# Patient Record
Sex: Female | Born: 1980
Health system: Southern US, Community
[De-identification: ages and names within clinical notes are randomized; demographics above are authoritative.]

## PROBLEM LIST (undated history)

## (undated) DIAGNOSIS — T7840XA Allergy, unspecified, initial encounter: Secondary | ICD-10-CM

## (undated) DIAGNOSIS — Z8489 Family history of other specified conditions: Secondary | ICD-10-CM

## (undated) DIAGNOSIS — K449 Diaphragmatic hernia without obstruction or gangrene: Secondary | ICD-10-CM

## (undated) DIAGNOSIS — I10 Essential (primary) hypertension: Secondary | ICD-10-CM

## (undated) DIAGNOSIS — M1990 Unspecified osteoarthritis, unspecified site: Secondary | ICD-10-CM

## (undated) DIAGNOSIS — F419 Anxiety disorder, unspecified: Secondary | ICD-10-CM

## (undated) HISTORY — DX: Unspecified osteoarthritis, unspecified site: M19.90

## (undated) HISTORY — PX: TONSILLECTOMY: SUR1361

## (undated) HISTORY — PX: NASAL SINUS SURGERY: SHX719

## (undated) HISTORY — DX: Anxiety disorder, unspecified: F41.9

## (undated) HISTORY — DX: Allergy, unspecified, initial encounter: T78.40XA

## (undated) HISTORY — PX: LEEP: SHX91

---

## 2007-07-14 ENCOUNTER — Emergency Department: Payer: Self-pay | Admitting: Emergency Medicine

## 2009-11-13 ENCOUNTER — Other Ambulatory Visit: Admission: RE | Admit: 2009-11-13 | Discharge: 2009-11-13 | Payer: Self-pay | Admitting: Obstetrics & Gynecology

## 2011-01-18 ENCOUNTER — Encounter: Payer: Self-pay | Admitting: *Deleted

## 2011-01-18 DIAGNOSIS — Y92009 Unspecified place in unspecified non-institutional (private) residence as the place of occurrence of the external cause: Secondary | ICD-10-CM | POA: Insufficient documentation

## 2011-01-18 DIAGNOSIS — W108XXA Fall (on) (from) other stairs and steps, initial encounter: Secondary | ICD-10-CM | POA: Insufficient documentation

## 2011-01-18 DIAGNOSIS — F17200 Nicotine dependence, unspecified, uncomplicated: Secondary | ICD-10-CM | POA: Insufficient documentation

## 2011-01-18 DIAGNOSIS — I10 Essential (primary) hypertension: Secondary | ICD-10-CM | POA: Insufficient documentation

## 2011-01-18 DIAGNOSIS — T07XXXA Unspecified multiple injuries, initial encounter: Secondary | ICD-10-CM | POA: Insufficient documentation

## 2011-01-18 DIAGNOSIS — R51 Headache: Secondary | ICD-10-CM | POA: Insufficient documentation

## 2011-01-18 DIAGNOSIS — K449 Diaphragmatic hernia without obstruction or gangrene: Secondary | ICD-10-CM | POA: Insufficient documentation

## 2011-01-18 DIAGNOSIS — H5789 Other specified disorders of eye and adnexa: Secondary | ICD-10-CM | POA: Insufficient documentation

## 2011-01-18 NOTE — ED Notes (Signed)
Pt fell down stairs while carrying laundry. Pt hit right side of face, now c/o headache and facial pain. Pt right eye swollen and bruised.

## 2011-01-19 ENCOUNTER — Emergency Department (HOSPITAL_COMMUNITY)
Admission: EM | Admit: 2011-01-19 | Discharge: 2011-01-19 | Disposition: A | Discharge status: Home / Self Care | Payer: 59 | Attending: Emergency Medicine | Admitting: Emergency Medicine

## 2011-01-19 ENCOUNTER — Emergency Department (HOSPITAL_COMMUNITY): Payer: 59

## 2011-01-19 DIAGNOSIS — W19XXXA Unspecified fall, initial encounter: Secondary | ICD-10-CM

## 2011-01-19 HISTORY — DX: Essential (primary) hypertension: I10

## 2011-01-19 HISTORY — DX: Diaphragmatic hernia without obstruction or gangrene: K44.9

## 2011-01-19 MED ORDER — IBUPROFEN 800 MG PO TABS: 800.0000 mg | ORAL_TABLET | Freq: Three times a day (TID) | ORAL | Status: AC

## 2011-01-19 MED ORDER — HYDROCODONE-ACETAMINOPHEN 5-325 MG PO TABS: 1.0000 | ORAL_TABLET | ORAL | Status: AC | PRN

## 2011-01-19 MED ORDER — IBUPROFEN 800 MG PO TABS
800.0000 mg | ORAL_TABLET | Freq: Once | ORAL | Status: AC
  Administered 2011-01-19: 800 mg via ORAL
  Filled 2011-01-19: qty 1

## 2011-01-19 MED ORDER — HYDROCODONE-ACETAMINOPHEN 5-325 MG PO TABS
1.0000 | ORAL_TABLET | Freq: Once | ORAL | Status: AC
  Administered 2011-01-19: 1 via ORAL
  Filled 2011-01-19: qty 1

## 2011-01-19 NOTE — ED Notes (Signed)
Pt left the ed stating no needs

## 2011-01-19 NOTE — ED Provider Notes (Signed)
History     CSN: [id number] Arrival date & time: 01/19/2011 12:15 AM   First MD Initiated Contact with Patient 01/19/11 0153      Chief Complaint  Patient presents with  . Eye Injury  . Fall  . Headache    (Consider location/radiation/quality/duration/timing/severity/associated sxs/prior treatment) HPI Comments: Seen 39  Patient is a 30 y.o. female presenting with fall. The history is provided by the patient.  Fall The accident occurred 3 to 5 hours ago (Patient fell going down the stairs carrying a laundry basket. Struck the floor. No LOC. ). Incident: walking down stairs. She fell from a height of 6 to 10 ft. She landed on a hard floor. There was no blood loss. The point of impact was the head. The pain is present in the head (right eye and eyebrow). The pain is at a severity of 5/10. The pain is moderate. She was ambulatory at the scene. There was no entrapment after the fall. There was no drug use involved in the accident. There was no alcohol use involved in the accident. Pertinent negatives include no visual change, no numbness, no nausea, no headaches, no hearing loss, no loss of consciousness and no tingling. She has tried ice for the symptoms. The treatment provided no relief.    Past Medical History  Diagnosis Date  . Hypertension   . Hiatal hernia     Past Surgical History  Procedure Date  . Nasal sinus surgery   . Tonsillectomy     Family History  Problem Relation Age of Onset  . Hypertension Mother     History  Substance Use Topics  . Smoking status: Current Everyday Smoker  . Smokeless tobacco: Not on file  . Alcohol Use: Yes     occ    OB History    Grav Para Term Preterm Abortions TAB SAB Ect Mult Living                  Review of Systems  HENT: Positive for facial swelling.        Right eye swollen shut  Gastrointestinal: Negative for nausea.  Neurological: Negative for tingling, loss of consciousness, numbness and headaches.  All other  systems reviewed and are negative.    Allergies  Review of patient's allergies indicates no known allergies.  Home Medications  No current outpatient prescriptions on file.  BP 121/84  Pulse 93  Temp(Src) 98.1 F (36.7 C) (Oral)  Resp 20  Ht 5\' 2"  (1.575 m)  Wt 192 lb (87.091 kg)  BMI 35.12 kg/m2  SpO2 100%  LMP 01/13/2011  Physical Exam  Nursing note and vitals reviewed. Constitutional: She is oriented to person, place, and time. She appears well-developed and well-nourished.  HENT:  Head: Normocephalic.  Right Ear: External ear normal.  Left Ear: External ear normal.  Nose: Nose normal.  Mouth/Throat: Oropharynx is clear and moist.  Eyes: EOM are normal. Pupils are equal, round, and reactive to light.       Swelling to right periorbital area and both upper and lower lids. Bruising to eyebrow. No entrapment. Visual fields intact. No bony abnormalities, no step off, no crepitus.  Neck: Normal range of motion. Neck supple.       No soft tissue tenderness  Cardiovascular: Normal rate, normal heart sounds and intact distal pulses.   Pulmonary/Chest: Effort normal and breath sounds normal.  Abdominal: Soft. Bowel sounds are normal.  Musculoskeletal: Normal range of motion.  Lymphadenopathy:    She has  no cervical adenopathy.  Neurological: She is alert and oriented to person, place, and time. She has normal reflexes.  Skin: Skin is warm and dry.    ED Course  Procedures (including critical care time)  Labs Reviewed - No data to display Ct Head Wo Contrast  01/19/2011  *RADIOLOGY REPORT*  Clinical Data:  Status post fall, with blunt trauma to the right eye; bruising and swelling about the right eye, headache and bilateral neck pain.  CT HEAD WITHOUT CONTRAST CT MAXILLOFACIAL WITHOUT CONTRAST CT CERVICAL SPINE WITHOUT CONTRAST  Technique:  Multidetector CT imaging of the head, cervical spine, and maxillofacial structures were performed using the standard protocol without  intravenous contrast. Multiplanar CT image reconstructions of the cervical spine and maxillofacial structures were also generated.  Comparison:  None  CT HEAD  Findings: There is no evidence of acute infarction, mass lesion, or intra- or extra-axial hemorrhage on CT.  The posterior fossa, including the cerebellum, brainstem and fourth ventricle, is within normal limits.  The third and lateral ventricles, and basal ganglia are unremarkable in appearance.  The cerebral hemispheres are symmetric in appearance, with normal gray- white differentiation.  No mass effect or midline shift is seen.  There is no evidence of fracture; visualized osseous structures are unremarkable in appearance.  The visualized portions of the orbits are within normal limits.  The paranasal sinuses and mastoid air cells are well-aerated.  Soft tissue swelling is noted about the right orbit.  IMPRESSION:  1.  No evidence of traumatic intracranial injury or fracture. 2.  Soft tissue swelling noted about the right orbit.  CT MAXILLOFACIAL  Findings:  There is no evidence of fracture or dislocation.  The maxilla and mandible appear intact.  The nasal bone is unremarkable in appearance.  The visualized dentition demonstrates no acute abnormality.  The orbits are intact bilaterally.  The paranasal sinuses and visualized mastoid air cells are clear.  Soft tissue swelling is noted about the right orbit.  The parapharyngeal fat planes are preserved.  The nasopharynx, oropharynx and hypopharynx are unremarkable in appearance.  The visualized portions of the valleculae and piriform sinuses are grossly unremarkable.  The parotid and submandibular glands are within normal limits.  No cervical lymphadenopathy is seen.  IMPRESSION:  1.  No evidence of fracture or dislocation. 2.  Soft tissue swelling noted about the right orbit.  CT CERVICAL SPINE  Findings:   There is no evidence of fracture or subluxation. Vertebral bodies demonstrate normal height and  alignment. Intervertebral disc spaces are preserved.  Prevertebral soft tissues are within normal limits.  The visualized neural foramina are grossly unremarkable.  The thyroid gland is unremarkable in appearance.  The visualized lung apices are clear.  No significant soft tissue abnormalities are seen.  IMPRESSION: No evidence of fracture or subluxation along the cervical spine.  Original Report Authenticated By: Tonia Ghent, M.D.   Ct Cervical Spine Wo Contrast  01/19/2011  *RADIOLOGY REPORT*  Clinical Data:  Status post fall, with blunt trauma to the right eye; bruising and swelling about the right eye, headache and bilateral neck pain.  CT HEAD WITHOUT CONTRAST CT MAXILLOFACIAL WITHOUT CONTRAST CT CERVICAL SPINE WITHOUT CONTRAST  Technique:  Multidetector CT imaging of the head, cervical spine, and maxillofacial structures were performed using the standard protocol without intravenous contrast. Multiplanar CT image reconstructions of the cervical spine and maxillofacial structures were also generated.  Comparison:  None  CT HEAD  Findings: There is no evidence of acute infarction, mass  lesion, or intra- or extra-axial hemorrhage on CT.  The posterior fossa, including the cerebellum, brainstem and fourth ventricle, is within normal limits.  The third and lateral ventricles, and basal ganglia are unremarkable in appearance.  The cerebral hemispheres are symmetric in appearance, with normal gray- white differentiation.  No mass effect or midline shift is seen.  There is no evidence of fracture; visualized osseous structures are unremarkable in appearance.  The visualized portions of the orbits are within normal limits.  The paranasal sinuses and mastoid air cells are well-aerated.  Soft tissue swelling is noted about the right orbit.  IMPRESSION:  1.  No evidence of traumatic intracranial injury or fracture. 2.  Soft tissue swelling noted about the right orbit.  CT MAXILLOFACIAL  Findings:  There is no  evidence of fracture or dislocation.  The maxilla and mandible appear intact.  The nasal bone is unremarkable in appearance.  The visualized dentition demonstrates no acute abnormality.  The orbits are intact bilaterally.  The paranasal sinuses and visualized mastoid air cells are clear.  Soft tissue swelling is noted about the right orbit.  The parapharyngeal fat planes are preserved.  The nasopharynx, oropharynx and hypopharynx are unremarkable in appearance.  The visualized portions of the valleculae and piriform sinuses are grossly unremarkable.  The parotid and submandibular glands are within normal limits.  No cervical lymphadenopathy is seen.  IMPRESSION:  1.  No evidence of fracture or dislocation. 2.  Soft tissue swelling noted about the right orbit.  CT CERVICAL SPINE  Findings:   There is no evidence of fracture or subluxation. Vertebral bodies demonstrate normal height and alignment. Intervertebral disc spaces are preserved.  Prevertebral soft tissues are within normal limits.  The visualized neural foramina are grossly unremarkable.  The thyroid gland is unremarkable in appearance.  The visualized lung apices are clear.  No significant soft tissue abnormalities are seen.  IMPRESSION: No evidence of fracture or subluxation along the cervical spine.  Original Report Authenticated By: Tonia Ghent, M.D.   Ct Maxillofacial Wo Cm  01/19/2011  *RADIOLOGY REPORT*  Clinical Data:  Status post fall, with blunt trauma to the right eye; bruising and swelling about the right eye, headache and bilateral neck pain.  CT HEAD WITHOUT CONTRAST CT MAXILLOFACIAL WITHOUT CONTRAST CT CERVICAL SPINE WITHOUT CONTRAST  Technique:  Multidetector CT imaging of the head, cervical spine, and maxillofacial structures were performed using the standard protocol without intravenous contrast. Multiplanar CT image reconstructions of the cervical spine and maxillofacial structures were also generated.  Comparison:  None  CT HEAD   Findings: There is no evidence of acute infarction, mass lesion, or intra- or extra-axial hemorrhage on CT.  The posterior fossa, including the cerebellum, brainstem and fourth ventricle, is within normal limits.  The third and lateral ventricles, and basal ganglia are unremarkable in appearance.  The cerebral hemispheres are symmetric in appearance, with normal gray- white differentiation.  No mass effect or midline shift is seen.  There is no evidence of fracture; visualized osseous structures are unremarkable in appearance.  The visualized portions of the orbits are within normal limits.  The paranasal sinuses and mastoid air cells are well-aerated.  Soft tissue swelling is noted about the right orbit.  IMPRESSION:  1.  No evidence of traumatic intracranial injury or fracture. 2.  Soft tissue swelling noted about the right orbit.  CT MAXILLOFACIAL  Findings:  There is no evidence of fracture or dislocation.  The maxilla and mandible appear intact.  The  nasal bone is unremarkable in appearance.  The visualized dentition demonstrates no acute abnormality.  The orbits are intact bilaterally.  The paranasal sinuses and visualized mastoid air cells are clear.  Soft tissue swelling is noted about the right orbit.  The parapharyngeal fat planes are preserved.  The nasopharynx, oropharynx and hypopharynx are unremarkable in appearance.  The visualized portions of the valleculae and piriform sinuses are grossly unremarkable.  The parotid and submandibular glands are within normal limits.  No cervical lymphadenopathy is seen.  IMPRESSION:  1.  No evidence of fracture or dislocation. 2.  Soft tissue swelling noted about the right orbit.  CT CERVICAL SPINE  Findings:   There is no evidence of fracture or subluxation. Vertebral bodies demonstrate normal height and alignment. Intervertebral disc spaces are preserved.  Prevertebral soft tissues are within normal limits.  The visualized neural foramina are grossly unremarkable.   The thyroid gland is unremarkable in appearance.  The visualized lung apices are clear.  No significant soft tissue abnormalities are seen.  IMPRESSION: No evidence of fracture or subluxation along the cervical spine.  Original Report Authenticated By: Tonia Ghent, M.D.       MDM  Patient with fall down stairs carrying a laundry basket. Swelling around the right eye. CT head, maxillofacial and cervical spine negative for fractures, bleeding. Shows soft tissue swelling.Vision intact. Patient ambulatory in the department. No other physical signs of trauma.She has taken PO fluids. She has eaten a snack.The patient appears reasonably screened and/or stabilized for discharge and I doubt any other medical condition or other [facility] requiring further screening, evaluation, or treatment in the ED at this time prior to discharge. MDM Reviewed: nursing note and vitals Interpretation: CT scan           Nicoletta Dress. Colon Branch, MD 01/19/11 [phone]

## 2011-12-27 DIAGNOSIS — Z01419 Encounter for gynecological examination (general) (routine) without abnormal findings: Secondary | ICD-10-CM | POA: Insufficient documentation

## 2011-12-30 ENCOUNTER — Other Ambulatory Visit (HOSPITAL_COMMUNITY)
Admission: RE | Admit: 2011-12-30 | Discharge: 2011-12-30 | Disposition: A | Discharge status: Home / Self Care | Payer: Managed Care, Other (non HMO) | Source: Ambulatory Visit | Attending: Obstetrics & Gynecology | Admitting: Obstetrics & Gynecology

## 2014-02-01 DIAGNOSIS — E66813 Obesity, class 3: Secondary | ICD-10-CM | POA: Insufficient documentation

## 2014-02-01 DIAGNOSIS — K449 Diaphragmatic hernia without obstruction or gangrene: Secondary | ICD-10-CM | POA: Insufficient documentation

## 2014-02-01 DIAGNOSIS — K219 Gastro-esophageal reflux disease without esophagitis: Secondary | ICD-10-CM | POA: Insufficient documentation

## 2014-06-02 DIAGNOSIS — N939 Abnormal uterine and vaginal bleeding, unspecified: Secondary | ICD-10-CM | POA: Insufficient documentation

## 2014-08-04 DIAGNOSIS — F17200 Nicotine dependence, unspecified, uncomplicated: Secondary | ICD-10-CM | POA: Insufficient documentation

## 2016-10-16 ENCOUNTER — Emergency Department
Admission: EM | Admit: 2016-10-16 | Discharge: 2016-10-16 | Disposition: A | Discharge status: Home / Self Care | Payer: Self-pay | Attending: Emergency Medicine | Admitting: Emergency Medicine

## 2016-10-16 ENCOUNTER — Emergency Department: Payer: Self-pay

## 2016-10-16 DIAGNOSIS — F17200 Nicotine dependence, unspecified, uncomplicated: Secondary | ICD-10-CM | POA: Insufficient documentation

## 2016-10-16 DIAGNOSIS — R0789 Other chest pain: Secondary | ICD-10-CM | POA: Insufficient documentation

## 2016-10-16 DIAGNOSIS — R079 Chest pain, unspecified: Secondary | ICD-10-CM

## 2016-10-16 DIAGNOSIS — I10 Essential (primary) hypertension: Secondary | ICD-10-CM | POA: Insufficient documentation

## 2016-10-16 DIAGNOSIS — K8020 Calculus of gallbladder without cholecystitis without obstruction: Secondary | ICD-10-CM | POA: Insufficient documentation

## 2016-10-16 LAB — TROPONIN I: Troponin I: <0.03 ng/mL (ref <0.03)

## 2016-10-16 LAB — BASIC METABOLIC PANEL
ANION GAP: 11 (ref 5–15)
BUN: 11 mg/dL (ref 6–20)
CALCIUM: 9 mg/dL (ref 8.9–10.3)
CO2: 20 mmol/L — ABNORMAL LOW (ref 22–32)
Chloride: 106 mmol/L (ref 101–111)
Creatinine, Ser: 0.71 mg/dL (ref 0.44–1.00)
Glucose, Bld: 131 mg/dL — ABNORMAL HIGH (ref 65–99)
Potassium: 3.5 mmol/L (ref 3.5–5.1)
Sodium: 137 mmol/L (ref 135–145)

## 2016-10-16 LAB — CBC
HCT: 43.6 % (ref 35.0–47.0)
HEMOGLOBIN: 15 g/dL (ref 12.0–16.0)
MCH: 30.5 pg (ref 26.0–34.0)
MCHC: 34.4 g/dL (ref 32.0–36.0)
MCV: 88.5 fL (ref 80.0–100.0)
Platelets: 229 10*3/uL (ref 150–440)
RBC: 4.93 MIL/uL (ref 3.80–5.20)
RDW: 13.1 % (ref 11.5–14.5)
WBC: 10.8 10*3/uL (ref 3.6–11.0)

## 2016-10-16 LAB — HEPATIC FUNCTION PANEL
ALT: 22 U/L (ref 14–54)
AST: 22 U/L (ref 15–41)
Albumin: 4.1 g/dL (ref 3.5–5.0)
Alkaline Phosphatase: 90 U/L (ref 38–126)
Bilirubin, Direct: <0.1 mg/dL — ABNORMAL LOW (ref 0.1–0.5)
TOTAL PROTEIN: 6.8 g/dL (ref 6.5–8.1)
Total Bilirubin: 0.5 mg/dL (ref 0.3–1.2)

## 2016-10-16 LAB — LIPASE, BLOOD: Lipase: 47 U/L (ref 11–51)

## 2016-10-16 MED ORDER — GI COCKTAIL ~~LOC~~
30.0000 mL | ORAL | Status: AC
  Administered 2016-10-16: 30 mL via ORAL

## 2016-10-16 MED ORDER — GI COCKTAIL ~~LOC~~
ORAL | Status: AC
  Administered 2016-10-16: 30 mL via ORAL
  Filled 2016-10-16: qty 30

## 2016-10-16 MED ORDER — FAMOTIDINE 20 MG PO TABS
40.0000 mg | ORAL_TABLET | Freq: Once | ORAL | Status: AC
  Administered 2016-10-16: 40 mg via ORAL

## 2016-10-16 MED ORDER — FAMOTIDINE 20 MG PO TABS
ORAL_TABLET | ORAL | Status: AC
  Filled 2016-10-16: qty 1

## 2016-10-16 MED ORDER — FAMOTIDINE 20 MG PO TABS
ORAL_TABLET | ORAL | Status: AC
  Administered 2016-10-16: 40 mg via ORAL
  Filled 2016-10-16: qty 1

## 2016-10-16 NOTE — ED Notes (Signed)
Pt and family not seen in room. Dr. Joni Fears notified.

## 2016-10-16 NOTE — ED Provider Notes (Signed)
[facility] Emergency Department Provider Note  ____________________________________________  Time seen: Approximately 4:39 PM  I have reviewed the triage vital signs and the nursing notes.   HISTORY  Chief Complaint Chest Pain    HPI Sydney Valenzuela is a 36 y.o. female reports at about 1 PM today, she was eating a chicken salad at [employer] [facility], she had sudden onset of mid chest pain just to the left of the sternum. Radiating to the left shoulder, nonexertional nonpleuritic, better laying down, worse sitting up. No vomiting diaphoresis or dizziness. No shortness of breath. Never had anything like this before. It was initially severe, now has mild to moderate intensity.  Has a history of hiatal hernia, takes omeprazole daily.   Past Medical History:  Diagnosis Date  . Hiatal hernia   . Hypertension      There are no active problems to display for this patient.    Past Surgical History:  Procedure Laterality Date  . NASAL SINUS SURGERY    . TONSILLECTOMY       Prior to Admission medications   Not on File  Omeprazole   Allergies Patient has no known allergies.   Family History  Problem Relation Age of Onset  . Hypertension Mother     Social History Social History  Substance Use Topics  . Smoking status: Current Every Day Smoker  . Smokeless tobacco: Not on file  . Alcohol use Yes     Comment: occ    Review of Systems  Constitutional:   No fever or chills.  ENT:   No sore throat. No rhinorrhea. Cardiovascular:   Positive as above for chest pain without syncope. Respiratory:   No dyspnea or cough. Gastrointestinal:   Negative for abdominal pain, vomiting and diarrhea.  Musculoskeletal:   Negative for focal pain or swelling All other systems reviewed and are negative except as documented above in ROS and HPI.  ____________________________________________   PHYSICAL EXAM:  VITAL SIGNS: ED Triage Vitals  Enc Vitals Group   BP 10/16/16 1326 121/75     Pulse Rate 10/16/16 1326 79     Resp 10/16/16 1326 (!) 22     Temp 10/16/16 1326 98 F (36.7 C)     Temp Source 10/16/16 1326 Oral     SpO2 10/16/16 1326 98 %     Weight 10/16/16 1328 200 lb (90.7 kg)     Height 10/16/16 1328 5\' 1"  (1.549 m)     Head Circumference --      Peak Flow --      Pain Score 10/16/16 1326 7     Pain Loc --      Pain Edu? --      Excl. in [place]? --     Vital signs reviewed, nursing assessments reviewed.   Constitutional:   Alert and oriented. Well appearing and in no distress. Eyes:   No scleral icterus.  EOMI. No nystagmus. No conjunctival pallor. PERRL. ENT   Head:   Normocephalic and atraumatic.   Nose:   No congestion/rhinnorhea.    Mouth/Throat:   MMM, positive pharyngeal erythema. No peritonsillar mass.    Neck:   No meningismus. Full ROM Hematological/Lymphatic/Immunilogical:   No cervical lymphadenopathy. Cardiovascular:   RRR. Symmetric bilateral radial and DP pulses.  No murmurs.  Respiratory:   Normal respiratory effort without tachypnea/retractions. Breath sounds are clear and equal bilaterally. No wheezes/rales/rhonchi. Gastrointestinal:   Soft with significant right upper quadrant tenderness. Non distended. There is no CVA tenderness.  No rebound, rigidity, or guarding. Genitourinary:   deferred Musculoskeletal:   Normal range of motion in all extremities. No joint effusions.  No lower extremity tenderness.  No edema chest wall tender to the touch in the indicated area pain, reproducing the symptoms.. Neurologic:   Normal speech and language.  Motor grossly intact. No gross focal neurologic deficits are appreciated.  Skin:    Skin is warm, dry and intact. No rash noted.  No petechiae, purpura, or bullae.  ____________________________________________    LABS (pertinent positives/negatives) (all labs ordered are listed, but only abnormal results are displayed) Labs Reviewed  BASIC METABOLIC PANEL -  Abnormal; Notable for the following:       Result Value   CO2 20 (*)    Glucose, Bld 131 (*)    All other components within normal limits  HEPATIC FUNCTION PANEL - Abnormal; Notable for the following:    Bilirubin, Direct <0.1 (*)    All other components within normal limits  CBC  TROPONIN I  LIPASE, BLOOD  TROPONIN I   ____________________________________________   EKG  Interpreted by me  Date: 10/16/2016  Rate: 81  Rhythm: normal sinus rhythm  QRS Axis: normal  Intervals: normal  ST/T Wave abnormalities: normal  Conduction Disutrbances: none  Narrative Interpretation: unremarkable      ____________________________________________    RADIOLOGY  Dg Chest 2 View  Result Date: 10/16/2016 CLINICAL DATA:  Left chest pain EXAM: CHEST  2 VIEW COMPARISON:  None. FINDINGS: The heart size and mediastinal contours are within normal limits. Both lungs are clear. The visualized skeletal structures are unremarkable. IMPRESSION: No active cardiopulmonary disease. Electronically Signed   By: Franchot Gallo M.D.   On: 10/16/2016 14:04   US Abdomen Limited Ruq  Result Date: 10/16/2016 CLINICAL DATA:  Epigastric and central chest pain while eating. Right upper quadrant tenderness for 3 hours. EXAM: ULTRASOUND ABDOMEN LIMITED RIGHT UPPER QUADRANT COMPARISON:  None. FINDINGS: Gallbladder: Contracted gallbladder with a questionable 5 mm stone. No wall thickening. No sonographic Murphy sign noted by sonographer. Common bile duct: Diameter: 2 mm Liver: Mildly increased echogenicity diffusely without focal lesion identified. IMPRESSION: 1. Contracted gallbladder with questionable small stone. No evidence of acute cholecystitis. 2. Slightly echogenic liver which may reflect steatosis. Electronically Signed   By: Logan Bores M.D.   On: 10/16/2016 16:43    ____________________________________________   PROCEDURES Procedures  ____________________________________________   INITIAL  IMPRESSION / ASSESSMENT AND PLAN / ED COURSE  Pertinent labs & imaging results that were available during my care of the patient were reviewed by me and considered in my medical decision making (see chart for details).  Patient presents with central chest pain. Somewhat atypical symptom pattern, differential includes biliary disease, pancreatitis, esophageal spasm or GERD, or chest wall pain.Considering the patient's symptoms, medical history, and physical examination today, I have low suspicion for ACS, PE, TAD, pneumothorax, carditis, mediastinitis, pneumonia, CHF, or sepsis.  Check LFTs and lipase. Check ultrasound right upper quadrant. If negative, would use maximal acid suppression therapy and have her follow up with primary care. We'll recheck a serial troponin at 4 hour mark from her symptom onset.  Clinical Course as of Oct 17 2030  Wed Oct 16, 2016  1809 No cholecystitis. ?gallstone. Will PO trial.   [PS]    Clinical Course User Index [PS] Carrie Mew, MD    ----------------------------------------- 8:27 PM on 10/16/2016 ----------------------------------------- Workup completed, significant for a likely 5 mm gallstone without evidence of obstruction. While obtaining the  ultrasound, I had planned to get a repeat troponin completed at a 4 hour interval from patient symptom onset which would've been at 5:00 PM. However, apparently the lab canceled this troponin without notifying us, creating a delay in nursing realizing that this test was needed. We did obtain a new sample and sent to the lab, but there was a significant delay to them actually running a sample, not until at least 7:30 PM. By the time the result was obtained and was negative and was ready to update the patient, she had eloped from the emergency department. I was not able to counsel her on the finding of a gallstone after the ultrasound, but upon initial assessment I had warned her of this concern given the symptom  onset while eating. She has medical decision-making capacity to make this decision to elope.    ____________________________________________   FINAL CLINICAL IMPRESSION(S) / ED DIAGNOSES  Final diagnoses:  Nonspecific chest pain  Calculus of gallbladder without cholecystitis without obstruction      There are no discharge medications for this patient.    Portions of this note were generated with dragon dictation software. Dictation errors may occur despite best attempts at proofreading.    Carrie Mew, MD 10/16/16 2032

## 2016-10-16 NOTE — ED Notes (Signed)
Pt left before discharge paperwork printed and reviewed.

## 2016-10-16 NOTE — ED Triage Notes (Signed)
Pt presents to ED for L CP that began about 20 min PTA. Pt states sometimes radiation to back and some times mild radiation to L arm but not at present. Pt states a lot of stress currently. States dizziness. Alert, oriented. Appears uncomfortable.

## 2016-10-16 NOTE — ED Notes (Signed)
Patient tolerating water.

## 2016-11-01 ENCOUNTER — Encounter: Payer: Self-pay | Admitting: *Deleted

## 2016-11-04 ENCOUNTER — Ambulatory Visit
Admission: RE | Admit: 2016-11-04 | Discharge: 2016-11-04 | Disposition: A | Discharge status: Home / Self Care | Payer: PRIVATE HEALTH INSURANCE | Source: Ambulatory Visit | Attending: Unknown Physician Specialty | Admitting: Unknown Physician Specialty

## 2016-11-04 ENCOUNTER — Ambulatory Visit: Payer: PRIVATE HEALTH INSURANCE | Admitting: Anesthesiology

## 2016-11-04 ENCOUNTER — Encounter
Admission: RE | Disposition: A | Discharge status: Home / Self Care | Payer: Self-pay | Source: Ambulatory Visit | Attending: Unknown Physician Specialty

## 2016-11-04 DIAGNOSIS — R1310 Dysphagia, unspecified: Secondary | ICD-10-CM | POA: Diagnosis not present

## 2016-11-04 DIAGNOSIS — I10 Essential (primary) hypertension: Secondary | ICD-10-CM | POA: Insufficient documentation

## 2016-11-04 DIAGNOSIS — K210 Gastro-esophageal reflux disease with esophagitis: Secondary | ICD-10-CM | POA: Diagnosis not present

## 2016-11-04 DIAGNOSIS — K449 Diaphragmatic hernia without obstruction or gangrene: Secondary | ICD-10-CM | POA: Insufficient documentation

## 2016-11-04 DIAGNOSIS — Z8249 Family history of ischemic heart disease and other diseases of the circulatory system: Secondary | ICD-10-CM | POA: Insufficient documentation

## 2016-11-04 DIAGNOSIS — Z79899 Other long term (current) drug therapy: Secondary | ICD-10-CM | POA: Insufficient documentation

## 2016-11-04 DIAGNOSIS — F17200 Nicotine dependence, unspecified, uncomplicated: Secondary | ICD-10-CM | POA: Insufficient documentation

## 2016-11-04 DIAGNOSIS — R1011 Right upper quadrant pain: Secondary | ICD-10-CM | POA: Diagnosis not present

## 2016-11-04 HISTORY — PX: ESOPHAGOGASTRODUODENOSCOPY (EGD) WITH PROPOFOL: SHX5813

## 2016-11-04 LAB — POCT PREGNANCY, URINE: PREG TEST UR: NEGATIVE

## 2016-11-04 SURGERY — ESOPHAGOGASTRODUODENOSCOPY (EGD) WITH PROPOFOL
Anesthesia: General

## 2016-11-04 MED ORDER — SODIUM CHLORIDE 0.9 % IV SOLN: INTRAVENOUS | Status: DC

## 2016-11-04 MED ORDER — LIDOCAINE HCL (PF) 1 % IJ SOLN
INTRAMUSCULAR | Status: AC
  Administered 2016-11-04: 0.3 mL via INTRADERMAL
  Filled 2016-11-04: qty 2

## 2016-11-04 MED ORDER — PROPOFOL 500 MG/50ML IV EMUL
INTRAVENOUS | Status: DC | PRN
  Administered 2016-11-04: 150 ug/kg/min via INTRAVENOUS

## 2016-11-04 MED ORDER — MIDAZOLAM HCL 2 MG/2ML IJ SOLN
INTRAMUSCULAR | Status: AC
  Filled 2016-11-04: qty 2

## 2016-11-04 MED ORDER — IPRATROPIUM-ALBUTEROL 0.5-2.5 (3) MG/3ML IN SOLN
RESPIRATORY_TRACT | Status: AC
  Administered 2016-11-04: 3 mL via RESPIRATORY_TRACT
  Filled 2016-11-04: qty 3

## 2016-11-04 MED ORDER — LIDOCAINE HCL (PF) 1 % IJ SOLN
2.0000 mL | Freq: Once | INTRAMUSCULAR | Status: AC
  Administered 2016-11-04: 0.3 mL via INTRADERMAL

## 2016-11-04 MED ORDER — LIDOCAINE HCL (CARDIAC) 20 MG/ML IV SOLN
INTRAVENOUS | Status: DC | PRN
  Administered 2016-11-04: 50 mg via INTRAVENOUS

## 2016-11-04 MED ORDER — IPRATROPIUM-ALBUTEROL 0.5-2.5 (3) MG/3ML IN SOLN
3.0000 mL | Freq: Once | RESPIRATORY_TRACT | Status: AC
  Administered 2016-11-04: 3 mL via RESPIRATORY_TRACT

## 2016-11-04 MED ORDER — MIDAZOLAM HCL 2 MG/2ML IJ SOLN
INTRAMUSCULAR | Status: DC | PRN
  Administered 2016-11-04: 2 mg via INTRAVENOUS

## 2016-11-04 MED ORDER — PROPOFOL 10 MG/ML IV BOLUS
INTRAVENOUS | Status: DC | PRN
  Administered 2016-11-04: 100 mg via INTRAVENOUS

## 2016-11-04 MED ORDER — SODIUM CHLORIDE 0.9 % IV SOLN
INTRAVENOUS | Status: DC
  Administered 2016-11-04: 1000 mL via INTRAVENOUS

## 2016-11-04 NOTE — Transfer of Care (Signed)
Immediate Anesthesia Transfer of Care Note  Patient: Sydney Valenzuela  Procedure(s) Performed: Procedure(s): ESOPHAGOGASTRODUODENOSCOPY (EGD) WITH PROPOFOL (N/A)  Patient Location: PACU  Anesthesia Type:General  Level of Consciousness: awake and alert   Airway & Oxygen Therapy: Patient Spontanous Breathing and Patient connected to nasal cannula oxygen  Post-op Assessment: Report given to RN and Post -op Vital signs reviewed and stable  Post vital signs: Reviewed and stable  Last Vitals:  Vitals:   11/04/16 1335 11/04/16 1341  BP: 127/74   Pulse: 86 87  Resp: (!) 9 14  Temp: 36.7 C     Last Pain:  Vitals:   11/04/16 1335  TempSrc: Tympanic  PainSc:          Complications: No apparent anesthesia complications

## 2016-11-04 NOTE — H&P (Signed)
   Primary Care Physician:  Patient, No Pcp Per Primary Gastroenterologist:  Dr. Vira Agar  Pre-Procedure History & Physical: HPI:  Sydney Valenzuela is a 36 y.o. female is here for an endoscopy.   Past Medical History:  Diagnosis Date  . Hiatal hernia   . Hypertension     Past Surgical History:  Procedure Laterality Date  . NASAL SINUS SURGERY    . TONSILLECTOMY      Prior to Admission medications   Medication Sig Start Date End Date Taking? Authorizing Provider  acetaminophen (TYLENOL) 325 MG tablet Take 650 mg by mouth every 4 (four) hours as needed.   Yes [provider]  pantoprazole (PROTONIX) 40 MG tablet Take 40 mg by mouth 2 (two) times daily.   Yes [provider]  sucralfate (CARAFATE) 1 g tablet Take 1 g by mouth 2 (two) times daily.   Yes [provider]    Allergies as of 10/31/2016  . (No Known Allergies)    Family History  Problem Relation Age of Onset  . Hypertension Mother     Social History   Social History  . Marital status: Married    Spouse name: N/A  . Number of children: N/A  . Years of education: N/A   Occupational History  . Not on file.   Social History Main Topics  . Smoking status: Current Every Day Smoker  . Smokeless tobacco: Never Used  . Alcohol use Yes     Comment: occ  . Drug use: No  . Sexual activity: Not on file   Other Topics Concern  . Not on file   Social History Narrative  . No narrative on file    Review of Systems: See HPI, otherwise negative ROS  Physical Exam: BP 108/70   Pulse 68   Temp 98.4 F (36.9 C) (Tympanic)   Resp 17   Ht 5\' 2"  (1.575 m)   Wt 102.1 kg (225 lb)   SpO2 99%   BMI 41.15 kg/m  General:   Alert,  pleasant and cooperative in NAD Head:  Normocephalic and atraumatic. Neck:  Supple; no masses or thyromegaly. Lungs:  Clear throughout to auscultation.    Heart:  Regular rate and rhythm. Abdomen:  Soft, nontender and nondistended. Normal bowel sounds, without  guarding, and without rebound.   Neurologic:  Alert and  oriented x4;  grossly normal neurologically.  Impression/Plan: Sydney Valenzuela is here for an endoscopy to be performed for dysphagia and RUQ abdominal pain.  Risks, benefits, limitations, and alternatives regarding  endoscopy have been reviewed with the patient.  Questions have been answered.  All parties agreeable.   Gaylyn Cheers, MD  11/04/2016, 1:17 PM

## 2016-11-04 NOTE — Anesthesia Procedure Notes (Signed)
Performed by: Damaria Stofko Pre-anesthesia Checklist: Patient identified, Emergency Drugs available, Suction available, Patient being monitored and Timeout performed Patient Re-evaluated:Patient Re-evaluated prior to induction Oxygen Delivery Method: Nasal cannula Preoxygenation: Pre-oxygenation with 100% oxygen Induction Type: IV induction       

## 2016-11-04 NOTE — Anesthesia Preprocedure Evaluation (Signed)
Anesthesia Evaluation  Patient identified by MRN, date of birth, ID band Patient awake    Reviewed: Allergy & Precautions, NPO status , Patient's Chart, lab work & pertinent test results  Airway Mallampati: II       Dental  (+) Teeth Intact   Pulmonary COPD, Current Smoker,    breath sounds clear to auscultation       Cardiovascular Exercise Tolerance: Good hypertension,  Rhythm:Regular Rate:Normal     Neuro/Psych negative psych ROS   GI/Hepatic Neg liver ROS, hiatal hernia,   Endo/Other  negative endocrine ROS  Renal/GU negative Renal ROS     Musculoskeletal negative musculoskeletal ROS (+)   Abdominal (+) + obese,   Peds negative pediatric ROS (+)  Hematology negative hematology ROS (+)   Anesthesia Other Findings   Reproductive/Obstetrics                             Anesthesia Physical Anesthesia Plan  ASA: II  Anesthesia Plan: General   Post-op Pain Management:    Induction: Intravenous  PONV Risk Score and Plan: 0  Airway Management Planned: Natural Airway and Nasal Cannula  Additional Equipment:   Intra-op Plan:   Post-operative Plan:   Informed Consent: I have reviewed the patients History and Physical, chart, labs and discussed the procedure including the risks, benefits and alternatives for the proposed anesthesia with the patient or authorized representative who has indicated his/her understanding and acceptance.     Plan Discussed with: CRNA  Anesthesia Plan Comments:         Anesthesia Quick Evaluation

## 2016-11-04 NOTE — Anesthesia Post-op Follow-up Note (Cosign Needed)
Anesthesia QCDR form completed.        

## 2016-11-04 NOTE — Op Note (Signed)
[facility] Gastroenterology Patient Name: Sydney Valenzuela Procedure Date: 11/04/2016 12:54 PM MRN: [id number] Account #: [id number] Date of Birth: [birth year] Admit Type: Outpatient Age: 36 Room: [facility] ENDO ROOM 3 Gender: Female Note Status: Finalized Procedure:            Upper GI endoscopy Indications:          Abdominal pain in the right upper quadrant, Dysphagia,                        Heartburn Providers:            Manya Silvas, MD Referring MD:         No Local Md, MD (Referring MD) Medicines:            Propofol per Anesthesia Complications:        No immediate complications. Procedure:            Pre-Anesthesia Assessment:                       - After reviewing the risks and benefits, the patient                        was deemed in satisfactory condition to undergo the                        procedure.                       After obtaining informed consent, the endoscope was                        passed under direct vision. Throughout the procedure,                        the patient's blood pressure, pulse, and oxygen                        saturations were monitored continuously. The Endoscope                        was introduced through the mouth, and advanced to the                        second part of duodenum. The upper GI endoscopy was                        accomplished without difficulty. The patient tolerated                        the procedure fairly well. Findings:      LA Grade B (one or more mucosal breaks greater than 5 mm, not extending       between the tops of two mucosal folds) esophagitis with no bleeding was       found 33 cm from the incisors. Biopsies were taken with a cold forceps       for histology.      A small-medium hiatal hernia was present. The rest of the stomach was       normal.      The examined duodenum was normal. Impression:           -  LA Grade B reflux esophagitis. Rule out Barrett's   esophagus. Biopsied.                       - Small hiatal hernia.                       - Normal examined duodenum. Recommendation:       - Await pathology results. Increase Pantoprazole to                        twice a day, prefer before meals. Keep appointment for                        gall bladder problem. Manya Silvas, MD 11/04/2016 1:36:54 PM This report has been signed electronically. Number of Addenda: 0 Note Initiated On: 11/04/2016 12:54 PM      [facility]

## 2016-11-05 ENCOUNTER — Encounter: Payer: Self-pay | Admitting: *Deleted

## 2016-11-05 ENCOUNTER — Encounter: Payer: Self-pay | Admitting: Unknown Physician Specialty

## 2016-11-05 LAB — SURGICAL PATHOLOGY

## 2016-11-05 NOTE — Anesthesia Postprocedure Evaluation (Signed)
Anesthesia Post Note  Patient: Sydney Valenzuela  Procedure(s) Performed: Procedure(s) (LRB): ESOPHAGOGASTRODUODENOSCOPY (EGD) WITH PROPOFOL (N/A)  Patient location during evaluation: PACU Anesthesia Type: General Level of consciousness: awake Pain management: pain level controlled Vital Signs Assessment: post-procedure vital signs reviewed and stable Respiratory status: nonlabored ventilation Cardiovascular status: stable Anesthetic complications: no     Last Vitals:  Vitals:   11/04/16 1355 11/04/16 1405  BP: 117/78 121/70  Pulse: 75 64  Resp: (!) 25 10  Temp:      Last Pain:  Vitals:   11/04/16 1335  TempSrc: Tympanic  PainSc:                  VAN STAVEREN,[place]on [place]

## 2016-11-06 ENCOUNTER — Ambulatory Visit (INDEPENDENT_AMBULATORY_CARE_PROVIDER_SITE_OTHER): Payer: PRIVATE HEALTH INSURANCE | Admitting: General Surgery

## 2016-11-06 ENCOUNTER — Encounter: Payer: Self-pay | Admitting: General Surgery

## 2016-11-06 VITALS — BP 128/73 | Resp 12 | Ht 62.0 in | Wt 225.0 lb

## 2016-11-06 DIAGNOSIS — R1084 Generalized abdominal pain: Secondary | ICD-10-CM

## 2016-11-06 DIAGNOSIS — R1011 Right upper quadrant pain: Secondary | ICD-10-CM | POA: Diagnosis not present

## 2016-11-06 NOTE — Patient Instructions (Signed)
Schedule CT scan of abdomen, elective cholecystectomy    Laparoscopic Cholecystectomy Laparoscopic cholecystectomy is surgery to remove the gallbladder. The gallbladder is a pear-shaped organ that lies beneath the liver on the right side of the body. The gallbladder stores bile, which is a fluid that helps the body to digest fats. Cholecystectomy is often done for inflammation of the gallbladder (cholecystitis). This condition is usually caused by a buildup of gallstones (cholelithiasis) in the gallbladder. Gallstones can block the flow of bile, which can result in inflammation and pain. In severe cases, emergency surgery may be required. This procedure is done though small incisions in your abdomen (laparoscopic surgery). A thin scope with a camera (laparoscope) is inserted through one incision. Thin surgical instruments are inserted through the other incisions. In some cases, a laparoscopic procedure may be turned into a type of surgery that is done through a larger incision (open surgery). Tell a health care provider about:  Any allergies you have.  All medicines you are taking, including vitamins, herbs, eye drops, creams, and over-the-counter medicines.  Any problems you or family members have had with anesthetic medicines.  Any blood disorders you have.  Any surgeries you have had.  Any medical conditions you have.  Whether you are pregnant or may be pregnant. What are the risks? Generally, this is a safe procedure. However, problems may occur, including:  Infection.  Bleeding.  Allergic reactions to medicines.  Damage to other structures or organs.  A stone remaining in the common bile duct. The common bile duct carries bile from the gallbladder into the small intestine.  A bile leak from the cyst duct that is clipped when your gallbladder is removed.  What happens before the procedure? Staying hydrated Follow instructions from your health care provider about hydration,  which may include:  Up to 2 hours before the procedure - you may continue to drink clear liquids, such as water, clear fruit juice, black coffee, and plain tea.  Eating and drinking restrictions Follow instructions from your health care provider about eating and drinking, which may include:  8 hours before the procedure - stop eating heavy meals or foods such as meat, fried foods, or fatty foods.  6 hours before the procedure - stop eating light meals or foods, such as toast or cereal.  6 hours before the procedure - stop drinking milk or drinks that contain milk.  2 hours before the procedure - stop drinking clear liquids.  Medicines  Ask your health care provider about: ? Changing or stopping your regular medicines. This is especially important if you are taking diabetes medicines or blood thinners. ? Taking medicines such as aspirin and ibuprofen. These medicines can thin your blood. Do not take these medicines before your procedure if your health care provider instructs you not to.  You may be given antibiotic medicine to help prevent infection. General instructions  Let your health care provider know if you develop a cold or an infection before surgery.  Plan to have someone take you home from the hospital or clinic.  Ask your health care provider how your surgical site will be marked or identified. What happens during the procedure?  To reduce your risk of infection: ? Your health care team will wash or sanitize their hands. ? Your skin will be washed with soap. ? Hair may be removed from the surgical area.  An IV tube may be inserted into one of your veins.  You will be given one or more of  the following: ? A medicine to help you relax (sedative). ? A medicine to make you fall asleep (general anesthetic).  A breathing tube will be placed in your mouth.  Your surgeon will make several small cuts (incisions) in your abdomen.  The laparoscope will be inserted through  one of the small incisions. The camera on the laparoscope will send images to a TV screen (monitor) in the operating room. This lets your surgeon see inside your abdomen.  Air-like gas will be pumped into your abdomen. This will expand your abdomen to give the surgeon more room to perform the surgery.  Other tools that are needed for the procedure will be inserted through the other incisions. The gallbladder will be removed through one of the incisions.  Your common bile duct may be examined. If stones are found in the common bile duct, they may be removed.  After your gallbladder has been removed, the incisions will be closed with stitches (sutures), staples, or skin glue.  Your incisions may be covered with a bandage (dressing). The procedure may vary among health care providers and hospitals. What happens after the procedure?  Your blood pressure, heart rate, breathing rate, and blood oxygen level will be monitored until the medicines you were given have worn off.  You will be given medicines as needed to control your pain.  Do not drive for 24 hours if you were given a sedative. This information is not intended to replace advice given to you by your health care provider. Make sure you discuss any questions you have with your health care provider. Document Released: 03/18/2005 Document Revised: 10/08/2015 Document Reviewed: 09/04/2015 Elsevier Interactive Patient Education  2018 [employer].

## 2016-11-06 NOTE — Progress Notes (Signed)
Patient ID: Sydney Valenzuela, female   DOB: 08-14-1980, 36 y.o.   MRN: [id number]  Chief Complaint  Patient presents with  . Abdominal Pain    HPI Sydney Valenzuela is a 36 y.o. female is here today for an evaluation of abdominal pain. Patient states the pain started about one year ago, has gotten worse in the past 3-4 weeks.  Patient had an abdominal ultrasound done on 10/16/16. She states the pain is located in her upper right abdominal quadrant and extends to her back. Positive for nausea. She does have diarrhea-sometimes 20 times a day-denies blood in her stool positive for mucous. Had an upper endoscopy done on 11/04/16-she states showed inflammation and checking for H. Pylori testing was negative. She states she does have hiatal hernia found on endoscopy. HPI  Past Medical History:  Diagnosis Date  . Hiatal hernia   . Hypertension     Past Surgical History:  Procedure Laterality Date  . ESOPHAGOGASTRODUODENOSCOPY (EGD) WITH PROPOFOL N/A 11/04/2016   Procedure: ESOPHAGOGASTRODUODENOSCOPY (EGD) WITH PROPOFOL;  Surgeon: Manya Silvas, MD;  Location: [facility] ENDOSCOPY;  Service: Endoscopy;  Laterality: N/A;  . NASAL SINUS SURGERY    . TONSILLECTOMY      Family History  Problem Relation Age of Onset  . Hypertension Mother     Social History Social History  Substance Use Topics  . Smoking status: Current Every Day Smoker  . Smokeless tobacco: Never Used  . Alcohol use Yes     Comment: occ    Allergies  Allergen Reactions  . Codeine Itching    Current Outpatient Prescriptions  Medication Sig Dispense Refill  . acetaminophen (TYLENOL) 325 MG tablet Take 650 mg by mouth every 4 (four) hours as needed.    . NON FORMULARY 250 mg.    . pantoprazole (PROTONIX) 40 MG tablet Take 40 mg by mouth 2 (two) times daily.    . sucralfate (CARAFATE) 1 g tablet Take 1 g by mouth 2 (two) times daily.     No current facility-administered medications for this visit.     Review of  Systems Review of Systems  Constitutional: Negative.   Respiratory: Negative.   Cardiovascular: Negative.   Gastrointestinal: Positive for abdominal distention, abdominal pain, diarrhea, nausea and vomiting.    Blood pressure 128/73, resp. rate 12, height _0  (1.575 m), weight 225 lb (102.1 kg).  Physical Exam Physical Exam  Constitutional: She is oriented to person, place, and time. She appears well-developed and well-nourished.  Eyes: Conjunctivae are normal. No scleral icterus.  Neck: Neck supple.  Cardiovascular: Normal rate, regular rhythm and normal heart sounds.   Pulmonary/Chest: Effort normal and breath sounds normal.  Abdominal: Soft. Bowel sounds are normal. There is tenderness in the right upper quadrant. There is negative Murphy's sign.    Lymphadenopathy:    She has no cervical adenopathy.  Neurological: She is alert and oriented to person, place, and time.  Skin: Skin is warm and dry.    Data Reviewed Notes reviewed Recent labs- CBC, lipase, met B normal.  Endoscopy- cold biopsy of esophagus negative for dysplasia or malignancy Gallbladder US- contracted gallbladder with questionable 5 mm stone, no wall thickening   Assessment    Abdominal pain - chronic issue that seems to be getting worse. Labs, endoscopy, and imaging of gallbladder noted -contracted gallbladder with a questionable stone. The US findings are not consistent with the significant tenderness in right side of abdomen. Cause of abdominal pain is uncertain, recommend abdominal  CT scan to evaluate for other abnormalities and exclude malignancy. Also have the option of elective gallbladder removal as cholelithiasis could be partially contributing to her symptoms. Discussed with pt, she is agreeable to both.     Plan    Schedule CT scan of abdomen-if no other findings will discuss elective cholecystectomy     HPI, Physical Exam, Assessment and Plan have been scribed under the direction and in the  presence of Mckinley Jewel, MD.  Verlene Mayer, CMA  I have completed the exam and reviewed the above documentation for accuracy and completeness.  I agree with the above.  Haematologist has been used and any errors in dictation or transcription are unintentional.  Linde Wilensky G. Jamal Collin, M.D., F.A.C.S.  Junie Panning G 11/06/2016, 11:59 AM  Patient has been scheduled for a CT abdomen with contrast at [place] for 11-12-16 at 8 am (arrive 7:45 am). Prep: NPO after midnight and pick up prep kit. Patient verbalizes understanding.  Dominga Ferry, CMA

## 2016-11-12 ENCOUNTER — Telehealth: Payer: Self-pay

## 2016-11-12 ENCOUNTER — Ambulatory Visit
Admission: RE | Admit: 2016-11-12 | Discharge: 2016-11-12 | Disposition: A | Discharge status: Home / Self Care | Payer: PRIVATE HEALTH INSURANCE | Source: Ambulatory Visit | Attending: General Surgery | Admitting: General Surgery

## 2016-11-12 DIAGNOSIS — K429 Umbilical hernia without obstruction or gangrene: Secondary | ICD-10-CM | POA: Diagnosis not present

## 2016-11-12 DIAGNOSIS — K8080 Other cholelithiasis without obstruction: Secondary | ICD-10-CM | POA: Insufficient documentation

## 2016-11-12 DIAGNOSIS — R1011 Right upper quadrant pain: Secondary | ICD-10-CM

## 2016-11-12 MED ORDER — IOPAMIDOL (ISOVUE-300) INJECTION 61%
100.0000 mL | Freq: Once | INTRAVENOUS | Status: AC | PRN
  Administered 2016-11-12: 100 mL via INTRAVENOUS

## 2016-11-12 NOTE — Telephone Encounter (Signed)
-----   Message from Christene Lye, MD sent at 11/12/2016 10:21 AM EDT ----- Need to see pt to discuss cholecystectomy

## 2016-11-14 ENCOUNTER — Ambulatory Visit (INDEPENDENT_AMBULATORY_CARE_PROVIDER_SITE_OTHER): Payer: PRIVATE HEALTH INSURANCE | Admitting: General Surgery

## 2016-11-14 ENCOUNTER — Encounter: Payer: Self-pay | Admitting: General Surgery

## 2016-11-14 VITALS — BP 130/74 | HR 76 | Resp 12 | Ht 62.0 in | Wt 223.0 lb

## 2016-11-14 DIAGNOSIS — K8010 Calculus of gallbladder with chronic cholecystitis without obstruction: Secondary | ICD-10-CM | POA: Diagnosis not present

## 2016-11-14 DIAGNOSIS — R1011 Right upper quadrant pain: Secondary | ICD-10-CM | POA: Diagnosis not present

## 2016-11-14 NOTE — Patient Instructions (Signed)

## 2016-11-14 NOTE — Progress Notes (Signed)
Patient ID: Sydney Valenzuela, female   DOB: 07/11/1980, 36 y.o.   MRN: [id number]  Chief Complaint  Patient presents with  . Follow-up    gallstone    HPI Sydney Valenzuela is a 36 y.o. female here today to discuss ct scan don eon 11/12/2016. Patient states no change since last visit.  HPI  Past Medical History:  Diagnosis Date  . Hiatal hernia   . Hypertension     Past Surgical History:  Procedure Laterality Date  . ESOPHAGOGASTRODUODENOSCOPY (EGD) WITH PROPOFOL N/A 11/04/2016   Procedure: ESOPHAGOGASTRODUODENOSCOPY (EGD) WITH PROPOFOL;  Surgeon: Elliott, Robert T, MD;  Location: [facility] ENDOSCOPY;  Service: Endoscopy;  Laterality: N/A;  . NASAL SINUS SURGERY    . TONSILLECTOMY      Family History  Problem Relation Age of Onset  . Hypertension Mother     Social History Social History  Substance Use Topics  . Smoking status: Current Every Day Smoker  . Smokeless tobacco: Never Used  . Alcohol use Yes     Comment: occ    Allergies  Allergen Reactions  . Codeine Itching    Current Outpatient Prescriptions  Medication Sig Dispense Refill  . acetaminophen (TYLENOL) 325 MG tablet Take 650 mg by mouth every 4 (four) hours as needed.    . NON FORMULARY 250 mg.    . pantoprazole (PROTONIX) 40 MG tablet Take 40 mg by mouth 2 (two) times daily.    . sucralfate (CARAFATE) 1 g tablet Take 1 g by mouth 2 (two) times daily.     No current facility-administered medications for this visit.     Review of Systems Review of Systems  Constitutional: Negative.   Respiratory: Negative.   Cardiovascular: Negative.     Blood pressure 130/74, pulse 76, resp. rate 12, height 5' 2" (1.575 m), weight 223 lb (101.2 kg).  Physical Exam Physical Exam  Constitutional: She is oriented to person, place, and time. She appears well-developed and well-nourished.  Neurological: She is alert and oriented to person, place, and time.  Skin: Skin is warm and dry.    Data Reviewed Ct scan reviewed  and last note  Assessment    No findings other than a tiny gallstone  Noted on CT. Reasonable to proceed with cholecystectomy. Pt advised- her rt sided abd pain may or may not resolve given the atypical nature of pain.Procedure risks and benefits explained. Pt is agreeable    Plan      Laparoscopic Cholecystectomy with Intraoperative Cholangiogram. The procedure, including it's potential risks and complications (including but not limited to infection, bleeding, injury to intra-abdominal organs or bile ducts, bile leak, poor cosmetic result, sepsis and death) were discussed with the patient in detail. Non-operative options, including their inherent risks (acute calculous cholecystitis with possible choledocholithiasis or gallstone pancreatitis, with the risk of ascending cholangitis, sepsis, and death) were discussed as well. The patient expressed and understanding of what we discussed and wishes to proceed with laparoscopic cholecystectomy. The patient further understands that if it is technically not possible, or it is unsafe to proceed laparoscopically, that I will convert to an open cholecystectomy.  HPI, Physical Exam, Assessment and Plan have been scribed under the direction and in the presence of S. G. Babetta Paterson, MD  Sydney Valenzuela, CMA I have completed the exam and reviewed the above documentation for accuracy and completeness.  I agree with the above.  [employer] has been used and any errors in dictation or transcription are unintentional.  Maryon Kemnitz   G. Yogesh Cominsky, M.D., F.A.C.S.   Meira Wahba G 11/14/2016, 4:58 PM  Patient's surgery has been scheduled for 11-21-16 at [facility].   Michele J. Bailey, CMA   

## 2016-11-18 ENCOUNTER — Inpatient Hospital Stay: Admission: RE | Admit: 2016-11-18 | Payer: PRIVATE HEALTH INSURANCE | Source: Ambulatory Visit

## 2016-11-19 ENCOUNTER — Encounter
Admission: RE | Admit: 2016-11-19 | Discharge: 2016-11-19 | Disposition: A | Discharge status: Home / Self Care | Payer: PRIVATE HEALTH INSURANCE | Source: Ambulatory Visit | Attending: General Surgery | Admitting: General Surgery

## 2016-11-19 HISTORY — DX: Family history of other specified conditions: Z84.89

## 2016-11-19 NOTE — Patient Instructions (Signed)
Your procedure is scheduled on: 11/21/16 Report to [place]. 2ND FLOOR MEDICAL MALL ENTRANCE. To find out your arrival time please call [phone] between 1PM - 3PM on 11/20/16.  Remember: Instructions that are not followed completely may result in serious medical risk, up to and including death, or upon the discretion of your surgeon and anesthesiologist your surgery may need to be rescheduled.    __X__ 1. Do not eat food or drink liquids after midnight. No gum chewing or hard candies.     __X__ 2. No Alcohol for 24 hours before or after surgery.   ____ 3. Bring all medications with you on the day of surgery if instructed.    __X__ 4. Notify your doctor if there is any change in your medical condition     (cold, fever, infections).             __x___5. No smoking within 24 hours of your surgery.     Do not wear jewelry, make-up, hairpins, clips or nail polish.  Do not wear lotions, powders, or perfumes.   Do not shave 48 hours prior to surgery. Men may shave face and neck.  Do not bring valuables to the hospital.    [facility] is not responsible for any belongings or valuables             Contacts, dentures or bridgework may not be worn into surgery.  Leave your suitcase in the car. After surgery it may be brought to your room.  For patients admitted to the hospital, discharge time is determined by your                treatment team.   Patients discharged the day of surgery will not be allowed to drive home.   Please read over the following fact sheets that you were given:     __X__ Take these medicines the morning of surgery with A SIP OF WATER:    1. PROTONIX  2.   3.   4.  5.  6.  ____ Fleet Enema (as directed)   ____ Use CHG Soap as directed   ____ Use inhalers on the day of surgery  ____ Stop metformin 2 days prior to surgery    ____ Take 1/2 of usual insulin dose the night before surgery and none on the morning of surgery.   ____ Stop  Coumadin/Plavix/aspirin on   __X__ Stop Anti-inflammatories such as Advil, Aleve, Ibuprofen, Motrin, Naproxen, Naprosyn, Goodies,powder, or aspirin products.  OK to take Tylenol.   ____ Stop supplements until after surgery.    ____ Bring C-Pap to the hospital.

## 2016-11-20 MED ORDER — CEFAZOLIN SODIUM-DEXTROSE 2-4 GM/100ML-% IV SOLN
2.0000 g | INTRAVENOUS | Status: AC
  Administered 2016-11-21: 2 g via INTRAVENOUS

## 2016-11-21 ENCOUNTER — Ambulatory Visit: Payer: PRIVATE HEALTH INSURANCE | Admitting: Anesthesiology

## 2016-11-21 ENCOUNTER — Ambulatory Visit
Admission: RE | Admit: 2016-11-21 | Discharge: 2016-11-21 | Disposition: A | Discharge status: Home / Self Care | Payer: PRIVATE HEALTH INSURANCE | Source: Ambulatory Visit | Attending: General Surgery | Admitting: General Surgery

## 2016-11-21 ENCOUNTER — Encounter
Admission: RE | Disposition: A | Discharge status: Home / Self Care | Payer: Self-pay | Source: Ambulatory Visit | Attending: General Surgery

## 2016-11-21 ENCOUNTER — Ambulatory Visit: Payer: PRIVATE HEALTH INSURANCE

## 2016-11-21 DIAGNOSIS — F17200 Nicotine dependence, unspecified, uncomplicated: Secondary | ICD-10-CM | POA: Diagnosis not present

## 2016-11-21 DIAGNOSIS — K8010 Calculus of gallbladder with chronic cholecystitis without obstruction: Secondary | ICD-10-CM | POA: Diagnosis not present

## 2016-11-21 DIAGNOSIS — Z79899 Other long term (current) drug therapy: Secondary | ICD-10-CM | POA: Insufficient documentation

## 2016-11-21 DIAGNOSIS — K8020 Calculus of gallbladder without cholecystitis without obstruction: Secondary | ICD-10-CM

## 2016-11-21 DIAGNOSIS — I10 Essential (primary) hypertension: Secondary | ICD-10-CM | POA: Insufficient documentation

## 2016-11-21 HISTORY — PX: CHOLECYSTECTOMY: SHX55

## 2016-11-21 LAB — POCT PREGNANCY, URINE: Preg Test, Ur: NEGATIVE

## 2016-11-21 SURGERY — LAPAROSCOPIC CHOLECYSTECTOMY WITH INTRAOPERATIVE CHOLANGIOGRAM
Anesthesia: General | Wound class: Clean Contaminated

## 2016-11-21 MED ORDER — DEXAMETHASONE SODIUM PHOSPHATE 10 MG/ML IJ SOLN
INTRAMUSCULAR | Status: DC | PRN
  Administered 2016-11-21: 10 mg via INTRAVENOUS

## 2016-11-21 MED ORDER — SUCCINYLCHOLINE CHLORIDE 20 MG/ML IJ SOLN
INTRAMUSCULAR | Status: DC | PRN
  Administered 2016-11-21: 120 mg via INTRAVENOUS

## 2016-11-21 MED ORDER — PROPOFOL 10 MG/ML IV BOLUS
INTRAVENOUS | Status: DC | PRN
  Administered 2016-11-21: 100 mg via INTRAVENOUS
  Administered 2016-11-21: 200 mg via INTRAVENOUS

## 2016-11-21 MED ORDER — FENTANYL CITRATE (PF) 100 MCG/2ML IJ SOLN
INTRAMUSCULAR | Status: AC
  Filled 2016-11-21: qty 2

## 2016-11-21 MED ORDER — LIDOCAINE HCL (PF) 2 % IJ SOLN
INTRAMUSCULAR | Status: AC
  Filled 2016-11-21: qty 2

## 2016-11-21 MED ORDER — FENTANYL CITRATE (PF) 100 MCG/2ML IJ SOLN
25.0000 ug | INTRAMUSCULAR | Status: AC | PRN
  Administered 2016-11-21 (×3): 25 ug via INTRAVENOUS
  Administered 2016-11-21: 50 ug via INTRAVENOUS
  Administered 2016-11-21 (×2): 25 ug via INTRAVENOUS

## 2016-11-21 MED ORDER — ACETAMINOPHEN 10 MG/ML IV SOLN
INTRAVENOUS | Status: AC
  Filled 2016-11-21: qty 100

## 2016-11-21 MED ORDER — SUGAMMADEX SODIUM 200 MG/2ML IV SOLN
INTRAVENOUS | Status: AC
  Filled 2016-11-21: qty 2

## 2016-11-21 MED ORDER — ROCURONIUM BROMIDE 100 MG/10ML IV SOLN
INTRAVENOUS | Status: DC | PRN
  Administered 2016-11-21: 50 mg via INTRAVENOUS
  Administered 2016-11-21: 20 mg via INTRAVENOUS

## 2016-11-21 MED ORDER — KETOROLAC TROMETHAMINE 30 MG/ML IJ SOLN
INTRAMUSCULAR | Status: AC
  Filled 2016-11-21: qty 1

## 2016-11-21 MED ORDER — KETOROLAC TROMETHAMINE 30 MG/ML IJ SOLN
INTRAMUSCULAR | Status: DC | PRN
  Administered 2016-11-21: 30 mg via INTRAVENOUS

## 2016-11-21 MED ORDER — OXYCODONE HCL 5 MG PO TABS
5.0000 mg | ORAL_TABLET | Freq: Once | ORAL | Status: AC | PRN
  Administered 2016-11-21: 5 mg via ORAL

## 2016-11-21 MED ORDER — FENTANYL CITRATE (PF) 100 MCG/2ML IJ SOLN
INTRAMUSCULAR | Status: AC
  Administered 2016-11-21: 25 ug via INTRAVENOUS
  Filled 2016-11-21: qty 2

## 2016-11-21 MED ORDER — MIDAZOLAM HCL 2 MG/2ML IJ SOLN
INTRAMUSCULAR | Status: DC | PRN
  Administered 2016-11-21 (×2): 2 mg via INTRAVENOUS

## 2016-11-21 MED ORDER — OXYCODONE-ACETAMINOPHEN 5-325 MG PO TABS: 1.0000 | ORAL_TABLET | ORAL | 0 refills | Status: DC | PRN

## 2016-11-21 MED ORDER — SODIUM CHLORIDE 0.9 % IJ SOLN
INTRAMUSCULAR | Status: AC
  Filled 2016-11-21: qty 50

## 2016-11-21 MED ORDER — PROPOFOL 10 MG/ML IV BOLUS
INTRAVENOUS | Status: AC
  Filled 2016-11-21: qty 40

## 2016-11-21 MED ORDER — ROCURONIUM BROMIDE 50 MG/5ML IV SOLN
INTRAVENOUS | Status: AC
  Filled 2016-11-21: qty 1

## 2016-11-21 MED ORDER — SUCCINYLCHOLINE CHLORIDE 20 MG/ML IJ SOLN
INTRAMUSCULAR | Status: AC
  Filled 2016-11-21: qty 1

## 2016-11-21 MED ORDER — LACTATED RINGERS IV SOLN
INTRAVENOUS | Status: DC
  Administered 2016-11-21 (×3): via INTRAVENOUS

## 2016-11-21 MED ORDER — ONDANSETRON HCL 4 MG/2ML IJ SOLN
INTRAMUSCULAR | Status: AC
  Filled 2016-11-21: qty 4

## 2016-11-21 MED ORDER — ONDANSETRON HCL 4 MG/2ML IJ SOLN
INTRAMUSCULAR | Status: DC | PRN
  Administered 2016-11-21: 8 mg via INTRAVENOUS

## 2016-11-21 MED ORDER — DEXAMETHASONE SODIUM PHOSPHATE 10 MG/ML IJ SOLN
INTRAMUSCULAR | Status: AC
  Filled 2016-11-21: qty 1

## 2016-11-21 MED ORDER — PHENYLEPHRINE HCL 10 MG/ML IJ SOLN
INTRAMUSCULAR | Status: AC
  Filled 2016-11-21: qty 1

## 2016-11-21 MED ORDER — SEVOFLURANE IN SOLN
RESPIRATORY_TRACT | Status: AC
  Filled 2016-11-21: qty 250

## 2016-11-21 MED ORDER — FENTANYL CITRATE (PF) 100 MCG/2ML IJ SOLN
INTRAMUSCULAR | Status: DC | PRN
Start: 2016-11-21 — End: 2016-11-21
  Administered 2016-11-21 (×3): 50 ug via INTRAVENOUS
  Administered 2016-11-21: 100 ug via INTRAVENOUS
  Administered 2016-11-21: 50 ug via INTRAVENOUS

## 2016-11-21 MED ORDER — SODIUM CHLORIDE 0.9 % IV SOLN
INTRAVENOUS | Status: DC | PRN
  Administered 2016-11-21: 10 mL

## 2016-11-21 MED ORDER — FENTANYL CITRATE (PF) 100 MCG/2ML IJ SOLN
25.0000 ug | INTRAMUSCULAR | Status: AC
  Administered 2016-11-21: 25 ug via INTRAVENOUS

## 2016-11-21 MED ORDER — MIDAZOLAM HCL 2 MG/2ML IJ SOLN
INTRAMUSCULAR | Status: AC
  Filled 2016-11-21: qty 2

## 2016-11-21 MED ORDER — LIDOCAINE HCL (CARDIAC) 20 MG/ML IV SOLN
INTRAVENOUS | Status: DC | PRN
  Administered 2016-11-21: 100 mg via INTRAVENOUS

## 2016-11-21 MED ORDER — OXYCODONE HCL 5 MG/5ML PO SOLN: 5.0000 mg | Freq: Once | ORAL | Status: AC | PRN

## 2016-11-21 MED ORDER — ACETAMINOPHEN 10 MG/ML IV SOLN
INTRAVENOUS | Status: DC | PRN
  Administered 2016-11-21: 1000 mg via INTRAVENOUS

## 2016-11-21 MED ORDER — OXYCODONE HCL 5 MG PO TABS
ORAL_TABLET | ORAL | Status: AC
  Administered 2016-11-21: 5 mg via ORAL
  Filled 2016-11-21: qty 1

## 2016-11-21 MED ORDER — SUGAMMADEX SODIUM 200 MG/2ML IV SOLN
INTRAVENOUS | Status: DC | PRN
  Administered 2016-11-21: 200 mg via INTRAVENOUS

## 2016-11-21 MED ORDER — CHLORHEXIDINE GLUCONATE CLOTH 2 % EX PADS: 6.0000 | MEDICATED_PAD | Freq: Once | CUTANEOUS | Status: DC

## 2016-11-21 MED ORDER — ROCURONIUM BROMIDE 50 MG/5ML IV SOLN
INTRAVENOUS | Status: AC
Start: 2016-11-21 — End: 2016-11-21
  Filled 2016-11-21: qty 1

## 2016-11-21 SURGICAL SUPPLY — 37 items
ANCHOR TIS RET SYS 235ML (MISCELLANEOUS) ×2 IMPLANT
APPLICATOR SURGIFLO ENDO (HEMOSTASIS) IMPLANT
APPLIER CLIP LOGIC TI 5 (MISCELLANEOUS) ×2 IMPLANT
BLADE SURG 11 STRL SS SAFETY (MISCELLANEOUS) ×2 IMPLANT
CANISTER SUCT 1200ML W/VALVE (MISCELLANEOUS) ×2 IMPLANT
CANNULA DILATOR 10 W/SLV (CANNULA) ×2 IMPLANT
CATH CHOLANG 76X19 KUMAR (CATHETERS) ×2 IMPLANT
CHLORAPREP W/TINT 26ML (MISCELLANEOUS) ×2 IMPLANT
DEFOGGER SCOPE WARMER CLEARIFY (MISCELLANEOUS) ×2 IMPLANT
DRAPE C-ARM XRAY 36X54 (DRAPES) ×2 IMPLANT
DRAPE INCISE IOBAN 66X45 STRL (DRAPES) ×2 IMPLANT
DRSG TEGADERM 2-3/8X2-3/4 SM (GAUZE/BANDAGES/DRESSINGS) ×8 IMPLANT
DRSG TELFA 4X3 1S NADH ST (GAUZE/BANDAGES/DRESSINGS) ×2 IMPLANT
ELECT REM PT RETURN 9FT ADLT (ELECTROSURGICAL) ×2
ELECTRODE REM PT RTRN 9FT ADLT (ELECTROSURGICAL) ×1 IMPLANT
GLOVE BIO SURGEON STRL SZ7 (GLOVE) ×6 IMPLANT
GOWN STRL REUS W/ TWL LRG LVL3 (GOWN DISPOSABLE) ×3 IMPLANT
GOWN STRL REUS W/TWL LRG LVL3 (GOWN DISPOSABLE) ×3
GRASPER SUT TROCAR 14GX15 (MISCELLANEOUS) ×2 IMPLANT
HEMOSTAT SURGICEL 2X3 (HEMOSTASIS) IMPLANT
IRRIGATION STRYKERFLOW (MISCELLANEOUS) ×1 IMPLANT
IRRIGATOR STRYKERFLOW (MISCELLANEOUS) ×2
IV LACTATED RINGERS 1000ML (IV SOLUTION) ×2 IMPLANT
KIT RM TURNOVER STRD PROC AR (KITS) ×2 IMPLANT
LABEL OR SOLS (LABEL) ×2 IMPLANT
NDL INSUFF ACCESS 14 VERSASTEP (NEEDLE) ×2 IMPLANT
PACK LAP CHOLECYSTECTOMY (MISCELLANEOUS) ×2 IMPLANT
SCISSORS METZENBAUM CVD 33 (INSTRUMENTS) ×2 IMPLANT
SLEEVE ENDOPATH XCEL 5M (ENDOMECHANICALS) ×4 IMPLANT
SPOGE SURGIFLO 8M (HEMOSTASIS)
SPONGE SURGIFLO 8M (HEMOSTASIS) IMPLANT
STRIP CLOSURE SKIN 1/2X4 (GAUZE/BANDAGES/DRESSINGS) ×2 IMPLANT
SUT VIC AB 0 SH 27 (SUTURE) ×2 IMPLANT
SUT VIC AB 4-0 FS2 27 (SUTURE) ×2 IMPLANT
SWABSTK COMLB BENZOIN TINCTURE (MISCELLANEOUS) ×2 IMPLANT
TROCAR XCEL NON-BLD 5MMX100MML (ENDOMECHANICALS) ×2 IMPLANT
TUBING INSUFFLATOR HI FLOW (MISCELLANEOUS) ×2 IMPLANT

## 2016-11-21 NOTE — Transfer of Care (Signed)
Immediate Anesthesia Transfer of Care Note  Patient: Sydney Valenzuela  Procedure(s) Performed: Procedure(s): LAPAROSCOPIC CHOLECYSTECTOMY WITH INTRAOPERATIVE CHOLANGIOGRAM (N/A)  Patient Location: PACU  Anesthesia Type:General  Level of Consciousness: awake, oriented and patient cooperative  Airway & Oxygen Therapy: Patient Spontanous Breathing and Patient connected to face mask oxygen  Post-op Assessment: Report given to RN, Post -op Vital signs reviewed and stable and Patient moving all extremities X 4  Post vital signs: Reviewed and stable  Last Vitals:  Vitals:   11/21/16 0614 11/21/16 0912  BP: (!) 147/90 (P) 129/81  Pulse: 79   Resp:  (P) 16  Temp: 36.6 C (P) 36.9 C  SpO2: 99%     Last Pain:  Vitals:   11/21/16 0614  TempSrc: Tympanic         Complications: No apparent anesthesia complications

## 2016-11-21 NOTE — Discharge Instructions (Signed)

## 2016-11-21 NOTE — Anesthesia Post-op Follow-up Note (Signed)
Anesthesia QCDR form completed.        

## 2016-11-21 NOTE — Anesthesia Preprocedure Evaluation (Signed)
Anesthesia Evaluation  Patient identified by MRN, date of birth, ID band Patient awake    Reviewed: Allergy & Precautions, H&P , NPO status , Patient's Chart, lab work & pertinent test results  History of Anesthesia Complications (+) PROLONGED EMERGENCE, Family history of anesthesia reaction and history of anesthetic complications  Airway Mallampati: III  TM Distance: >3 FB Neck ROM: full    Dental  (+) Poor Dentition, Chipped   Pulmonary neg shortness of breath, Current Smoker,           Cardiovascular Exercise Tolerance: Good hypertension, (-) angina(-) Past MI and (-) DOE      Neuro/Psych  Neuromuscular disease negative psych ROS   GI/Hepatic Neg liver ROS, hiatal hernia, GERD  Medicated and Controlled,  Endo/Other  negative endocrine ROS  Renal/GU      Musculoskeletal   Abdominal   Peds  Hematology negative hematology ROS (+)   Anesthesia Other Findings Past Medical History: No date: Family history of adverse reaction to anesthesia     Comment:  N/V, difficult to wake up No date: Hiatal hernia No date: Hypertension  Past Surgical History: 11/04/2016: ESOPHAGOGASTRODUODENOSCOPY (EGD) WITH PROPOFOL; N/A     Comment:  Procedure: ESOPHAGOGASTRODUODENOSCOPY (EGD) WITH               PROPOFOL;  Surgeon: Manya Silvas, MD;  Location:               [facility] ENDOSCOPY;  Service: Endoscopy;  Laterality: N/A; No date: LEEP No date: NASAL SINUS SURGERY No date: TONSILLECTOMY  BMI    Body Mass Index:  40.79 kg/m      Reproductive/Obstetrics negative OB ROS                             Anesthesia Physical Anesthesia Plan  ASA: III  Anesthesia Plan: General ETT   Post-op Pain Management:    Induction: Intravenous  PONV Risk Score and Plan:   Airway Management Planned: Oral ETT  Additional Equipment:   Intra-op Plan:   Post-operative Plan: Extubation in OR  Informed Consent:  I have reviewed the patients History and Physical, chart, labs and discussed the procedure including the risks, benefits and alternatives for the proposed anesthesia with the patient or authorized representative who has indicated his/her understanding and acceptance.   Dental Advisory Given  Plan Discussed with: Anesthesiologist, CRNA and Surgeon  Anesthesia Plan Comments: (Patient consented for risks of anesthesia including but not limited to:  - adverse reactions to medications - damage to teeth, lips or other oral mucosa - sore throat or hoarseness - Damage to heart, brain, lungs or loss of life  Patient voiced understanding.)        Anesthesia Quick Evaluation

## 2016-11-21 NOTE — Anesthesia Postprocedure Evaluation (Signed)
Anesthesia Post Note  Patient: Sydney Valenzuela  Procedure(s) Performed: Procedure(s) (LRB): LAPAROSCOPIC CHOLECYSTECTOMY WITH INTRAOPERATIVE CHOLANGIOGRAM (N/A)  Patient location during evaluation: PACU Anesthesia Type: General Level of consciousness: awake and alert Pain management: pain level controlled Vital Signs Assessment: post-procedure vital signs reviewed and stable Respiratory status: spontaneous breathing, nonlabored ventilation, respiratory function stable and patient connected to nasal cannula oxygen Cardiovascular status: blood pressure returned to baseline and stable Postop Assessment: no signs of nausea or vomiting Anesthetic complications: no     Last Vitals:  Vitals:   11/21/16 1038 11/21/16 1052  BP:    Pulse: 81 71  Resp: (!) 27 18  Temp: 36.5 C 36.7 C  SpO2: 92% 94%    Last Pain:  Vitals:   11/21/16 1052  TempSrc: Oral  PainSc: 7                  Precious Haws Piscitello

## 2016-11-21 NOTE — H&P (View-Only) (Signed)
Patient ID: Sydney Valenzuela, female   DOB: 13-Nov-1980, 36 y.o.   MRN: [id number]  Chief Complaint  Patient presents with  . Follow-up    gallstone    HPI Sydney Valenzuela is a 36 y.o. female here today to discuss ct scan don eon 11/12/2016. Patient states no change since last visit.  HPI  Past Medical History:  Diagnosis Date  . Hiatal hernia   . Hypertension     Past Surgical History:  Procedure Laterality Date  . ESOPHAGOGASTRODUODENOSCOPY (EGD) WITH PROPOFOL N/A 11/04/2016   Procedure: ESOPHAGOGASTRODUODENOSCOPY (EGD) WITH PROPOFOL;  Surgeon: Manya Silvas, MD;  Location: [facility] ENDOSCOPY;  Service: Endoscopy;  Laterality: N/A;  . NASAL SINUS SURGERY    . TONSILLECTOMY      Family History  Problem Relation Age of Onset  . Hypertension Mother     Social History Social History  Substance Use Topics  . Smoking status: Current Every Day Smoker  . Smokeless tobacco: Never Used  . Alcohol use Yes     Comment: occ    Allergies  Allergen Reactions  . Codeine Itching    Current Outpatient Prescriptions  Medication Sig Dispense Refill  . acetaminophen (TYLENOL) 325 MG tablet Take 650 mg by mouth every 4 (four) hours as needed.    . NON FORMULARY 250 mg.    . pantoprazole (PROTONIX) 40 MG tablet Take 40 mg by mouth 2 (two) times daily.    . sucralfate (CARAFATE) 1 g tablet Take 1 g by mouth 2 (two) times daily.     No current facility-administered medications for this visit.     Review of Systems Review of Systems  Constitutional: Negative.   Respiratory: Negative.   Cardiovascular: Negative.     Blood pressure 130/74, pulse 76, resp. rate 12, height 5\' 2"  (1.575 m), weight 223 lb (101.2 kg).  Physical Exam Physical Exam  Constitutional: Sydney Valenzuela is oriented to person, place, and time. Sydney Valenzuela appears well-developed and well-nourished.  Neurological: Sydney Valenzuela is alert and oriented to person, place, and time.  Skin: Skin is warm and dry.    Data Reviewed Ct scan reviewed  and last note  Assessment    No findings other than a tiny gallstone  Noted on CT. Reasonable to proceed with cholecystectomy. Pt advised- her rt sided abd pain may or may not resolve given the atypical nature of pain.Procedure risks and benefits explained. Pt is agreeable    Plan      Laparoscopic Cholecystectomy with Intraoperative Cholangiogram. The procedure, including it's potential risks and complications (including but not limited to infection, bleeding, injury to intra-abdominal organs or bile ducts, bile leak, poor cosmetic result, sepsis and death) were discussed with the patient in detail. Non-operative options, including their inherent risks (acute calculous cholecystitis with possible choledocholithiasis or gallstone pancreatitis, with the risk of ascending cholangitis, sepsis, and death) were discussed as well. The patient expressed and understanding of what we discussed and wishes to proceed with laparoscopic cholecystectomy. The patient further understands that if it is technically not possible, or it is unsafe to proceed laparoscopically, that I will convert to an open cholecystectomy.  HPI, Physical Exam, Assessment and Plan have been scribed under the direction and in the presence of Mckinley Jewel, MD  Gaspar Cola, CMA I have completed the exam and reviewed the above documentation for accuracy and completeness.  I agree with the above.  Haematologist has been used and any errors in dictation or transcription are unintentional.  Kortney Schoenfelder  Robinette Haines, M.D., F.A.C.S.   Junie Panning G 11/14/2016, 4:58 PM  Patient's surgery has been scheduled for 11-21-16 at [facility].   Dominga Ferry, CMA

## 2016-11-21 NOTE — H&P (View-Only) (Signed)
Patient ID: Sydney Valenzuela, female   DOB: 08-14-1980, 36 y.o.   MRN: [id number]  Chief Complaint  Patient presents with  . Abdominal Pain    HPI Sydney Valenzuela is a 36 y.o. female is here today for an evaluation of abdominal pain. Patient states the pain started about one year ago, has gotten worse in the past 3-4 weeks.  Patient had an abdominal ultrasound done on 10/16/16. She states the pain is located in her upper right abdominal quadrant and extends to her back. Positive for nausea. She does have diarrhea-sometimes 20 times a day-denies blood in her stool positive for mucous. Had an upper endoscopy done on 11/04/16-she states showed inflammation and checking for H. Pylori testing was negative. She states she does have hiatal hernia found on endoscopy. HPI  Past Medical History:  Diagnosis Date  . Hiatal hernia   . Hypertension     Past Surgical History:  Procedure Laterality Date  . ESOPHAGOGASTRODUODENOSCOPY (EGD) WITH PROPOFOL N/A 11/04/2016   Procedure: ESOPHAGOGASTRODUODENOSCOPY (EGD) WITH PROPOFOL;  Surgeon: Manya Silvas, MD;  Location: [facility] ENDOSCOPY;  Service: Endoscopy;  Laterality: N/A;  . NASAL SINUS SURGERY    . TONSILLECTOMY      Family History  Problem Relation Age of Onset  . Hypertension Mother     Social History Social History  Substance Use Topics  . Smoking status: Current Every Day Smoker  . Smokeless tobacco: Never Used  . Alcohol use Yes     Comment: occ    Allergies  Allergen Reactions  . Codeine Itching    Current Outpatient Prescriptions  Medication Sig Dispense Refill  . acetaminophen (TYLENOL) 325 MG tablet Take 650 mg by mouth every 4 (four) hours as needed.    . NON FORMULARY 250 mg.    . pantoprazole (PROTONIX) 40 MG tablet Take 40 mg by mouth 2 (two) times daily.    . sucralfate (CARAFATE) 1 g tablet Take 1 g by mouth 2 (two) times daily.     No current facility-administered medications for this visit.     Review of  Systems Review of Systems  Constitutional: Negative.   Respiratory: Negative.   Cardiovascular: Negative.   Gastrointestinal: Positive for abdominal distention, abdominal pain, diarrhea, nausea and vomiting.    Blood pressure 128/73, resp. rate 12, height _0  (1.575 m), weight 225 lb (102.1 kg).  Physical Exam Physical Exam  Constitutional: She is oriented to person, place, and time. She appears well-developed and well-nourished.  Eyes: Conjunctivae are normal. No scleral icterus.  Neck: Neck supple.  Cardiovascular: Normal rate, regular rhythm and normal heart sounds.   Pulmonary/Chest: Effort normal and breath sounds normal.  Abdominal: Soft. Bowel sounds are normal. There is tenderness in the right upper quadrant. There is negative Murphy's sign.    Lymphadenopathy:    She has no cervical adenopathy.  Neurological: She is alert and oriented to person, place, and time.  Skin: Skin is warm and dry.    Data Reviewed Notes reviewed Recent labs- CBC, lipase, met B normal.  Endoscopy- cold biopsy of esophagus negative for dysplasia or malignancy Gallbladder US- contracted gallbladder with questionable 5 mm stone, no wall thickening   Assessment    Abdominal pain - chronic issue that seems to be getting worse. Labs, endoscopy, and imaging of gallbladder noted -contracted gallbladder with a questionable stone. The US findings are not consistent with the significant tenderness in right side of abdomen. Cause of abdominal pain is uncertain, recommend abdominal  CT scan to evaluate for other abnormalities and exclude malignancy. Also have the option of elective gallbladder removal as cholelithiasis could be partially contributing to her symptoms. Discussed with pt, she is agreeable to both.     Plan    Schedule CT scan of abdomen-if no other findings will discuss elective cholecystectomy     HPI, Physical Exam, Assessment and Plan have been scribed under the direction and in the  presence of Mckinley Jewel, MD.  Verlene Mayer, CMA  I have completed the exam and reviewed the above documentation for accuracy and completeness.  I agree with the above.  Haematologist has been used and any errors in dictation or transcription are unintentional.  Levent Kornegay G. Jamal Collin, M.D., F.A.C.S.  Junie Panning G 11/06/2016, 11:59 AM  Patient has been scheduled for a CT abdomen with contrast at [place] for 11-12-16 at 8 am (arrive 7:45 am). Prep: NPO after midnight and pick up prep kit. Patient verbalizes understanding.  Dominga Ferry, CMA

## 2016-11-21 NOTE — Interval H&P Note (Signed)
History and Physical Interval Note:  11/21/2016 7:03 AM  Sydney Valenzuela  has presented today for surgery, with the diagnosis of [place]  The various methods of treatment have been discussed with the patient and family. After consideration of risks, benefits and other options for treatment, the patient has consented to  Procedure(s): LAPAROSCOPIC CHOLECYSTECTOMY WITH INTRAOPERATIVE CHOLANGIOGRAM (N/A) as a surgical intervention .  The patient's history has been reviewed, patient examined, no change in status, stable for surgery.  I have reviewed the patient's chart and labs.  Questions were answered to the patient's satisfaction.     SANKAR,SEEPLAPUTHUR G

## 2016-11-21 NOTE — Op Note (Signed)
Preop diagnosis: Chronic cholecystitis and cholelithiasis  Post op diagnosis: Same  Operation: Laparoscopy cholecystectomy with intraoperative cholangiogram  Surgeon: Mckinley Jewel  Assistant:     Anesthesia: Gen.  Complications: None  EBL: Less than 20 mL  Drains: None  Description: Patient was put to sleep in supine position the operating table. Abdomen was prepped and draped as sterile field and timeout performed. 1 cm port incision was made at the inferior lip of the umbilicus and Veress needle position in the peritoneal cavity verified of the hanging drop method. Pneumoperitoneum was obtained followed by placement of a 10 mm port. Camera was introduced and epigastric and 2 lateral 5 mm ports were placed. The patient had mildly distended gallbladder with some thickened wall but no evidence of acute changes. Some adhesions of the surrounding fat to the gallbladder were easily taken down with cautery until the Hartman's pouch was identified. An angled scope was used to visualize this area better. With careful dissection the cystic duct was freed then Kumar clamp and catheter were positioned. Cholangiogram was performed showing that there was a filling defect consistent with a stone at the junction of the gallbladder and the cystic duct which delayed the flow into the bile duct area. On delayed images there was a very slow filling of the tortuous long cystic duct which ultimately filled part of the proximal common bile duct. The side of the common bile duct was normal. Symptoms appear this single stone was so large in the region of the cystic duct opening with a very slow trickle of bile through this further attempts were not made  for cholangiogram. The catheter was used to decompress the gallbladder and then removed. Cystic duct was hemoclipped and cut. The cystic artery along with the posterior branch of this were hemoclipped separately and then cut. Gallbladder was dissected free from its bed  using cautery for control of bleeding. Following this the area was irrigated with some saline and fluid was suctioned out both below and around the liver. The clips were inspected and were noted to be all intact. With a 5 mm scope in the epigastric port site the gallbladder was placed in a retrieval bag and brought out through the umbilical port site. Noted again that contained a tiny stone lodged near the junction of the gallbladder with the cystic duct. The fascial opening the umbilicus closed with 0 Vicryl placed with a suture passer. Pneumoperitoneum was then released the remaining ports removed. All skin incisions closed with subcuticular 4-0 Vicryl reinforced with Steri-Strips and tincture benzoin. Telfa and Tegaderm dressings were placed patient subsequently extubated and returned recovery room in stable condition

## 2016-11-21 NOTE — Anesthesia Procedure Notes (Signed)
Procedure Name: Intubation Date/Time: 11/21/2016 7:30 AM Performed by: Silvana Newness Pre-anesthesia Checklist: Patient identified, Emergency Drugs available, Suction available, Patient being monitored and Timeout performed Patient Re-evaluated:Patient Re-evaluated prior to induction Oxygen Delivery Method: Circle system utilized Preoxygenation: Pre-oxygenation with 100% oxygen Induction Type: IV induction, Rapid sequence and Cricoid Pressure applied Laryngoscope Size: Mac and 3 Grade View: Grade I Tube type: Oral Tube size: 7.0 mm Number of attempts: 1 Airway Equipment and Method: Stylet Placement Confirmation: ETT inserted through vocal cords under direct vision,  positive ETCO2 and breath sounds checked- equal and bilateral Secured at: 20 cm Tube secured with: Tape Dental Injury: Teeth and Oropharynx as per pre-operative assessment

## 2016-11-21 NOTE — Interval H&P Note (Signed)
History and Physical Interval Note:  11/21/2016 7:04 AM  Sydney Valenzuela  has presented today for surgery, with the diagnosis of [place]  The various methods of treatment have been discussed with the patient and family. After consideration of risks, benefits and other options for treatment, the patient has consented to  Procedure(s): LAPAROSCOPIC CHOLECYSTECTOMY WITH INTRAOPERATIVE CHOLANGIOGRAM (N/A) as a surgical intervention .  The patient's history has been reviewed, patient examined, no change in status, stable for surgery.  I have reviewed the patient's chart and labs.  Questions were answered to the patient's satisfaction.     [name]

## 2016-11-22 LAB — SURGICAL PATHOLOGY

## 2016-11-28 ENCOUNTER — Encounter: Payer: Self-pay | Admitting: General Surgery

## 2016-11-28 ENCOUNTER — Ambulatory Visit (INDEPENDENT_AMBULATORY_CARE_PROVIDER_SITE_OTHER): Payer: PRIVATE HEALTH INSURANCE | Admitting: General Surgery

## 2016-11-28 VITALS — BP 138/80 | HR 81 | Resp 14 | Ht 62.0 in | Wt 226.0 lb

## 2016-11-28 DIAGNOSIS — K8010 Calculus of gallbladder with chronic cholecystitis without obstruction: Secondary | ICD-10-CM

## 2016-11-28 NOTE — Patient Instructions (Addendum)
The patient is aware to call back for any questions or concerns. Resume activities as tolerated.

## 2016-11-28 NOTE — Progress Notes (Signed)
Patient ID: Sydney Valenzuela, female   DOB: 11/19/1980, 36 y.o.   MRN: [id number]  Chief Complaint  Patient presents with  . Routine Post Op    HPI Sydney Valenzuela is a 36 y.o. female.  Here today for postoperative visit, laparoscopic cholecystectomy 11-21-16, she states she is doing well. She is still having trouble swallowing and getting her pills down. Denies any gastrointestinal issues, bowels are moving regular.  HPI  Past Medical History:  Diagnosis Date  . Family history of adverse reaction to anesthesia    N/V, difficult to wake up  . Hiatal hernia   . Hypertension     Past Surgical History:  Procedure Laterality Date  . CHOLECYSTECTOMY N/A 11/21/2016   Procedure: LAPAROSCOPIC CHOLECYSTECTOMY WITH INTRAOPERATIVE CHOLANGIOGRAM;  Surgeon: Christene Lye, MD;  Location: [facility] ORS;  Service: General;  Laterality: N/A;  . ESOPHAGOGASTRODUODENOSCOPY (EGD) WITH PROPOFOL N/A 11/04/2016   Procedure: ESOPHAGOGASTRODUODENOSCOPY (EGD) WITH PROPOFOL;  Surgeon: Manya Silvas, MD;  Location: [facility] ENDOSCOPY;  Service: Endoscopy;  Laterality: N/A;  . LEEP    . NASAL SINUS SURGERY    . TONSILLECTOMY      Family History  Problem Relation Age of Onset  . Hypertension Mother     Social History Social History  Substance Use Topics  . Smoking status: Current Every Day Smoker    Packs/day: 0.50  . Smokeless tobacco: Never Used  . Alcohol use Yes     Comment: occ    Allergies  Allergen Reactions  . Codeine Itching    Initial dose is the worst, but becomes more tolerable with more doses    Current Outpatient Prescriptions  Medication Sig Dispense Refill  . acetaminophen (TYLENOL) 500 MG tablet Take 500-1,000 mg by mouth daily as needed for moderate pain or headache.    . albuterol (PROVENTIL HFA;VENTOLIN HFA) 108 (90 Base) MCG/ACT inhaler Inhale 1-2 puffs into the lungs every 6 (six) hours as needed for wheezing or shortness of breath.    . caffeine 200 MG TABS tablet Take  200-400 mg by mouth 2 (two) times daily as needed (energy). With natural supplements    . Calcium Carbonate-Vitamin D (CALCIUM 600+D PO) Take 1 tablet by mouth daily.    . fluticasone (FLONASE) 50 MCG/ACT nasal spray Place 1 spray into both nostrils daily as needed for rhinitis.    Marland Kitchen ibuprofen (ADVIL,MOTRIN) 200 MG tablet Take 200-600 mg by mouth daily as needed for headache or moderate pain.    . Melatonin-Pyridoxine (MELATONIN-B6 PO) Take 1 tablet by mouth daily.    . Multiple Vitamin (MULTIVITAMIN WITH MINERALS) TABS tablet Take 1 tablet by mouth daily.    . Multiple Vitamins-Minerals (HAIR SKIN AND NAILS FORMULA) TABS Take 1 tablet by mouth daily.    . mupirocin ointment (BACTROBAN) 2 % Place 1 application into the nose daily as needed (congestion). Mixed with saline and used as a spray    . pantoprazole (PROTONIX) 40 MG tablet Take 40 mg by mouth 2 (two) times daily.    . Probiotic CAPS Take 1 capsule by mouth daily.    . sucralfate (CARAFATE) 1 g tablet Take 1 g by mouth 2 (two) times daily.    . Triamcinolone Acetonide (NASACORT ALLERGY 24HR NA) Place 1-2 sprays into the nose daily as needed (congestion).     No current facility-administered medications for this visit.     Review of Systems Review of Systems  Constitutional: Negative.   Respiratory: Negative.   Cardiovascular: Negative.  Gastrointestinal: Negative for constipation and diarrhea.    Blood pressure 138/80, pulse 81, resp. rate 14, height 5\' 2"  (1.575 m), weight 226 lb (102.5 kg).  Physical Exam Physical Exam  Constitutional: She is oriented to person, place, and time. She appears well-developed and well-nourished.  HENT:  Mouth/Throat: Oropharynx is clear and moist.  Eyes: Conjunctivae are normal. No scleral icterus.  Neck: Neck supple.  Cardiovascular: Normal rate, regular rhythm and normal heart sounds.   Pulmonary/Chest: Effort normal and breath sounds normal.  Abdominal: Soft. Bowel sounds are normal.  There is no tenderness.  Lymphadenopathy:    She has no cervical adenopathy.  Neurological: She is alert and oriented to person, place, and time.  Skin: Skin is warm and dry.  Psychiatric: Her behavior is normal.  All port sites are healing well  Data Reviewed Progress notes, operative report  Assessment    Stable postoperative exam. The patient did have a stone lodged near the mouth of the cystic duct. Very likely this was the cause of her pain in the right side of the abdomen Swallowing difficulties which are being followed by Dr. Vira Agar.    Plan    Follow up with Dr Vira Agar regarding swallowing issues. Follow up as needed. Resume activities as tolerated.      HPI, Physical Exam, Assessment and Plan have been scribed under the direction and in the presence of Mckinley Jewel, MD Karie Fetch, RN I have completed the exam and reviewed the above documentation for accuracy and completeness.  I agree with the above.  Haematologist has been used and any errors in dictation or transcription are unintentional.  Shekera Beavers G. Jamal Collin, M.D., F.A.C.S.   Junie Panning G 11/28/2016, 11:23 AM

## 2016-12-09 ENCOUNTER — Ambulatory Visit (INDEPENDENT_AMBULATORY_CARE_PROVIDER_SITE_OTHER): Payer: PRIVATE HEALTH INSURANCE | Admitting: *Deleted

## 2016-12-09 DIAGNOSIS — K8010 Calculus of gallbladder with chronic cholecystitis without obstruction: Secondary | ICD-10-CM

## 2016-12-09 NOTE — Progress Notes (Signed)
Patient states she has been having some sharp pain in her right back and radiations to her gallbladder site. This has been going on for two days. Patient states the pain last for about one minute. Yesterday she states the pain is constant. No fever or chills. Surgery was on 11/21/2016.  Dr. Jamal Collin came in and saw patient Lungs are clear abdomen is soft. No jaundice noticed  Order CBC, Lipase and Hepatic function  Follow up appointment to be announced.

## 2016-12-09 NOTE — Patient Instructions (Signed)
Patient to return as needed. 

## 2016-12-10 ENCOUNTER — Telehealth: Payer: Self-pay | Admitting: *Deleted

## 2016-12-10 LAB — CBC WITH DIFFERENTIAL/PLATELET
BASOS ABS: 0.1 10*3/uL (ref 0.0–0.2)
Basos: 0 %
EOS (ABSOLUTE): 0.1 10*3/uL (ref 0.0–0.4)
Eos: 1 %
Hematocrit: 43.3 % (ref 34.0–46.6)
Hemoglobin: 14.5 g/dL (ref 11.1–15.9)
IMMATURE GRANS (ABS): 0 10*3/uL (ref 0.0–0.1)
Immature Granulocytes: 0 %
LYMPHS ABS: 2.1 10*3/uL (ref 0.7–3.1)
LYMPHS: 18 %
MCH: 29.5 pg (ref 26.6–33.0)
MCHC: 33.5 g/dL (ref 31.5–35.7)
MCV: 88 fL (ref 79–97)
Monocytes Absolute: 0.5 10*3/uL (ref 0.1–0.9)
Monocytes: 4 %
NEUTROS ABS: 8.4 10*3/uL — AB (ref 1.4–7.0)
Neutrophils: 77 %
PLATELETS: 253 10*3/uL (ref 150–379)
RBC: 4.92 x10E6/uL (ref 3.77–5.28)
RDW: 13.2 % (ref 12.3–15.4)
WBC: 11.2 10*3/uL — ABNORMAL HIGH (ref 3.4–10.8)

## 2016-12-10 LAB — LIPASE: Lipase: 34 U/L (ref 14–72)

## 2016-12-10 LAB — HEPATIC FUNCTION PANEL
ALT: 21 IU/L (ref 0–32)
AST: 14 IU/L (ref 0–40)
Albumin: 4.2 g/dL (ref 3.5–5.5)
Alkaline Phosphatase: 116 IU/L (ref 39–117)
Bilirubin Total: 0.4 mg/dL (ref 0.0–1.2)
Bilirubin, Direct: 0.13 mg/dL (ref 0.00–0.40)
TOTAL PROTEIN: 6.5 g/dL (ref 6.0–8.5)

## 2016-12-10 NOTE — Telephone Encounter (Signed)
Notified patient as instructed, patient pleased. Discussed follow-up appointments, patient agrees  

## 2016-12-10 NOTE — Telephone Encounter (Signed)
-----   Message from Christene Lye, MD sent at 12/10/2016  9:33 AM EDT ----- Please inform pt labs are normal.

## 2017-01-10 ENCOUNTER — Ambulatory Visit: Payer: Self-pay | Admitting: Unknown Physician Specialty

## 2017-01-28 ENCOUNTER — Ambulatory Visit (INDEPENDENT_AMBULATORY_CARE_PROVIDER_SITE_OTHER): Payer: PRIVATE HEALTH INSURANCE | Admitting: Unknown Physician Specialty

## 2017-01-28 ENCOUNTER — Telehealth: Payer: Self-pay | Admitting: Unknown Physician Specialty

## 2017-01-28 ENCOUNTER — Encounter: Payer: Self-pay | Admitting: Unknown Physician Specialty

## 2017-01-28 VITALS — BP 134/79 | HR 71 | Temp 99.1°F | Ht 63.7 in | Wt 222.2 lb

## 2017-01-28 DIAGNOSIS — F5101 Primary insomnia: Secondary | ICD-10-CM

## 2017-01-28 DIAGNOSIS — F419 Anxiety disorder, unspecified: Secondary | ICD-10-CM

## 2017-01-28 DIAGNOSIS — J309 Allergic rhinitis, unspecified: Secondary | ICD-10-CM | POA: Insufficient documentation

## 2017-01-28 DIAGNOSIS — M545 Low back pain: Secondary | ICD-10-CM

## 2017-01-28 DIAGNOSIS — M25519 Pain in unspecified shoulder: Secondary | ICD-10-CM | POA: Insufficient documentation

## 2017-01-28 DIAGNOSIS — M25561 Pain in right knee: Secondary | ICD-10-CM | POA: Insufficient documentation

## 2017-01-28 DIAGNOSIS — M542 Cervicalgia: Secondary | ICD-10-CM | POA: Insufficient documentation

## 2017-01-28 DIAGNOSIS — F322 Major depressive disorder, single episode, severe without psychotic features: Secondary | ICD-10-CM | POA: Diagnosis not present

## 2017-01-28 DIAGNOSIS — M25562 Pain in left knee: Secondary | ICD-10-CM | POA: Diagnosis not present

## 2017-01-28 DIAGNOSIS — M797 Fibromyalgia: Secondary | ICD-10-CM | POA: Diagnosis not present

## 2017-01-28 DIAGNOSIS — Z23 Encounter for immunization: Secondary | ICD-10-CM | POA: Diagnosis not present

## 2017-01-28 DIAGNOSIS — J301 Allergic rhinitis due to pollen: Secondary | ICD-10-CM | POA: Diagnosis not present

## 2017-01-28 DIAGNOSIS — F339 Major depressive disorder, recurrent, unspecified: Secondary | ICD-10-CM | POA: Insufficient documentation

## 2017-01-28 DIAGNOSIS — M549 Dorsalgia, unspecified: Secondary | ICD-10-CM | POA: Insufficient documentation

## 2017-01-28 DIAGNOSIS — G8929 Other chronic pain: Secondary | ICD-10-CM

## 2017-01-28 DIAGNOSIS — F4310 Post-traumatic stress disorder, unspecified: Secondary | ICD-10-CM

## 2017-01-28 MED ORDER — TRAZODONE HCL 50 MG PO TABS: 25.0000 mg | ORAL_TABLET | Freq: Every evening | ORAL | 3 refills | Status: DC | PRN

## 2017-01-28 MED ORDER — PANTOPRAZOLE SODIUM 40 MG PO TBEC: 40.0000 mg | DELAYED_RELEASE_TABLET | Freq: Every day | ORAL | 3 refills | Status: DC

## 2017-01-28 MED ORDER — GABAPENTIN 300 MG PO CAPS: 300.0000 mg | ORAL_CAPSULE | Freq: Three times a day (TID) | ORAL | 3 refills | Status: DC

## 2017-01-28 NOTE — Assessment & Plan Note (Signed)
Uses a compounded Bactroban rinse.  Will refill

## 2017-01-28 NOTE — Telephone Encounter (Signed)
Copied from [place] #[id number]. Topic: Quick Communication - See Telephone Encounter >> Jan 28, 2017  4:06 PM Boyd Kerbs wrote: CRM for notification. See Telephone encounter for:  01/28/17. Patient called pharmacy did not have the prescription for Trazadone 50 mg. All other prescripts [facility]. Were received.  Wants to see if could be sent and she pick it up tomorrow.  Please call patient when done.

## 2017-01-28 NOTE — Assessment & Plan Note (Signed)
Pt tried Gabapentin and it helped.  Will start 300 mg to titrate up to 3 times/day.  Increase to TID.

## 2017-01-28 NOTE — Assessment & Plan Note (Signed)
Start Trazadone 50 mg I hour before bed.

## 2017-01-28 NOTE — Patient Instructions (Addendum)
Influenza (Flu) Vaccine (Inactivated or Recombinant): What You Need to Know 1. Why get vaccinated? Influenza ("flu") is a contagious disease that spreads around the [place] every year, usually between October and May. Flu is caused by influenza viruses, and is spread mainly by coughing, sneezing, and close contact. Anyone can get flu. Flu strikes suddenly and can last several days. Symptoms vary by age, but can include:  fever/chills  sore throat  muscle aches  fatigue  cough  headache  runny or stuffy nose  Flu can also lead to pneumonia and blood infections, and cause diarrhea and seizures in children. If you have a medical condition, such as heart or lung disease, flu can make it worse. Flu is more dangerous for some people. Infants and young children, people 23 years of age and older, pregnant women, and people with certain health conditions or a weakened immune system are at greatest risk. Each year thousands of people in the [place] die from flu, and many more are hospitalized. Flu vaccine can:  keep you from getting flu,  make flu less severe if you do get it, and  keep you from spreading flu to your family and other people. 2. Inactivated and recombinant flu vaccines A dose of flu vaccine is recommended every flu season. Children 6 months through 91 years of age may need two doses during the same flu season. Everyone else needs only one dose each flu season. Some inactivated flu vaccines contain a very small amount of a mercury-based preservative called thimerosal. Studies have not shown thimerosal in vaccines to be harmful, but flu vaccines that do not contain thimerosal are available. There is no live flu virus in flu shots. They cannot cause the flu. There are many flu viruses, and they are always changing. Each year a new flu vaccine is made to protect against three or four viruses that are likely to cause disease in the upcoming flu season. But even when the  vaccine doesn't exactly match these viruses, it may still provide some protection. Flu vaccine cannot prevent:  flu that is caused by a virus not covered by the vaccine, or  illnesses that look like flu but are not.  It takes about 2 weeks for protection to develop after vaccination, and protection lasts through the flu season. 3. Some people should not get this vaccine Tell the person who is giving you the vaccine:  If you have any severe, life-threatening allergies. If you ever had a life-threatening allergic reaction after a dose of flu vaccine, or have a severe allergy to any part of this vaccine, you may be advised not to get vaccinated. Most, but not all, types of flu vaccine contain a small amount of egg protein.  If you ever had Guillain-Barr Syndrome (also called GBS). Some people with a history of GBS should not get this vaccine. This should be discussed with your doctor.  If you are not feeling well. It is usually okay to get flu vaccine when you have a mild illness, but you might be asked to come back when you feel better.  4. Risks of a vaccine reaction With any medicine, including vaccines, there is a chance of reactions. These are usually mild and go away on their own, but serious reactions are also possible. Most people who get a flu shot do not have any problems with it. Minor problems following a flu shot include:  soreness, redness, or swelling where the shot was given  hoarseness  sore,  red or itchy eyes  cough  fever  aches  headache  itching  fatigue  If these problems occur, they usually begin soon after the shot and last 1 or 2 days. More serious problems following a flu shot can include the following:  There may be a small increased risk of Guillain-Barre Syndrome (GBS) after inactivated flu vaccine. This risk has been estimated at 1 or 2 additional cases per million people vaccinated. This is much lower than the risk of severe complications from  flu, which can be prevented by flu vaccine.  Young children who get the flu shot along with pneumococcal vaccine (PCV13) and/or DTaP vaccine at the same time might be slightly more likely to have a seizure caused by fever. Ask your doctor for more information. Tell your doctor if a child who is getting flu vaccine has ever had a seizure.  Problems that could happen after any injected vaccine:  People sometimes faint after a medical procedure, including vaccination. Sitting or lying down for about 15 minutes can help prevent fainting, and injuries caused by a fall. Tell your doctor if you feel dizzy, or have vision changes or ringing in the ears.  Some people get severe pain in the shoulder and have difficulty moving the arm where a shot was given. This happens very rarely.  Any medication can cause a severe allergic reaction. Such reactions from a vaccine are very rare, estimated at about 1 in a million doses, and would happen within a few minutes to a few hours after the vaccination. As with any medicine, there is a very remote chance of a vaccine causing a serious injury or death. The safety of vaccines is always being monitored. For more information, visit: http://www.aguilar.org/ 5. What if there is a serious reaction? What should I look for? Look for anything that concerns you, such as signs of a severe allergic reaction, very high fever, or unusual behavior. Signs of a severe allergic reaction can include hives, swelling of the face and throat, difficulty breathing, a fast heartbeat, dizziness, and weakness. These would start a few minutes to a few hours after the vaccination. What should I do?  If you think it is a severe allergic reaction or other emergency that can't wait, call 9-1-1 and get the person to the nearest hospital. Otherwise, call your doctor.  Reactions should be reported to the Vaccine Adverse Event Reporting System (VAERS). Your doctor should file this report, or you  can do it yourself through the VAERS web site at www.vaers.SamedayNews.es, or by calling 6094730752. ? VAERS does not give medical advice. 6. The National Vaccine Injury Compensation Program The [employer] Vaccine Injury Compensation Program (VICP) is a federal program that was created to compensate people who may have been injured by certain vaccines. Persons who believe they may have been injured by a vaccine can learn about the program and about filing a claim by calling 458-267-6070 or visiting the [place] website at GoldCloset.com.ee. There is a time limit to file a claim for compensation. 7. How can I learn more?  Ask your healthcare provider. He or she can give you the vaccine package insert or suggest other sources of information.  Call your local or state health department.  Contact the Centers for Disease Control and Prevention (CDC): ? Call (540)164-9661 (1-800-CDC-INFO) or ? Visit CDC's website at https://gibson.com/ Vaccine Information Statement, Inactivated Influenza Vaccine (11/05/2013) This information is not intended to replace advice given to you by your health care provider. Make sure  you discuss any questions you have with your health care provider. Document Released: 01/10/2006 Document Revised: 12/07/2015 Document Reviewed: 12/07/2015 Elsevier Interactive Patient Education  2017 Elsevier Inc. Tdap Vaccine (Tetanus, Diphtheria and Pertussis): What You Need to Know 1. Why get vaccinated? Tetanus, diphtheria and pertussis are very serious diseases. Tdap vaccine can protect us from these diseases. And, Tdap vaccine given to pregnant women can protect newborn babies against pertussis. TETANUS (Lockjaw) is rare in the [place] today. It causes painful muscle tightening and stiffness, usually all over the body.  It can lead to tightening of muscles in the head and neck so you can't open your mouth, swallow, or sometimes even breathe. Tetanus kills about 1 out of 10  people who are infected even after receiving the best medical care.  DIPHTHERIA is also rare in the [place] today. It can cause a thick coating to form in the back of the throat.  It can lead to breathing problems, heart failure, paralysis, and death.  PERTUSSIS (Whooping Cough) causes severe coughing spells, which can cause difficulty breathing, vomiting and disturbed sleep.  It can also lead to weight loss, incontinence, and rib fractures. Up to 2 in 100 adolescents and 5 in 100 adults with pertussis are hospitalized or have complications, which could include pneumonia or death.  These diseases are caused by bacteria. Diphtheria and pertussis are spread from person to person through secretions from coughing or sneezing. Tetanus enters the body through cuts, scratches, or wounds. Before vaccines, as many as 200,000 cases of diphtheria, 200,000 cases of pertussis, and hundreds of cases of tetanus, were reported in the [place] each year. Since vaccination began, reports of cases for tetanus and diphtheria have dropped by about 99% and for pertussis by about 80%. 2. Tdap vaccine Tdap vaccine can protect adolescents and adults from tetanus, diphtheria, and pertussis. One dose of Tdap is routinely given at age 11 or 12. People who did not get Tdap at that age should get it as soon as possible. Tdap is especially important for healthcare professionals and anyone having close contact with a baby younger than 12 months. Pregnant women should get a dose of Tdap during every pregnancy, to protect the newborn from pertussis. Infants are most at risk for severe, life-threatening complications from pertussis. Another vaccine, called Td, protects against tetanus and diphtheria, but not pertussis. A Td booster should be given every 10 years. Tdap may be given as one of these boosters if you have never gotten Tdap before. Tdap may also be given after a severe cut or burn to prevent tetanus  infection. Your doctor or the person giving you the vaccine can give you more information. Tdap may safely be given at the same time as other vaccines. 3. Some people should not get this vaccine  A person who has ever had a life-threatening allergic reaction after a previous dose of any diphtheria, tetanus or pertussis containing vaccine, OR has a severe allergy to any part of this vaccine, should not get Tdap vaccine. Tell the person giving the vaccine about any severe allergies.  Anyone who had coma or long repeated seizures within 7 days after a childhood dose of DTP or DTaP, or a previous dose of Tdap, should not get Tdap, unless a cause other than the vaccine was found. They can still get Td.  Talk to your doctor if you: ? have seizures or another nervous system problem, ? had severe pain or swelling after any vaccine containing diphtheria,   tetanus or pertussis, ? ever had a condition called Guillain-Barr Syndrome (GBS), ? aren't feeling well on the day the shot is scheduled. 4. Risks With any medicine, including vaccines, there is a chance of side effects. These are usually mild and go away on their own. Serious reactions are also possible but are rare. Most people who get Tdap vaccine do not have any problems with it. Mild problems following Tdap: (Did not interfere with activities)  Pain where the shot was given (about 3 in 4 adolescents or 2 in 3 adults)  Redness or swelling where the shot was given (about 1 person in 5)  Mild fever of at least 100.4F (up to about 1 in 25 adolescents or 1 in 100 adults)  Headache (about 3 or 4 people in 10)  Tiredness (about 1 person in 3 or 4)  Nausea, vomiting, diarrhea, stomach ache (up to 1 in 4 adolescents or 1 in 10 adults)  Chills, sore joints (about 1 person in 10)  Body aches (about 1 person in 3 or 4)  Rash, swollen glands (uncommon)  Moderate problems following Tdap: (Interfered with activities, but did not require medical  attention)  Pain where the shot was given (up to 1 in 5 or 6)  Redness or swelling where the shot was given (up to about 1 in 16 adolescents or 1 in 12 adults)  Fever over 102F (about 1 in 100 adolescents or 1 in 250 adults)  Headache (about 1 in 7 adolescents or 1 in 10 adults)  Nausea, vomiting, diarrhea, stomach ache (up to 1 or 3 people in 100)  Swelling of the entire arm where the shot was given (up to about 1 in 500).  Severe problems following Tdap: (Unable to perform usual activities; required medical attention)  Swelling, severe pain, bleeding and redness in the arm where the shot was given (rare).  Problems that could happen after any vaccine:  People sometimes faint after a medical procedure, including vaccination. Sitting or lying down for about 15 minutes can help prevent fainting, and injuries caused by a fall. Tell your doctor if you feel dizzy, or have vision changes or ringing in the ears.  Some people get severe pain in the shoulder and have difficulty moving the arm where a shot was given. This happens very rarely.  Any medication can cause a severe allergic reaction. Such reactions from a vaccine are very rare, estimated at fewer than 1 in a million doses, and would happen within a few minutes to a few hours after the vaccination. As with any medicine, there is a very remote chance of a vaccine causing a serious injury or death. The safety of vaccines is always being monitored. For more information, visit: www.cdc.gov/vaccinesafety/ 5. What if there is a serious problem? What should I look for? Look for anything that concerns you, such as signs of a severe allergic reaction, very high fever, or unusual behavior. Signs of a severe allergic reaction can include hives, swelling of the face and throat, difficulty breathing, a fast heartbeat, dizziness, and weakness. These would usually start a few minutes to a few hours after the vaccination. What should I do?  If  you think it is a severe allergic reaction or other emergency that can't wait, call 9-1-1 or get the person to the nearest hospital. Otherwise, call your doctor.  Afterward, the reaction should be reported to the Vaccine Adverse Event Reporting System (VAERS). Your doctor might file this report, or you can do   it yourself through the VAERS web site at www.vaers.hhs.gov, or by calling 1-800-822-7967. ? VAERS does not give medical advice. 6. The National Vaccine Injury Compensation Program The National Vaccine Injury Compensation Program (VICP) is a federal program that was created to compensate people who may have been injured by certain vaccines. Persons who believe they may have been injured by a vaccine can learn about the program and about filing a claim by calling 1-800-338-2382 or visiting the VICP website at www.hrsa.gov/vaccinecompensation. There is a time limit to file a claim for compensation. 7. How can I learn more?  Ask your doctor. He or she can give you the vaccine package insert or suggest other sources of information.  Call your local or state health department.  Contact the Centers for Disease Control and Prevention (CDC): ? Call 1-800-232-4636 (1-800-CDC-INFO) or ? Visit CDC's website at www.cdc.gov/vaccines CDC Tdap Vaccine VIS (05/25/13) This information is not intended to replace advice given to you by your health care provider. Make sure you discuss any questions you have with your health care provider. Document Released: 09/17/2011 Document Revised: 12/07/2015 Document Reviewed: 12/07/2015 Elsevier Interactive Patient Education  2017 Elsevier Inc.  

## 2017-01-28 NOTE — Progress Notes (Signed)
BP 134/79   Pulse 71   Temp 99.1 F (37.3 C)   Ht 5' 3.7" (1.618 m)   Wt 222 lb 3.2 oz (100.8 kg)   SpO2 97%   BMI 38.50 kg/m    Subjective:    Patient ID: Sydney Valenzuela, female    DOB: [birth year], 36 y.o.   MRN: [id number]  HPI: Sydney Valenzuela is a 36 y.o. female  Chief Complaint  Patient presents with  . Establish Care   Anxiety Pt states she has always had anxiety issues.  Hard to focus and distracted.  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  States this is her biggest priority.  History of PTSD.  Tried several medications which seemed to make things worse.  She has tried Celexa, Zoloft, Wellbutrin, Buspar. Clonazepam and something with "a funny name." She she took Gabapentin given from her mother in law worked.    Feels she needs to take control of her life.  Her daughter has had a opioid addiction.  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  Has 3 grandchildren.  She is exhausted and nerves are frayed most of the time  Depression screen PHQ 2/9 01/28/2017  Decreased Interest 1  Down, Depressed, Hopeless 0  PHQ - 2 Score 1  Altered sleeping 3  Tired, decreased energy 3  Change in appetite 1  Feeling bad or failure about yourself  0  Trouble concentrating 3  Moving slowly or fidgety/restless 1  Suicidal thoughts 0  PHQ-9 Score 12   Neck, shoulders, and back pain Pt states she has neck, shoulder, and back pain.  Frequently neck pain radiates up to her head and gives migraines.    Knee pain Since 4 years ago when tried to jump on a sled.  States it's painful and pops and grinds.  Dull-sharp pain.  States it catches and pops.    Allergy  Common allergic rhinitis.  Uses a nasal rinse which often   Insomnia Sleeps only with Benadryl and Melatonin and then only 4-5 hours.    Relevant past medical, surgical, family and social history reviewed and updated as indicated. Interim medical history since our last visit reviewed. Allergies and  medications reviewed and updated.  Review of Systems  Constitutional: Positive for fatigue.  HENT: Negative.   Respiratory: Negative.   Cardiovascular: Negative.   Gastrointestinal: Positive for abdominal distention.       History of hiatal hernia  Endocrine: Negative.   Genitourinary: Positive for frequency.  Musculoskeletal: Positive for arthralgias and back pain.  Skin: Negative.   Allergic/Immunologic: Negative.   Neurological: Positive for headaches.  Hematological: Negative.   Psychiatric/Behavioral: Positive for agitation and decreased concentration. The patient is nervous/anxious.    Per HPI unless specifically indicated above     Objective:    BP 134/79   Pulse 71   Temp 99.1 F (37.3 C)   Ht 5' 3.7" (1.618 m)   Wt 222 lb 3.2 oz (100.8 kg)   SpO2 97%   BMI 38.50 kg/m   Wt Readings from Last 3 Encounters:  01/28/17 222 lb 3.2 oz (100.8 kg)  11/28/16 226 lb (102.5 kg)  11/21/16 223 lb (101.2 kg)    Physical Exam  Constitutional: She is oriented to person, place, and time. She appears well-developed and well-nourished. No distress.  HENT:  Head: Normocephalic and atraumatic.  Eyes: Conjunctivae and lids are normal. Right eye exhibits no discharge. Left eye exhibits no  discharge. No scleral icterus.  Cardiovascular: Normal rate.   Pulmonary/Chest: Effort normal.  Abdominal: Normal appearance. There is no splenomegaly or hepatomegaly.  Musculoskeletal: Normal range of motion.  Neurological: She is alert and oriented to person, place, and time.  Skin: Skin is intact. No rash noted. No pallor.  Psychiatric: She has a normal mood and affect. Her behavior is normal. Judgment and thought content normal.    Results for orders placed or performed in visit on 12/09/16  CBC with Differential/Platelet  Result Value Ref Range   WBC 11.2 (H) 3.4 - 10.8 x10E3/uL   RBC 4.92 3.77 - 5.28 x10E6/uL   Hemoglobin 14.5 11.1 - 15.9 g/dL   Hematocrit 43.3 34.0 - 46.6 %   MCV 88  79 - 97 fL   MCH 29.5 26.6 - 33.0 pg   MCHC 33.5 31.5 - 35.7 g/dL   RDW 13.2 12.3 - 15.4 %   Platelets 253 150 - 379 x10E3/uL   Neutrophils 77 Not Estab. %   Lymphs 18 Not Estab. %   Monocytes 4 Not Estab. %   Eos 1 Not Estab. %   Basos 0 Not Estab. %   Neutrophils Absolute 8.4 (H) 1.4 - 7.0 x10E3/uL   Lymphocytes Absolute 2.1 0.7 - 3.1 x10E3/uL   Monocytes Absolute 0.5 0.1 - 0.9 x10E3/uL   EOS (ABSOLUTE) 0.1 0.0 - 0.4 x10E3/uL   Basophils Absolute 0.1 0.0 - 0.2 x10E3/uL   Immature Granulocytes 0 Not Estab. %   Immature Grans (Abs) 0.0 0.0 - 0.1 x10E3/uL  Hepatic function panel  Result Value Ref Range   Total Protein 6.5 6.0 - 8.5 g/dL   Albumin 4.2 3.5 - 5.5 g/dL   Bilirubin Total 0.4 0.0 - 1.2 mg/dL   Bilirubin, Direct 0.13 0.00 - 0.40 mg/dL   Alkaline Phosphatase 116 39 - 117 IU/L   AST 14 0 - 40 IU/L   ALT 21 0 - 32 IU/L  Lipase  Result Value Ref Range   Lipase 34 14 - 72 U/L      Assessment & Plan:   Problem List Items Addressed This Visit      Unprioritized   Allergic rhinitis    Uses a compounded Bactroban rinse.  Will refill      Anxiety   Relevant Orders   Ambulatory referral to Psychiatry   Back pain   Depression, major, single episode, severe ([place])   Relevant Orders   Ambulatory referral to Psychiatry   Fibromyalgia    Pt tried Gabapentin and it helped.  Will start 300 mg to titrate up to 3 times/day.  Increase to TID.        Knee pain, left    Suspect meniscus tear.  Refer to Orthopedics.        Relevant Orders   Ambulatory referral to Orthopedic Surgery   Neck pain   PTSD (post-traumatic stress disorder)   Relevant Orders   Ambulatory referral to Psychiatry   Shoulder pain    Other Visit Diagnoses    Need for influenza vaccination    -  Primary   Relevant Orders   Flu Vaccine QUAD 36+ mos IM (Completed)   Need for Tdap vaccination       Relevant Orders   Tdap vaccine greater than or equal to 7yo IM (Completed)     Pt with multiple  co-morbid psychiatric issues.  Anxiety, depression, PTSD, stress, and ? ADHD.  Refer to psychiatry.     Follow up plan: Return in  about 4 weeks (around 02/25/2017) for for weeks.

## 2017-01-28 NOTE — Assessment & Plan Note (Addendum)
Suspect meniscus tear.  Refer to Orthopedics.

## 2017-01-29 NOTE — Telephone Encounter (Signed)
Left message for patient to check with pharmacy- Rx was sent on 10/30 with 2 RF. If not there please call back.

## 2017-01-29 NOTE — Telephone Encounter (Signed)
Confirmed with pharmacy- Rx is ready for pick up.

## 2017-02-11 ENCOUNTER — Ambulatory Visit: Payer: PRIVATE HEALTH INSURANCE | Admitting: Psychiatry

## 2017-02-26 ENCOUNTER — Ambulatory Visit (INDEPENDENT_AMBULATORY_CARE_PROVIDER_SITE_OTHER): Payer: PRIVATE HEALTH INSURANCE | Admitting: Unknown Physician Specialty

## 2017-02-26 ENCOUNTER — Encounter: Payer: Self-pay | Admitting: Unknown Physician Specialty

## 2017-02-26 ENCOUNTER — Other Ambulatory Visit: Payer: Self-pay | Admitting: Unknown Physician Specialty

## 2017-02-26 VITALS — BP 130/89 | HR 73 | Temp 97.7°F | Wt 233.2 lb

## 2017-02-26 DIAGNOSIS — K219 Gastro-esophageal reflux disease without esophagitis: Secondary | ICD-10-CM

## 2017-02-26 DIAGNOSIS — Z0000 Encounter for general adult medical examination without abnormal findings: Secondary | ICD-10-CM | POA: Diagnosis not present

## 2017-02-26 DIAGNOSIS — J0100 Acute maxillary sinusitis, unspecified: Secondary | ICD-10-CM

## 2017-02-26 DIAGNOSIS — M797 Fibromyalgia: Secondary | ICD-10-CM | POA: Diagnosis not present

## 2017-02-26 DIAGNOSIS — D229 Melanocytic nevi, unspecified: Secondary | ICD-10-CM | POA: Diagnosis not present

## 2017-02-26 DIAGNOSIS — F5101 Primary insomnia: Secondary | ICD-10-CM | POA: Diagnosis not present

## 2017-02-26 DIAGNOSIS — F322 Major depressive disorder, single episode, severe without psychotic features: Secondary | ICD-10-CM

## 2017-02-26 MED ORDER — AMOXICILLIN 875 MG PO TABS: 875.0000 mg | ORAL_TABLET | Freq: Two times a day (BID) | ORAL | 0 refills | Status: DC

## 2017-02-26 MED ORDER — PANTOPRAZOLE SODIUM 40 MG PO TBEC: 40.0000 mg | DELAYED_RELEASE_TABLET | Freq: Two times a day (BID) | ORAL | 3 refills | Status: DC

## 2017-02-26 MED ORDER — GABAPENTIN 800 MG PO TABS: 800.0000 mg | ORAL_TABLET | Freq: Three times a day (TID) | ORAL | 5 refills | Status: DC

## 2017-02-26 MED ORDER — QUETIAPINE FUMARATE 50 MG PO TABS: 100.0000 mg | ORAL_TABLET | Freq: Every day | ORAL | 0 refills | Status: DC

## 2017-02-26 NOTE — Telephone Encounter (Signed)
Copied from [place] [id number]. Topic: General - Other >> Feb 26, 2017  3:34 PM Neva Seat wrote: Needs med prescribed today sent to [facility] on [address]

## 2017-02-26 NOTE — Progress Notes (Addendum)
BP 130/89   Pulse 73   Temp 97.7 F (36.5 C) (Oral)   Wt 233 lb 3.2 oz (105.8 kg)   SpO2 98%   BMI 40.41 kg/m    Subjective:    Patient ID: Sydney Valenzuela, female    DOB: [birth year], 36 y.o.   MRN: [id number]  HPI: Sydney Valenzuela is a 36 y.o. female  Chief Complaint  Patient presents with  . Annual Exam    pt states she is ok with having HIV lab done  . Medication Problem    pt states she would like to discuss gabapentin, pantoprazole, and trazadone- states none of these are working    Anxiety/PTSD States the dose of Gabapentin she tried was 800 mg and would like to try a higher dose to see if it helps. She has an appt with psychiatry.    Insomnia Trazadone didn't help with sleep.  Even took 3 of them.   Feels she has "bugs crawling on me at night."    Fibromyalgia Would like to increase Gabapentin as above.   GERD  RX says once a day.  Rx says twice a day  Sinusitis  This is a new problem. Episode onset: 2 weeks. The problem has been gradually worsening since onset. There has been no fever. Associated symptoms include congestion, ear pain, headaches, a hoarse voice and sinus pressure. Pertinent negatives include no chills, coughing, shortness of breath, sneezing, sore throat or swollen glands. Past treatments include nothing. The treatment provided no relief.   Social History   Socioeconomic History  . Marital status: Married    Spouse name: Not on file  . Number of children: Not on file  . Years of education: Not on file  . Highest education level: Not on file  Social Needs  . Financial resource strain: Not on file  . Food insecurity - worry: Not on file  . Food insecurity - inability: Not on file  . Transportation needs - medical: Not on file  . Transportation needs - non-medical: Not on file  Occupational History  . Not on file  Tobacco Use  . Smoking status: Current Every Day Smoker    Packs/day: 0.50  . Smokeless tobacco: Never Used  Substance and  Sexual Activity  . Alcohol use: Yes    Comment: occasional  . Drug use: No  . Sexual activity: Yes    Birth control/protection: IUD  Other Topics Concern  . Not on file  Social History Narrative  . Not on file   Family History  Problem Relation Age of Onset  . Hypertension Mother   . Allergies Mother   . Hyperlipidemia Mother   . Anxiety disorder Daughter   . Hypertension Son   . Cancer Maternal Uncle        prostate  . Diabetes Maternal Uncle   . Heart disease Maternal Uncle   . Allergies Maternal Grandmother   . COPD Maternal Grandmother   . Alzheimer's disease Maternal Grandmother   . Diabetes Maternal Grandfather   . Heart disease Maternal Grandfather    Past Medical History:  Diagnosis Date  . Allergy   . Anxiety   . Arthritis   . Family history of adverse reaction to anesthesia    N/V, difficult to wake up  . Hiatal hernia   . Hypertension    Past Surgical History:  Procedure Laterality Date  . CHOLECYSTECTOMY N/A 11/21/2016   Procedure: LAPAROSCOPIC CHOLECYSTECTOMY WITH INTRAOPERATIVE CHOLANGIOGRAM;  Surgeon: Junie Panning  G, MD;  Location: [facility] ORS;  Service: General;  Laterality: N/A;  . ESOPHAGOGASTRODUODENOSCOPY (EGD) WITH PROPOFOL N/A 11/04/2016   Procedure: ESOPHAGOGASTRODUODENOSCOPY (EGD) WITH PROPOFOL;  Surgeon: Manya Silvas, MD;  Location: [facility] ENDOSCOPY;  Service: Endoscopy;  Laterality: N/A;  . LEEP    . NASAL SINUS SURGERY    . TONSILLECTOMY      Relevant past medical, surgical, family and social history reviewed and updated as indicated. Interim medical history since our last visit reviewed. Allergies and medications reviewed and updated.  Review of Systems  Constitutional: Negative for chills.  HENT: Positive for congestion, ear pain, hoarse voice and sinus pressure. Negative for sneezing and sore throat.   Respiratory: Negative for cough and shortness of breath.   Neurological: Positive for headaches.   Per HPI unless  specifically indicated above     Objective:    BP 130/89   Pulse 73   Temp 97.7 F (36.5 C) (Oral)   Wt 233 lb 3.2 oz (105.8 kg)   SpO2 98%   BMI 40.41 kg/m   Wt Readings from Last 3 Encounters:  02/26/17 233 lb 3.2 oz (105.8 kg)  01/28/17 222 lb 3.2 oz (100.8 kg)  11/28/16 226 lb (102.5 kg)    Physical Exam  Constitutional: She is oriented to person, place, and time. She appears well-developed and well-nourished. No distress.  HENT:  Head: Normocephalic and atraumatic.  Eyes: Conjunctivae and lids are normal. Right eye exhibits no discharge. Left eye exhibits no discharge. No scleral icterus.  Neck: Normal range of motion. Neck supple. No JVD present. Carotid bruit is not present.  Cardiovascular: Normal rate, regular rhythm and normal heart sounds.  Pulmonary/Chest: Effort normal and breath sounds normal.  Abdominal: Normal appearance. There is no splenomegaly or hepatomegaly.  Musculoskeletal: Normal range of motion.  Neurological: She is alert and oriented to person, place, and time.  Skin: Skin is warm, dry and intact. No rash noted. No pallor.  Atypical nevi on back x2  Psychiatric: She has a normal mood and affect. Her behavior is normal. Judgment and thought content normal.    Results for orders placed or performed in visit on 12/09/16  CBC with Differential/Platelet  Result Value Ref Range   WBC 11.2 (H) 3.4 - 10.8 x10E3/uL   RBC 4.92 3.77 - 5.28 x10E6/uL   Hemoglobin 14.5 11.1 - 15.9 g/dL   Hematocrit 43.3 34.0 - 46.6 %   MCV 88 79 - 97 fL   MCH 29.5 26.6 - 33.0 pg   MCHC 33.5 31.5 - 35.7 g/dL   RDW 13.2 12.3 - 15.4 %   Platelets 253 150 - 379 x10E3/uL   Neutrophils 77 Not Estab. %   Lymphs 18 Not Estab. %   Monocytes 4 Not Estab. %   Eos 1 Not Estab. %   Basos 0 Not Estab. %   Neutrophils Absolute 8.4 (H) 1.4 - 7.0 x10E3/uL   Lymphocytes Absolute 2.1 0.7 - 3.1 x10E3/uL   Monocytes Absolute 0.5 0.1 - 0.9 x10E3/uL   EOS (ABSOLUTE) 0.1 0.0 - 0.4 x10E3/uL     Basophils Absolute 0.1 0.0 - 0.2 x10E3/uL   Immature Granulocytes 0 Not Estab. %   Immature Grans (Abs) 0.0 0.0 - 0.1 x10E3/uL  Hepatic function panel  Result Value Ref Range   Total Protein 6.5 6.0 - 8.5 g/dL   Albumin 4.2 3.5 - 5.5 g/dL   Bilirubin Total 0.4 0.0 - 1.2 mg/dL   Bilirubin, Direct 0.13 0.00 - 0.40 mg/dL  Alkaline Phosphatase 116 39 - 117 IU/L   AST 14 0 - 40 IU/L   ALT 21 0 - 32 IU/L  Lipase  Result Value Ref Range   Lipase 34 14 - 72 U/L      Assessment & Plan:   Problem List Items Addressed This Visit      Unprioritized   Atypical nevi    Schedule mole removal      Depression, major, single episode, severe ([place])    Per psychiatry      Esophageal reflux    Change Protonix rx to 2/day.  This is required to control symptoms      Relevant Medications   pantoprazole (PROTONIX) 40 MG tablet   Fibromyalgia    Increase Gabapentin to 800 mg TID.  Discussed titration with 1/2 pills.        Primary insomnia    Start Seroquel 50 mg.  1-2 tablets QHS.  Further insomnia treatment with psychiatry and discussed Seroquel may be changed       Other Visit Diagnoses    Annual physical exam    -  Primary   Relevant Orders   Comprehensive metabolic panel   Lipid Panel w/o Chol/HDL Ratio   TSH   CBC with Differential/Platelet   IGP, Aptima HPV, rfx 16/18,45   Acute non-recurrent maxillary sinusitis       Rx for Amoxil for sinusitis   Relevant Medications   amoxicillin (AMOXIL) 875 MG tablet      F/u with psychiatry for PTSD, insomnia, and anxiety  Follow up plan: Return for schedule mole removal.  30 minutes.

## 2017-02-26 NOTE — Assessment & Plan Note (Signed)
Increase Gabapentin to 800 mg TID.  Discussed titration with 1/2 pills.

## 2017-02-26 NOTE — Telephone Encounter (Signed)
Routing to provider. Pharmacy changed per patient request in chart.

## 2017-02-26 NOTE — Telephone Encounter (Signed)
Called and let patient know that her prescriptions have been sent to [facility] for her.

## 2017-02-26 NOTE — Assessment & Plan Note (Signed)
Start Seroquel 50 mg.  1-2 tablets QHS.  Further insomnia treatment with psychiatry and discussed Seroquel may be changed

## 2017-02-26 NOTE — Assessment & Plan Note (Signed)
Change Protonix rx to 2/day.  This is required to control symptoms

## 2017-02-26 NOTE — Assessment & Plan Note (Signed)
Per psychiatry 

## 2017-02-26 NOTE — Assessment & Plan Note (Signed)
Schedule mole removal

## 2017-02-26 NOTE — Addendum Note (Signed)
Addended by: Kathrine Haddock on: 02/26/2017 03:30 PM   Modules accepted: Orders

## 2017-02-27 ENCOUNTER — Encounter: Payer: Self-pay | Admitting: Unknown Physician Specialty

## 2017-02-27 LAB — COMPREHENSIVE METABOLIC PANEL
A/G RATIO: 2 (ref 1.2–2.2)
ALBUMIN: 4.1 g/dL (ref 3.5–5.5)
ALT: 19 IU/L (ref 0–32)
AST: 14 IU/L (ref 0–40)
Alkaline Phosphatase: 104 IU/L (ref 39–117)
BUN / CREAT RATIO: 13 (ref 9–23)
BUN: 8 mg/dL (ref 6–20)
Bilirubin Total: 0.2 mg/dL (ref 0.0–1.2)
CALCIUM: 9.2 mg/dL (ref 8.7–10.2)
CO2: 20 mmol/L (ref 20–29)
Chloride: 105 mmol/L (ref 96–106)
Creatinine, Ser: 0.63 mg/dL (ref 0.57–1.00)
GFR, EST AFRICAN AMERICAN: 133 mL/min/{1.73_m2} (ref >59)
GFR, EST NON AFRICAN AMERICAN: 115 mL/min/{1.73_m2} (ref >59)
Globulin, Total: 2.1 g/dL (ref 1.5–4.5)
Glucose: 86 mg/dL (ref 65–99)
Potassium: 4.6 mmol/L (ref 3.5–5.2)
Sodium: 139 mmol/L (ref 134–144)
TOTAL PROTEIN: 6.2 g/dL (ref 6.0–8.5)

## 2017-02-27 LAB — CBC WITH DIFFERENTIAL/PLATELET
BASOS ABS: 0 10*3/uL (ref 0.0–0.2)
Basos: 0 %
EOS (ABSOLUTE): 0.1 10*3/uL (ref 0.0–0.4)
Eos: 1 %
HEMATOCRIT: 42.6 % (ref 34.0–46.6)
HEMOGLOBIN: 14 g/dL (ref 11.1–15.9)
Immature Grans (Abs): 0 10*3/uL (ref 0.0–0.1)
Immature Granulocytes: 0 %
LYMPHS ABS: 2.2 10*3/uL (ref 0.7–3.1)
Lymphs: 24 %
MCH: 29.4 pg (ref 26.6–33.0)
MCHC: 32.9 g/dL (ref 31.5–35.7)
MCV: 90 fL (ref 79–97)
MONOS ABS: 0.5 10*3/uL (ref 0.1–0.9)
Monocytes: 6 %
Neutrophils Absolute: 6.3 10*3/uL (ref 1.4–7.0)
Neutrophils: 69 %
Platelets: 253 10*3/uL (ref 150–379)
RBC: 4.76 x10E6/uL (ref 3.77–5.28)
RDW: 13.5 % (ref 12.3–15.4)
WBC: 9.2 10*3/uL (ref 3.4–10.8)

## 2017-02-27 LAB — LIPID PANEL W/O CHOL/HDL RATIO
Cholesterol, Total: 205 mg/dL — ABNORMAL HIGH (ref 100–199)
HDL: 59 mg/dL (ref >39)
LDL Calculated: 117 mg/dL — ABNORMAL HIGH (ref 0–99)
Triglycerides: 147 mg/dL (ref 0–149)
VLDL CHOLESTEROL CAL: 29 mg/dL (ref 5–40)

## 2017-02-27 LAB — TSH: TSH: 2.13 u[IU]/mL (ref 0.450–4.500)

## 2017-02-27 NOTE — Progress Notes (Signed)
Normal labs.  Patient notified by letter.

## 2017-03-01 LAB — IGP, APTIMA HPV, RFX 16/18,45
HPV Aptima: NEGATIVE
PAP SMEAR COMMENT: 0

## 2017-03-03 ENCOUNTER — Telehealth: Payer: Self-pay | Admitting: Unknown Physician Specialty

## 2017-03-03 NOTE — Telephone Encounter (Signed)
Unable to leave message due to mailbox full.

## 2017-03-04 ENCOUNTER — Telehealth: Payer: Self-pay | Admitting: Unknown Physician Specialty

## 2017-03-04 NOTE — Telephone Encounter (Signed)
Left message to call back  

## 2017-03-05 ENCOUNTER — Telehealth: Payer: Self-pay | Admitting: Unknown Physician Specialty

## 2017-03-05 MED ORDER — METRONIDAZOLE 500 MG PO TABS: 500.0000 mg | ORAL_TABLET | Freq: Three times a day (TID) | ORAL | 0 refills | Status: DC

## 2017-03-05 NOTE — Progress Notes (Signed)
Pt notified by phone.  Metronidazole called in

## 2017-03-05 NOTE — Telephone Encounter (Signed)
Pt called back to speak to [place]. She called her yesterday Malachy Mood) Please call back @ [phone].

## 2017-03-05 NOTE — Telephone Encounter (Signed)
Discussed with pt trichomonas.  Start Metronidazole

## 2017-03-17 ENCOUNTER — Ambulatory Visit (INDEPENDENT_AMBULATORY_CARE_PROVIDER_SITE_OTHER): Payer: PRIVATE HEALTH INSURANCE | Admitting: Unknown Physician Specialty

## 2017-03-17 ENCOUNTER — Encounter: Payer: Self-pay | Admitting: Unknown Physician Specialty

## 2017-03-17 VITALS — BP 135/91 | HR 99 | Temp 98.3°F | Wt 223.4 lb

## 2017-03-17 DIAGNOSIS — R05 Cough: Secondary | ICD-10-CM

## 2017-03-17 DIAGNOSIS — R509 Fever, unspecified: Secondary | ICD-10-CM | POA: Diagnosis not present

## 2017-03-17 DIAGNOSIS — R059 Cough, unspecified: Secondary | ICD-10-CM

## 2017-03-17 MED ORDER — ALBUTEROL SULFATE HFA 108 (90 BASE) MCG/ACT IN AERS: 1.0000 | INHALATION_SPRAY | Freq: Four times a day (QID) | RESPIRATORY_TRACT | 1 refills | Status: DC | PRN

## 2017-03-17 MED ORDER — DOXYCYCLINE HYCLATE 100 MG PO TABS: 100.0000 mg | ORAL_TABLET | Freq: Two times a day (BID) | ORAL | 0 refills | Status: DC

## 2017-03-17 MED ORDER — HYDROCOD POLST-CPM POLST ER 10-8 MG/5ML PO SUER: 5.0000 mL | Freq: Two times a day (BID) | ORAL | 0 refills | Status: DC | PRN

## 2017-03-17 NOTE — Progress Notes (Signed)
BP (!) 135/91   Pulse 99   Temp 98.3 F (36.8 C) (Oral)   Wt 223 lb 6.4 oz (101.3 kg)   SpO2 97%   BMI 38.71 kg/m    Subjective:    Patient ID: Sydney Valenzuela, female    DOB: [birth year], 36 y.o.   MRN: [id number]  HPI: JOYELL EMAMI is a 36 y.o. female  Chief Complaint  Patient presents with  . URI    pt states she has had a cough, congestion, sinus pressure, and left ear pain for about 13 days    Cough  This is a new problem. Episode onset: 14 days ago. The problem has been gradually worsening. The problem occurs constantly. Cough characteristics: at times. Associated symptoms include chills, a fever, nasal congestion, rhinorrhea, shortness of breath, sweats and wheezing. Pertinent negatives include no ear congestion, ear pain, heartburn, hemoptysis, myalgias, rash or sore throat. Associated symptoms comments: Over 102 at times. Nothing aggravates the symptoms. Risk factors for lung disease include smoking/tobacco exposure. She has tried OTC cough suppressant (Mucinex, sinus medications, Tylenol) for the symptoms.    Relevant past medical, surgical, family and social history reviewed and updated as indicated. Interim medical history since our last visit reviewed. Allergies and medications reviewed and updated.  Review of Systems  Constitutional: Positive for chills and fever.  HENT: Positive for rhinorrhea. Negative for ear pain and sore throat.   Respiratory: Positive for cough, shortness of breath and wheezing. Negative for hemoptysis.   Gastrointestinal: Negative for heartburn.  Musculoskeletal: Negative for myalgias.  Skin: Negative for rash.    Per HPI unless specifically indicated above     Objective:    BP (!) 135/91   Pulse 99   Temp 98.3 F (36.8 C) (Oral)   Wt 223 lb 6.4 oz (101.3 kg)   SpO2 97%   BMI 38.71 kg/m   Wt Readings from Last 3 Encounters:  03/17/17 223 lb 6.4 oz (101.3 kg)  02/26/17 233 lb 3.2 oz (105.8 kg)  01/28/17 222 lb 3.2 oz (100.8  kg)    Physical Exam  Constitutional: She is oriented to person, place, and time. She appears well-developed and well-nourished. No distress.  HENT:  Head: Normocephalic and atraumatic.  Right Ear: Tympanic membrane and ear canal normal.  Left Ear: Tympanic membrane and ear canal normal.  Nose: Rhinorrhea present. Right sinus exhibits no maxillary sinus tenderness and no frontal sinus tenderness. Left sinus exhibits no maxillary sinus tenderness and no frontal sinus tenderness.  Mouth/Throat: Mucous membranes are normal. Posterior oropharyngeal erythema present.  Eyes: Conjunctivae and lids are normal. Right eye exhibits no discharge. Left eye exhibits no discharge. No scleral icterus.  Cardiovascular: Normal rate and regular rhythm.  Pulmonary/Chest: Effort normal. No respiratory distress. She has rhonchi in the right lower field.  Abdominal: Normal appearance. There is no splenomegaly or hepatomegaly.  Musculoskeletal: Normal range of motion.  Neurological: She is alert and oriented to person, place, and time.  Skin: Skin is intact. No rash noted. No pallor.  Psychiatric: She has a normal mood and affect. Her behavior is normal. Judgment and thought content normal.    Results for orders placed or performed in visit on 02/26/17  Comprehensive metabolic panel  Result Value Ref Range   Glucose 86 65 - 99 mg/dL   BUN 8 6 - 20 mg/dL   Creatinine, Ser 0.63 0.57 - 1.00 mg/dL   GFR calc non Af Amer 115 >59 mL/min/1.73   GFR calc  Af Amer 133 >59 mL/min/1.73   BUN/Creatinine Ratio 13 9 - 23   Sodium 139 134 - 144 mmol/L   Potassium 4.6 3.5 - 5.2 mmol/L   Chloride 105 96 - 106 mmol/L   CO2 20 20 - 29 mmol/L   Calcium 9.2 8.7 - 10.2 mg/dL   Total Protein 6.2 6.0 - 8.5 g/dL   Albumin 4.1 3.5 - 5.5 g/dL   Globulin, Total 2.1 1.5 - 4.5 g/dL   Albumin/Globulin Ratio 2.0 1.2 - 2.2   Bilirubin Total 0.2 0.0 - 1.2 mg/dL   Alkaline Phosphatase 104 39 - 117 IU/L   AST 14 0 - 40 IU/L   ALT 19 0  - 32 IU/L  Lipid Panel w/o Chol/HDL Ratio  Result Value Ref Range   Cholesterol, Total 205 (H) 100 - 199 mg/dL   Triglycerides 147 0 - 149 mg/dL   HDL 59 >39 mg/dL   VLDL Cholesterol Cal 29 5 - 40 mg/dL   LDL Calculated 117 (H) 0 - 99 mg/dL  TSH  Result Value Ref Range   TSH 2.130 0.450 - 4.500 uIU/mL  CBC with Differential/Platelet  Result Value Ref Range   WBC 9.2 3.4 - 10.8 x10E3/uL   RBC 4.76 3.77 - 5.28 x10E6/uL   Hemoglobin 14.0 11.1 - 15.9 g/dL   Hematocrit 42.6 34.0 - 46.6 %   MCV 90 79 - 97 fL   MCH 29.4 26.6 - 33.0 pg   MCHC 32.9 31.5 - 35.7 g/dL   RDW 13.5 12.3 - 15.4 %   Platelets 253 150 - 379 x10E3/uL   Neutrophils 69 Not Estab. %   Lymphs 24 Not Estab. %   Monocytes 6 Not Estab. %   Eos 1 Not Estab. %   Basos 0 Not Estab. %   Neutrophils Absolute 6.3 1.4 - 7.0 x10E3/uL   Lymphocytes Absolute 2.2 0.7 - 3.1 x10E3/uL   Monocytes Absolute 0.5 0.1 - 0.9 x10E3/uL   EOS (ABSOLUTE) 0.1 0.0 - 0.4 x10E3/uL   Basophils Absolute 0.0 0.0 - 0.2 x10E3/uL   Immature Granulocytes 0 Not Estab. %   Immature Grans (Abs) 0.0 0.0 - 0.1 x10E3/uL  IGP, Aptima HPV, rfx 16/18,45  Result Value Ref Range   DIAGNOSIS: Comment    Specimen adequacy: Comment    Clinician Provided ICD10 Comment    Performed by: Comment    PAP Smear Comment .    Note: Comment    Test Methodology Comment    HPV Aptima Negative Negative      Assessment & Plan:   Problem List Items Addressed This Visit    None    Visit Diagnoses    Cough    -  Primary   New problem.  R/o atypical and get chest x-ray.  Rx for Doxycyclone.  Tussionex for cough.  Rx for inhaler to take q 4 hours prn   Relevant Orders   DG Chest 2 View   Fever, unspecified fever cause       OK for Tylenol.  Rest and fluids       Follow up plan: Return if symptoms worsen or fail to improve.

## 2017-03-20 ENCOUNTER — Other Ambulatory Visit: Payer: Self-pay | Admitting: Unknown Physician Specialty

## 2017-03-27 ENCOUNTER — Telehealth: Payer: Self-pay | Admitting: Unknown Physician Specialty

## 2017-03-27 NOTE — Telephone Encounter (Signed)
Copied from [place]. Topic: Quick Communication - See Telephone Encounter >> Mar 27, 2017  8:45 AM Ahmed Prima L wrote: CRM for notification. See Telephone encounter for:   03/27/17.  Pt said she saw [facility] on 12/17, she said she is not feeling any better & is coughing stuff up and has massive headaches all day long. She is blowing yellow stuff. She wants to know if she will call her in a steroid or something else, she states she starts back to school in a week and can not miss school. [facility]acy is [facility] [address]. Call back is [phone]

## 2017-03-27 NOTE — Telephone Encounter (Signed)
Routing to provider  

## 2017-03-28 MED ORDER — AMOXICILLIN-POT CLAVULANATE 875-125 MG PO TABS: 1.0000 | ORAL_TABLET | Freq: Two times a day (BID) | ORAL | 0 refills | Status: DC

## 2017-03-28 MED ORDER — PREDNISONE 20 MG PO TABS: 40.0000 mg | ORAL_TABLET | Freq: Every day | ORAL | 0 refills | Status: DC

## 2017-03-28 NOTE — Telephone Encounter (Signed)
Tried calling patient again, call went straight to VM. Still unable to leave VM ask mailbox was full.

## 2017-03-28 NOTE — Telephone Encounter (Signed)
If she is feeling no better, we probably need to change the antibiotic.  I can also call in a steroid

## 2017-03-28 NOTE — Telephone Encounter (Signed)
Tried calling patient to let her know that a new antibiotic and steroid (augmentin and prednisone) have been sent in for her to take. Patient did not answer and VM box was full so I was unable to leave a message. Will try to call again later. OK for PEC to let patient know about medications if she calls back.

## 2017-03-31 NOTE — Telephone Encounter (Signed)
Called pharmacy to see if medications were picked up over the weekend and they stated that they were not. Tried calling patient again to let her know about medications but she did not answer and I could not leave a VM as mailbox was full. Will try to call again later.

## 2017-03-31 NOTE — Telephone Encounter (Signed)
Tried calling patient again. Phone rang once and went to VM, could not leave VM as box was still full.

## 2017-04-10 ENCOUNTER — Telehealth: Payer: Self-pay | Admitting: Unknown Physician Specialty

## 2017-04-10 NOTE — Telephone Encounter (Signed)
Copied from [place] [id number]. Topic: Quick Communication - See Telephone Encounter >> Apr 10, 2017  1:58 PM Percell Belt A wrote: CRM for notification. See Telephone encounter for: pt called in and said that she is on Gabapentin  but she is on longer days working now and she said that the 3 a day is not enough.  She would like to know if a new script could be called in and up her dose?    [facility] - [facility] [address]  Best number -[phone]   04/10/17.

## 2017-04-14 NOTE — Telephone Encounter (Signed)
Physician not at this office

## 2017-06-24 ENCOUNTER — Other Ambulatory Visit: Payer: Self-pay | Admitting: Unknown Physician Specialty

## 2017-08-04 ENCOUNTER — Ambulatory Visit (INDEPENDENT_AMBULATORY_CARE_PROVIDER_SITE_OTHER): Payer: BLUE CROSS/BLUE SHIELD | Admitting: Unknown Physician Specialty

## 2017-08-04 ENCOUNTER — Encounter: Payer: Self-pay | Admitting: Unknown Physician Specialty

## 2017-08-04 VITALS — BP 126/87 | HR 83 | Temp 98.6°F | Ht 63.5 in | Wt 231.4 lb

## 2017-08-04 DIAGNOSIS — F322 Major depressive disorder, single episode, severe without psychotic features: Secondary | ICD-10-CM

## 2017-08-04 DIAGNOSIS — R635 Abnormal weight gain: Secondary | ICD-10-CM | POA: Diagnosis not present

## 2017-08-04 DIAGNOSIS — M797 Fibromyalgia: Secondary | ICD-10-CM | POA: Diagnosis not present

## 2017-08-04 DIAGNOSIS — M722 Plantar fascial fibromatosis: Secondary | ICD-10-CM | POA: Diagnosis not present

## 2017-08-04 DIAGNOSIS — D229 Melanocytic nevi, unspecified: Secondary | ICD-10-CM

## 2017-08-04 MED ORDER — DULOXETINE HCL 30 MG PO CPEP: ORAL_CAPSULE | ORAL | 3 refills | Status: DC

## 2017-08-04 MED ORDER — ALBUTEROL SULFATE HFA 108 (90 BASE) MCG/ACT IN AERS: 1.0000 | INHALATION_SPRAY | Freq: Four times a day (QID) | RESPIRATORY_TRACT | 1 refills | Status: DC | PRN

## 2017-08-04 MED ORDER — GABAPENTIN 800 MG PO TABS: 800.0000 mg | ORAL_TABLET | Freq: Three times a day (TID) | ORAL | 0 refills | Status: DC

## 2017-08-04 NOTE — Assessment & Plan Note (Signed)
Dermatology for a skin check

## 2017-08-04 NOTE — Assessment & Plan Note (Signed)
Improved.  Add Cymbalta for this and fibromyalgia.  Start 30 mg daily for 2 weeks then 60 mg.

## 2017-08-04 NOTE — Patient Instructions (Signed)
Low dose Naltrexone

## 2017-08-04 NOTE — Assessment & Plan Note (Signed)
She has tried conservative care.  Refer to podiatry

## 2017-08-04 NOTE — Assessment & Plan Note (Addendum)
Gabapentin seems to be not as effective.  Will continue for now.  Consider Lyrica and add Cymbalta.  Pt is asking to increase Gabapentin to QID.  OK for now but dose escalation I believe is unsustainable.

## 2017-08-04 NOTE — Progress Notes (Signed)
BP 126/87   Pulse 83   Temp 98.6 F (37 C) (Oral)   Ht 5' 3.5" (1.613 m)   Wt 231 lb 6.4 oz (105 kg)   SpO2 97%   BMI 40.35 kg/m    Subjective:    Patient ID: Sydney Valenzuela, female    DOB: 06-23-1980, 37 y.o.   MRN: [id number]  HPI: Sydney Valenzuela is a 37 y.o. female  Chief Complaint  Patient presents with  . Depression  . Plantar Fasciitis    right foot, tried water bottle and insoles  . Nevus  . Weight Gain  . Recurrent Skin Infections   Depression Doing a lot of stress reduction self-care.   She has not tried Cymbalta at this time.    Depression screen [facility] 2/9 08/04/2017 02/26/2017 01/28/2017  Decreased Interest 1 2 1   Down, Depressed, Hopeless 1 2 0  PHQ - 2 Score 2 4 1   Altered sleeping 1 3 3   Tired, decreased energy 2 3 3   Change in appetite 0 2 1  Feeling bad or failure about yourself  0 2 0  Trouble concentrating 1 2 3   Moving slowly or fidgety/restless 1 2 1   Suicidal thoughts 0 0 0  PHQ-9 Score 7 18 12    Chronic pain Pt with "lots of pain."  Mostly in the low back and hip area.  However, this travels and sometimes in her neck.  Taking Gabapentin 800 mg TID. States she is sometimes taking 4-5/day.  Takes a Gabapentin in the AM, Around 11A, mid afternoon, and another in the evening.  States it helps with pain.  She does admit to stress with school and work.    Plantar Faciitis Bottom of foot hurts so much she sometimes can't stand on it.  This is worse in the AM or when sitting and then standing up.  States she is using a frozen water bottle, using insoles and just wearing her sneakers.  Radiated up to her ankle.  Right is worse than left.    Wt gain Pt is very active. Eats a healthy diet, and can't figure why she can't lose weight.   Relevant past medical, surgical, family and social history reviewed and updated as indicated. Interim medical history since our last visit reviewed. Allergies and medications reviewed and updated.  Review of Systems  Per  HPI unless specifically indicated above     Objective:    BP 126/87   Pulse 83   Temp 98.6 F (37 C) (Oral)   Ht 5' 3.5" (1.613 m)   Wt 231 lb 6.4 oz (105 kg)   SpO2 97%   BMI 40.35 kg/m   Wt Readings from Last 3 Encounters:  08/04/17 231 lb 6.4 oz (105 kg)  03/17/17 223 lb 6.4 oz (101.3 kg)  02/26/17 233 lb 3.2 oz (105.8 kg)    Physical Exam  Constitutional: She is oriented to person, place, and time. She appears well-developed and well-nourished. No distress.  HENT:  Head: Normocephalic and atraumatic.  Eyes: Conjunctivae and lids are normal. Right eye exhibits no discharge. Left eye exhibits no discharge. No scleral icterus.  Neck: Normal range of motion. Neck supple. No JVD present. Carotid bruit is not present.  Cardiovascular: Normal rate, regular rhythm and normal heart sounds.  Pulmonary/Chest: Effort normal and breath sounds normal.  Abdominal: Normal appearance. There is no splenomegaly or hepatomegaly.  Musculoskeletal: Normal range of motion.  Neurological: She is alert and oriented to person, place, and time.  Skin: Skin is warm, dry and intact. No rash noted. No pallor.  Psychiatric: She has a normal mood and affect. Her behavior is normal. Judgment and thought content normal.    Results for orders placed or performed in visit on 02/26/17  Comprehensive metabolic panel  Result Value Ref Range   Glucose 86 65 - 99 mg/dL   BUN 8 6 - 20 mg/dL   Creatinine, Ser 0.63 0.57 - 1.00 mg/dL   GFR calc non Af Amer 115 >59 mL/min/1.73   GFR calc Af Amer 133 >59 mL/min/1.73   BUN/Creatinine Ratio 13 9 - 23   Sodium 139 134 - 144 mmol/L   Potassium 4.6 3.5 - 5.2 mmol/L   Chloride 105 96 - 106 mmol/L   CO2 20 20 - 29 mmol/L   Calcium 9.2 8.7 - 10.2 mg/dL   Total Protein 6.2 6.0 - 8.5 g/dL   Albumin 4.1 3.5 - 5.5 g/dL   Globulin, Total 2.1 1.5 - 4.5 g/dL   Albumin/Globulin Ratio 2.0 1.2 - 2.2   Bilirubin Total 0.2 0.0 - 1.2 mg/dL   Alkaline Phosphatase 104 39 - 117  IU/L   AST 14 0 - 40 IU/L   ALT 19 0 - 32 IU/L  Lipid Panel w/o Chol/HDL Ratio  Result Value Ref Range   Cholesterol, Total 205 (H) 100 - 199 mg/dL   Triglycerides 147 0 - 149 mg/dL   HDL 59 >39 mg/dL   VLDL Cholesterol Cal 29 5 - 40 mg/dL   LDL Calculated 117 (H) 0 - 99 mg/dL  TSH  Result Value Ref Range   TSH 2.130 0.450 - 4.500 uIU/mL  CBC with Differential/Platelet  Result Value Ref Range   WBC 9.2 3.4 - 10.8 x10E3/uL   RBC 4.76 3.77 - 5.28 x10E6/uL   Hemoglobin 14.0 11.1 - 15.9 g/dL   Hematocrit 42.6 34.0 - 46.6 %   MCV 90 79 - 97 fL   MCH 29.4 26.6 - 33.0 pg   MCHC 32.9 31.5 - 35.7 g/dL   RDW 13.5 12.3 - 15.4 %   Platelets 253 150 - 379 x10E3/uL   Neutrophils 69 Not Estab. %   Lymphs 24 Not Estab. %   Monocytes 6 Not Estab. %   Eos 1 Not Estab. %   Basos 0 Not Estab. %   Neutrophils Absolute 6.3 1.4 - 7.0 x10E3/uL   Lymphocytes Absolute 2.2 0.7 - 3.1 x10E3/uL   Monocytes Absolute 0.5 0.1 - 0.9 x10E3/uL   EOS (ABSOLUTE) 0.1 0.0 - 0.4 x10E3/uL   Basophils Absolute 0.0 0.0 - 0.2 x10E3/uL   Immature Granulocytes 0 Not Estab. %   Immature Grans (Abs) 0.0 0.0 - 0.1 x10E3/uL  IGP, Aptima HPV, rfx 16/18,45  Result Value Ref Range   DIAGNOSIS: Comment    Specimen adequacy: Comment    Clinician Provided ICD10 Comment    Performed by: Comment    PAP Smear Comment .    Note: Comment    Test Methodology Comment    HPV Aptima Negative Negative      Assessment & Plan:   Problem List Items Addressed This Visit      Unprioritized   Atypical nevi    Dermatology for a skin check      Depression, major, single episode, severe ([place])    Improved.  Add Cymbalta for this and fibromyalgia.  Start 30 mg daily for 2 weeks then 60 mg.        Relevant Medications   DULoxetine (  CYMBALTA) 30 MG capsule   Fibromyalgia    Gabapentin seems to be not as effective.  Will continue for now.  Consider Lyrica and add Cymbalta.  Pt is asking to increase Gabapentin to QID.  OK for now but  dose escalation I believe is unsustainable.        Plantar fasciitis    She has tried conservative care.  Refer to podiatry       Other Visit Diagnoses    Weight gain    -  Primary   Check with AM Cortisol.  Consider 24 hour urine dexa stim test.     Relevant Orders   T4   T3, free   Cortisol       Follow up plan: Return in about 1 month (around 09/01/2017).

## 2017-08-05 IMAGING — US US ABDOMEN LIMITED
1 series · 14 of 25 positions shown · non-contrast
Comparison: None.

CLINICAL DATA: Epigastric and central chest pain while eating.
Right upper quadrant tenderness for 3 hours.

EXAM:
ULTRASOUND ABDOMEN LIMITED RIGHT UPPER QUADRANT

[Series 1: us abdomen limited · 0.22mm/px · 14 of 39 slices shown]
[im 1/39]
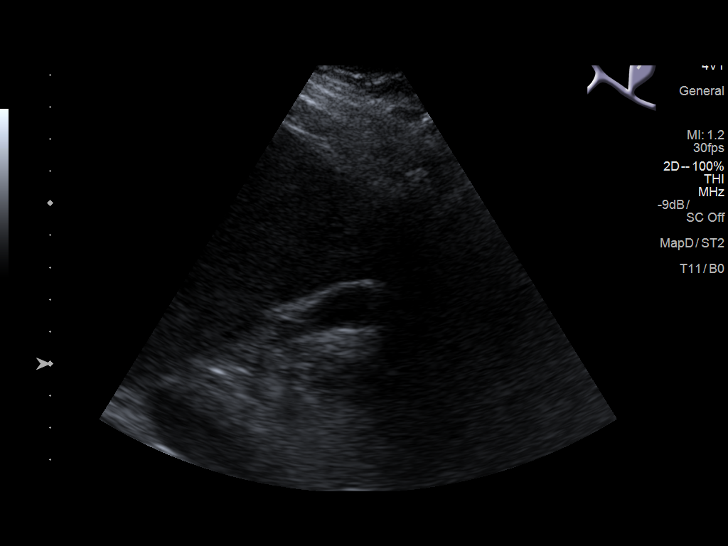
[im 4/39]
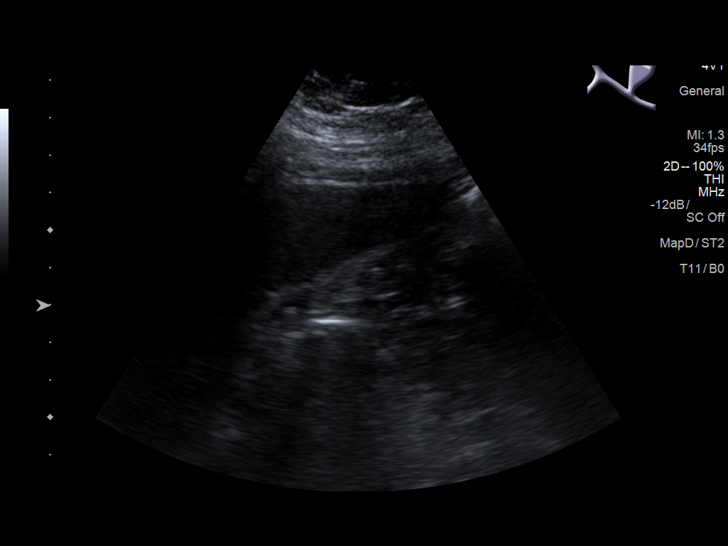
[im 7/39]
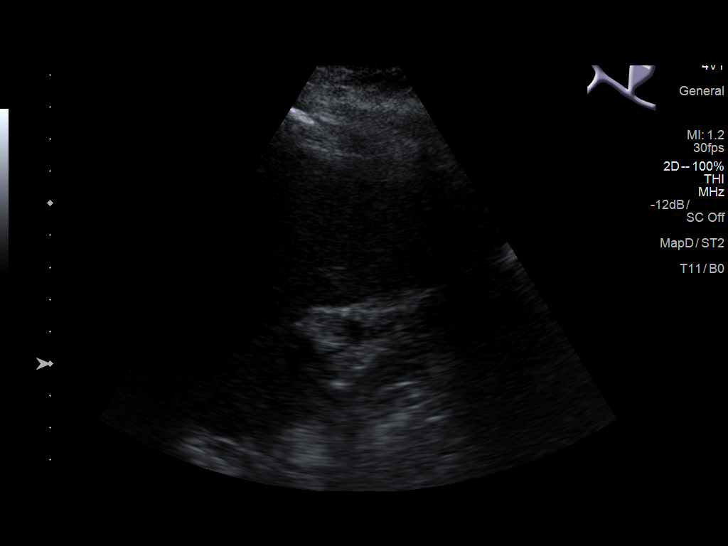
[im 10/39]
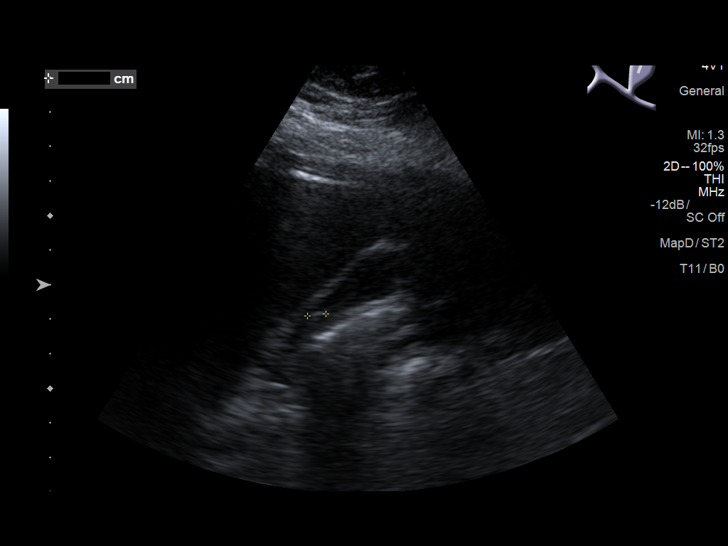
[im 13/39]
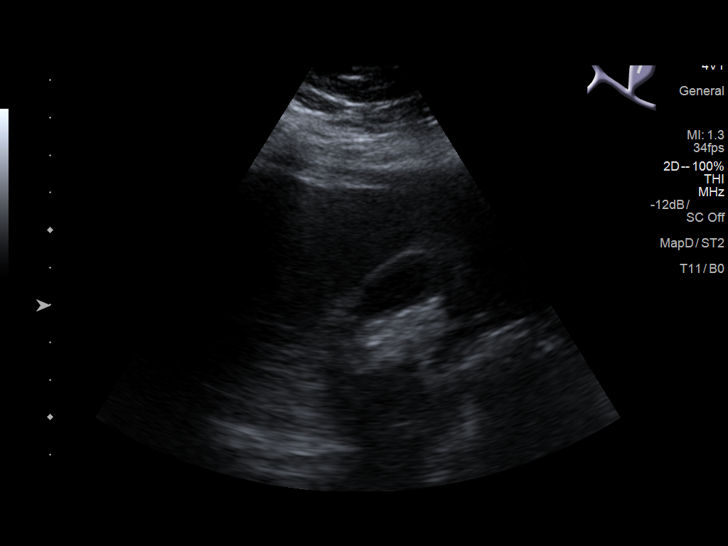
[im 15/39]
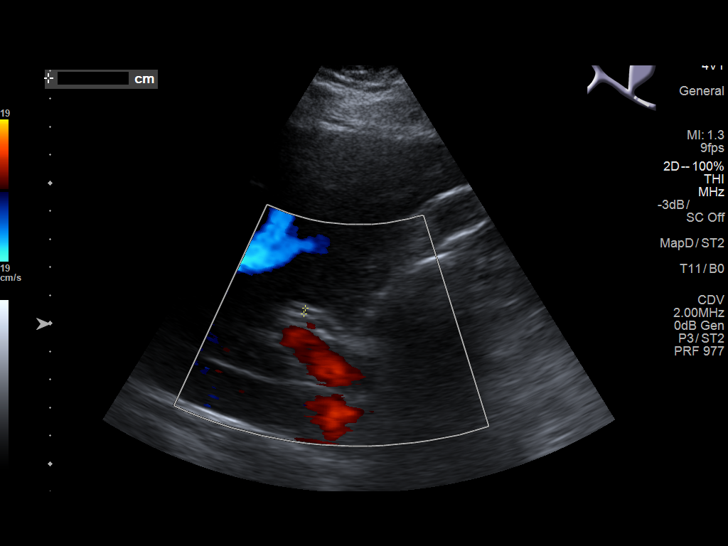
[im 18/39]
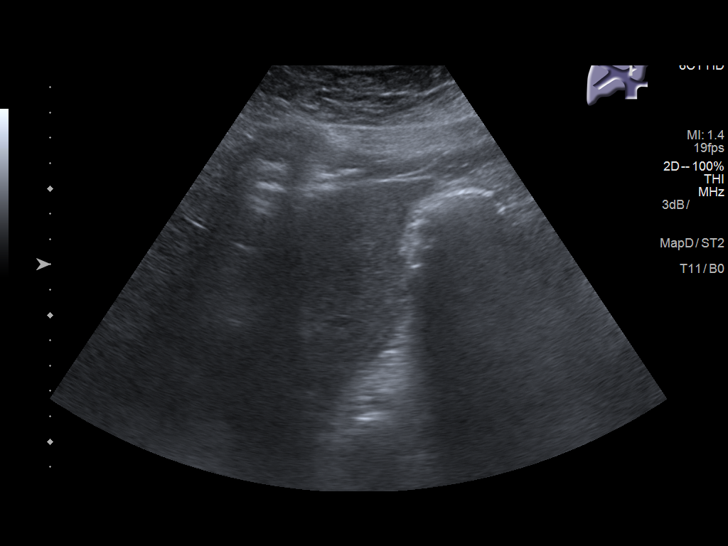
[im 21/39]
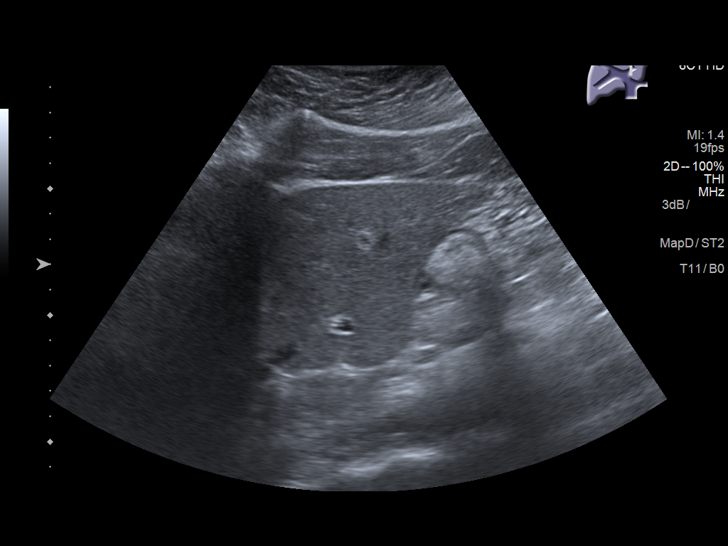
[im 24/39]
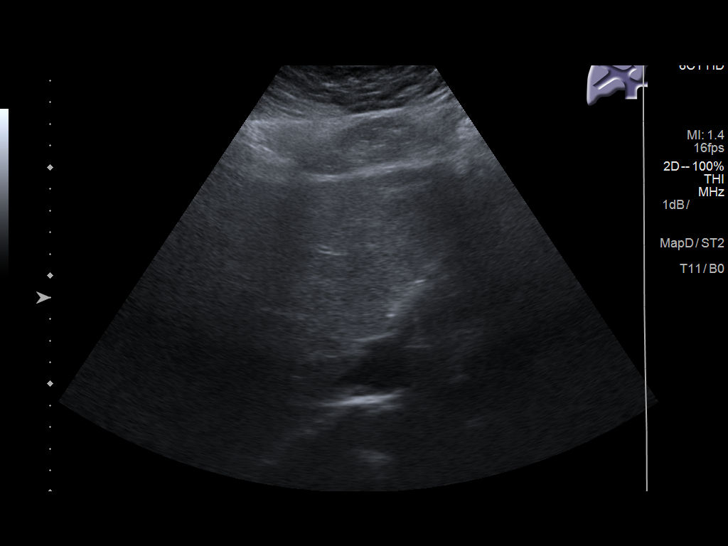
[im 26/39]
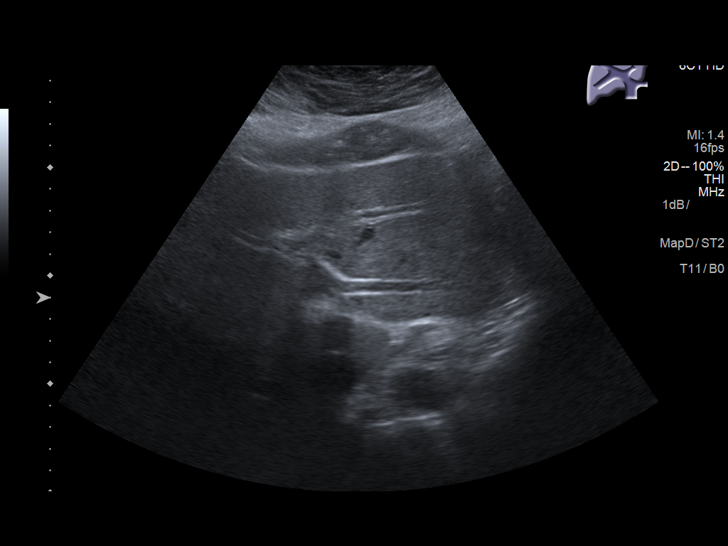
[im 29/39]
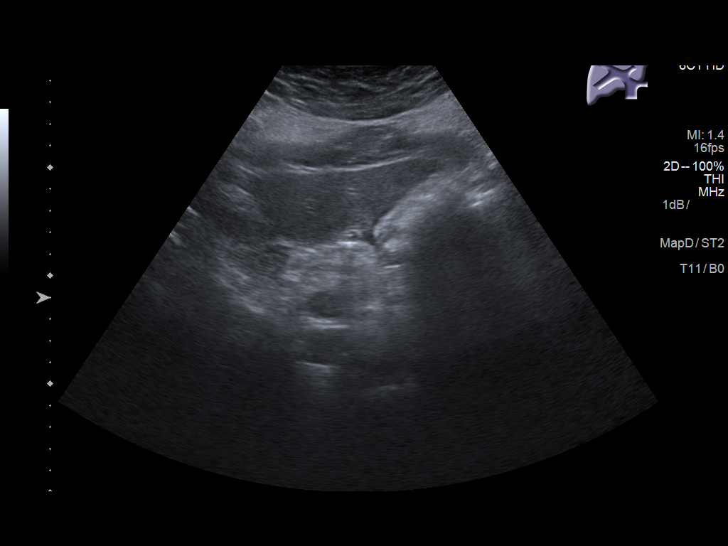
[im 32/39]
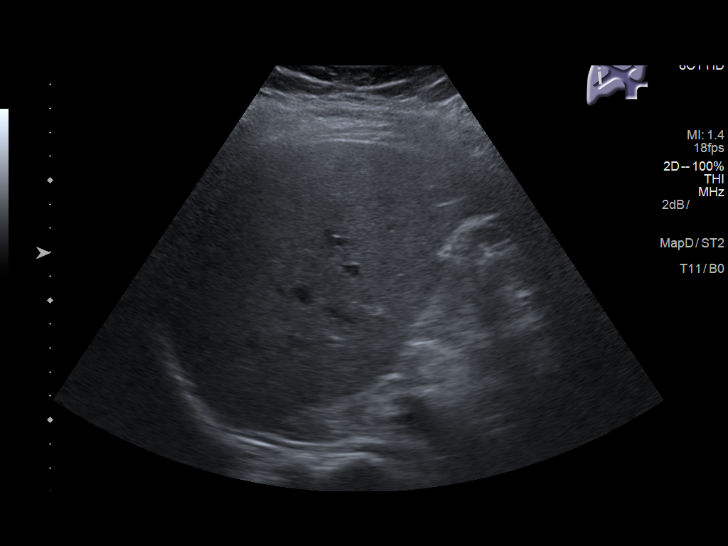
[im 35/39]
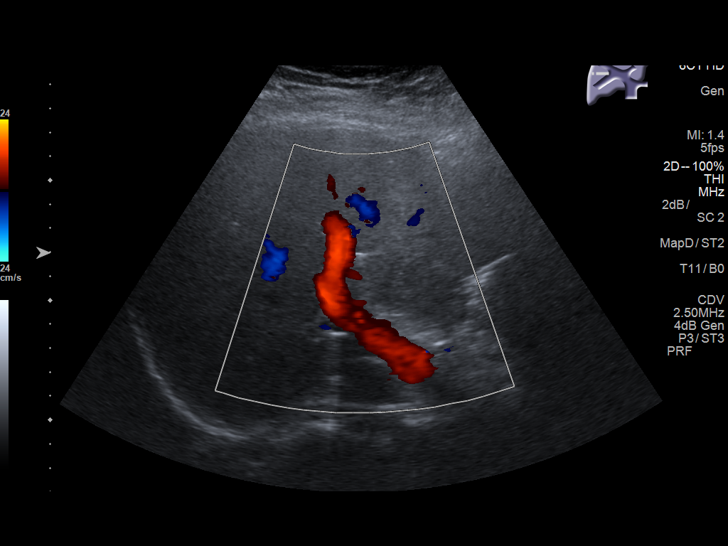
[im 39/39]
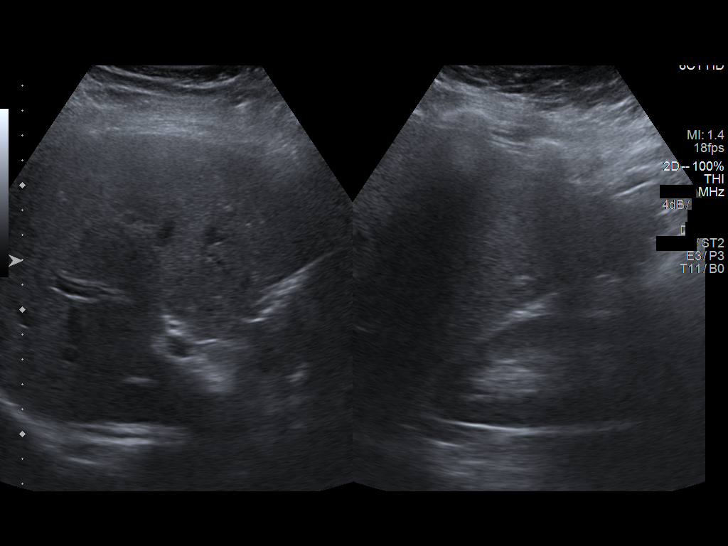

[14 of 25 positions shown; findings below may reference images not displayed]

FINDINGS: Gallbladder:

Contracted gallbladder with a questionable 5 mm stone. No wall
thickening. No sonographic Murphy sign noted by sonographer.

Common bile duct:

Diameter: 2 mm

Liver:

Mildly increased echogenicity diffusely without focal lesion
identified.
IMPRESSION: 1. Contracted gallbladder with questionable small stone. No evidence
of acute cholecystitis.
2. Slightly echogenic liver which may reflect steatosis.

## 2017-08-15 ENCOUNTER — Other Ambulatory Visit: Payer: BLUE CROSS/BLUE SHIELD

## 2017-08-15 DIAGNOSIS — R635 Abnormal weight gain: Secondary | ICD-10-CM

## 2017-08-16 LAB — T3, FREE: T3, Free: 3.4 pg/mL (ref 2.0–4.4)

## 2017-08-16 LAB — CORTISOL: Cortisol: 6.9 ug/dL

## 2017-08-16 LAB — T4: T4, Total: 8 ug/dL (ref 4.5–12.0)

## 2017-09-05 ENCOUNTER — Ambulatory Visit: Payer: BLUE CROSS/BLUE SHIELD | Admitting: Unknown Physician Specialty

## 2017-09-09 ENCOUNTER — Ambulatory Visit (INDEPENDENT_AMBULATORY_CARE_PROVIDER_SITE_OTHER): Payer: BLUE CROSS/BLUE SHIELD | Admitting: Unknown Physician Specialty

## 2017-09-09 ENCOUNTER — Encounter: Payer: Self-pay | Admitting: Unknown Physician Specialty

## 2017-09-09 DIAGNOSIS — M797 Fibromyalgia: Secondary | ICD-10-CM

## 2017-09-09 DIAGNOSIS — F322 Major depressive disorder, single episode, severe without psychotic features: Secondary | ICD-10-CM

## 2017-09-09 MED ORDER — GABAPENTIN 800 MG PO TABS: 800.0000 mg | ORAL_TABLET | Freq: Four times a day (QID) | ORAL | 0 refills | Status: DC

## 2017-09-09 NOTE — Assessment & Plan Note (Signed)
Much improved with Cymbalta 60 mg.

## 2017-09-09 NOTE — Assessment & Plan Note (Signed)
Pt still has pain, but more relaxed following Cymbalta.  OK to increase Gabapentin 800 mg from TID to QID.  Consider Lyrica and low dose Naltrexone./

## 2017-09-09 NOTE — Assessment & Plan Note (Signed)
Losing weight with change in diet.

## 2017-09-09 NOTE — Progress Notes (Signed)
BP 129/85   Pulse 76   Temp 98.2 F (36.8 C) (Oral)   Ht 5' 3.5" (1.613 m)   Wt 226 lb 6.4 oz (102.7 kg)   SpO2 98%   BMI 39.48 kg/m    Subjective:    Patient ID: Sydney Valenzuela, female    DOB: 08/24/1980, 37 y.o.   MRN: [id number]  HPI: Sydney Valenzuela is a 37 y.o. female  Chief Complaint  Patient presents with  . Depression  . Fibromyalgia   Depression Started Cymbalta last visit. Pt states she is not "as chaotic" Describes herself as more relaxed.  Recently quit her job and found another.  Was initially fatigued but now feels more "entuned"  Racing thoughts are improved.   Depression screen [facility] 2/9 09/09/2017 08/04/2017 02/26/2017 01/28/2017  Decreased Interest 1 1 2 1   Down, Depressed, Hopeless 1 1 2  0  PHQ - 2 Score 2 2 4 1   Altered sleeping 0 1 3 3   Tired, decreased energy 1 2 3 3   Change in appetite 0 0 2 1  Feeling bad or failure about yourself  1 0 2 0  Trouble concentrating 0 1 2 3   Moving slowly or fidgety/restless 0 1 2 1   Suicidal thoughts 0 0 0 0  PHQ-9 Score 4 7 18 12   Difficult doing work/chores Somewhat difficult - - -    Fibromyalgia States she still has a lot of pain. Feels she would benefit from going up on her current dose of Gabapentin.  States she has pain that travels around and comes and goes.    Weight gain Lost 5 pounds with making some diet changes.  Started cucumber, ginger, and lemon water.  Cymbalta has helped prevent snacking.    Relevant past medical, surgical, family and social history reviewed and updated as indicated. Interim medical history since our last visit reviewed. Allergies and medications reviewed and updated.  Review of Systems  Per HPI unless specifically indicated above     Objective:    BP 129/85   Pulse 76   Temp 98.2 F (36.8 C) (Oral)   Ht 5' 3.5" (1.613 m)   Wt 226 lb 6.4 oz (102.7 kg)   SpO2 98%   BMI 39.48 kg/m   Wt Readings from Last 3 Encounters:  09/09/17 226 lb 6.4 oz (102.7 kg)  08/04/17 231 lb  6.4 oz (105 kg)  03/17/17 223 lb 6.4 oz (101.3 kg)    Physical Exam  Constitutional: She is oriented to person, place, and time. She appears well-developed and well-nourished. No distress.  HENT:  Head: Normocephalic and atraumatic.  Eyes: Conjunctivae and lids are normal. Right eye exhibits no discharge. Left eye exhibits no discharge. No scleral icterus.  Neck: Normal range of motion. Neck supple. No JVD present. Carotid bruit is not present.  Cardiovascular: Normal rate, regular rhythm and normal heart sounds.  Pulmonary/Chest: Effort normal and breath sounds normal.  Abdominal: Normal appearance. There is no splenomegaly or hepatomegaly.  Musculoskeletal: Normal range of motion.  Neurological: She is alert and oriented to person, place, and time.  Skin: Skin is warm, dry and intact. No rash noted. No pallor.  Psychiatric: She has a normal mood and affect. Her behavior is normal. Judgment and thought content normal.    Results for orders placed or performed in visit on 08/15/17  Cortisol  Result Value Ref Range   Cortisol 6.9 ug/dL  T3, free  Result Value Ref Range   T3, Free 3.4  2.0 - 4.4 pg/mL  T4  Result Value Ref Range   T4, Total 8.0 4.5 - 12.0 ug/dL      Assessment & Plan:   Problem List Items Addressed This Visit      Unprioritized   Depression, major, single episode, severe ([place])    Much improved with Cymbalta 60 mg.        Fibromyalgia    Pt still has pain, but more relaxed following Cymbalta.  OK to increase Gabapentin 800 mg from TID to QID.  Consider Lyrica and low dose Naltrexone./        Obesity, Class III, BMI 40-49.9 (morbid obesity) ([place])    Losing weight with change in diet.            Follow up plan: Return in about 1 month (around 10/07/2017).

## 2017-10-10 ENCOUNTER — Encounter: Payer: Self-pay | Admitting: Unknown Physician Specialty

## 2017-10-10 ENCOUNTER — Ambulatory Visit (INDEPENDENT_AMBULATORY_CARE_PROVIDER_SITE_OTHER): Payer: BLUE CROSS/BLUE SHIELD | Admitting: Unknown Physician Specialty

## 2017-10-10 ENCOUNTER — Other Ambulatory Visit: Payer: Self-pay

## 2017-10-10 DIAGNOSIS — F322 Major depressive disorder, single episode, severe without psychotic features: Secondary | ICD-10-CM | POA: Diagnosis not present

## 2017-10-10 DIAGNOSIS — M797 Fibromyalgia: Secondary | ICD-10-CM | POA: Diagnosis not present

## 2017-10-10 MED ORDER — VENLAFAXINE HCL ER 75 MG PO CP24: 75.0000 mg | ORAL_CAPSULE | Freq: Every day | ORAL | 2 refills | Status: DC

## 2017-10-10 MED ORDER — GABAPENTIN 800 MG PO TABS: 800.0000 mg | ORAL_TABLET | Freq: Four times a day (QID) | ORAL | 2 refills | Status: DC

## 2017-10-10 NOTE — Progress Notes (Signed)
BP (!) 135/93   Pulse 74   Temp 98.2 F (36.8 C) (Tympanic)   Ht 5' 3.5" (1.613 m)   Wt 234 lb 8 oz (106.4 kg)   SpO2 96%   BMI 40.89 kg/m    Subjective:    Patient ID: Sydney Valenzuela, female    DOB: Apr 29, 1980, 37 y.o.   MRN: [id number]  HPI: Sydney Valenzuela is a 37 y.o. female  Chief Complaint  Patient presents with  . Follow-up    medication change request cymbalta  . Medication Refill    gabapentin   Fibromyalgia Pt with fbromyalgia currently treated with Duloxetine and Gabapentin.  Last visit increased Gabapentin.  Pt states she really likes the Duloxeine but found she started a lack of motivation and feels she needs to lower the dose.  Feels the increased dose of Gabapentin has helped.  Takes her 4th dose of Gabapentin on occasion.    Depression screen [facility] 2/9 10/10/2017 09/09/2017 08/04/2017 02/26/2017 01/28/2017  Decreased Interest 1 1 1 2 1   Down, Depressed, Hopeless 1 1 1 2  0  PHQ - 2 Score 2 2 2 4 1   Altered sleeping 3 0 1 3 3   Tired, decreased energy 3 1 2 3 3   Change in appetite 1 0 0 2 1  Feeling bad or failure about yourself  1 1 0 2 0  Trouble concentrating 0 0 1 2 3   Moving slowly or fidgety/restless 0 0 1 2 1   Suicidal thoughts 0 0 0 0 0  PHQ-9 Score 10 4 7 18 12   Difficult doing work/chores - Somewhat difficult - - -     Relevant past medical, surgical, family and social history reviewed and updated as indicated. Interim medical history since our last visit reviewed. Allergies and medications reviewed and updated.  Review of Systems  Per HPI unless specifically indicated above     Objective:    BP (!) 135/93   Pulse 74   Temp 98.2 F (36.8 C) (Tympanic)   Ht 5' 3.5" (1.613 m)   Wt 234 lb 8 oz (106.4 kg)   SpO2 96%   BMI 40.89 kg/m   Wt Readings from Last 3 Encounters:  10/10/17 234 lb 8 oz (106.4 kg)  09/09/17 226 lb 6.4 oz (102.7 kg)  08/04/17 231 lb 6.4 oz (105 kg)    Physical Exam  Constitutional: She is oriented to person, place,  and time. She appears well-developed and well-nourished. No distress.  HENT:  Head: Normocephalic and atraumatic.  Eyes: Conjunctivae and lids are normal. Right eye exhibits no discharge. Left eye exhibits no discharge. No scleral icterus.  Neck: Normal range of motion. Neck supple. No JVD present. Carotid bruit is not present.  Cardiovascular: Normal rate, regular rhythm and normal heart sounds.  Pulmonary/Chest: Effort normal and breath sounds normal.  Abdominal: Normal appearance. There is no splenomegaly or hepatomegaly.  Musculoskeletal: Normal range of motion.  Neurological: She is alert and oriented to person, place, and time.  Skin: Skin is warm, dry and intact. No rash noted. No pallor.  Psychiatric: She has a normal mood and affect. Her behavior is normal. Judgment and thought content normal.    Results for orders placed or performed in visit on 08/15/17  Cortisol  Result Value Ref Range   Cortisol 6.9 ug/dL  T3, free  Result Value Ref Range   T3, Free 3.4 2.0 - 4.4 pg/mL  T4  Result Value Ref Range   T4, Total 8.0  4.5 - 12.0 ug/dL      Assessment & Plan:   Problem List Items Addressed This Visit      Unprioritized   Depression, major, single episode, severe ([place])    Pt complaining of lack of motivation.  Will change to Effexor to see if motivation improves with this medication.       Relevant Medications   venlafaxine XR (EFFEXOR-XR) 75 MG 24 hr capsule   Fibromyalgia    Better with current therapy.  Will continue present Gabapentin          Follow up plan: Return in about 1 month (around 11/07/2017) for with Adrianna.

## 2017-10-10 NOTE — Assessment & Plan Note (Signed)
Pt complaining of lack of motivation.  Will change to Effexor to see if motivation improves with this medication.

## 2017-10-10 NOTE — Assessment & Plan Note (Signed)
Better with current therapy.  Will continue present Gabapentin

## 2017-10-15 ENCOUNTER — Ambulatory Visit: Payer: BLUE CROSS/BLUE SHIELD | Admitting: Unknown Physician Specialty

## 2017-11-10 ENCOUNTER — Ambulatory Visit: Payer: BLUE CROSS/BLUE SHIELD | Admitting: Physician Assistant

## 2017-12-17 ENCOUNTER — Ambulatory Visit: Payer: BLUE CROSS/BLUE SHIELD | Admitting: Family Medicine

## 2017-12-19 ENCOUNTER — Other Ambulatory Visit: Payer: Self-pay

## 2017-12-19 ENCOUNTER — Ambulatory Visit (INDEPENDENT_AMBULATORY_CARE_PROVIDER_SITE_OTHER): Payer: BLUE CROSS/BLUE SHIELD | Admitting: Family Medicine

## 2017-12-19 ENCOUNTER — Encounter: Payer: Self-pay | Admitting: Family Medicine

## 2017-12-19 VITALS — BP 132/90 | HR 73 | Temp 98.2°F | Ht 63.5 in | Wt 232.0 lb

## 2017-12-19 DIAGNOSIS — F419 Anxiety disorder, unspecified: Secondary | ICD-10-CM

## 2017-12-19 DIAGNOSIS — F322 Major depressive disorder, single episode, severe without psychotic features: Secondary | ICD-10-CM

## 2017-12-19 DIAGNOSIS — R202 Paresthesia of skin: Secondary | ICD-10-CM

## 2017-12-19 DIAGNOSIS — R350 Frequency of micturition: Secondary | ICD-10-CM | POA: Diagnosis not present

## 2017-12-19 DIAGNOSIS — M797 Fibromyalgia: Secondary | ICD-10-CM

## 2017-12-19 DIAGNOSIS — G43909 Migraine, unspecified, not intractable, without status migrainosus: Secondary | ICD-10-CM

## 2017-12-19 MED ORDER — VENLAFAXINE HCL ER 150 MG PO CP24: 150.0000 mg | ORAL_CAPSULE | Freq: Every day | ORAL | 2 refills | Status: DC

## 2017-12-19 MED ORDER — HYDROXYZINE HCL 25 MG PO TABS: 25.0000 mg | ORAL_TABLET | Freq: Three times a day (TID) | ORAL | 1 refills | Status: DC | PRN

## 2017-12-19 MED ORDER — SUMATRIPTAN SUCCINATE 50 MG PO TABS: 50.0000 mg | ORAL_TABLET | ORAL | 1 refills | Status: DC | PRN

## 2017-12-19 NOTE — Progress Notes (Signed)
BP 132/90   Pulse 73   Temp 98.2 F (36.8 C) (Oral)   Ht 5' 3.5" (1.613 m)   Wt 232 lb (105.2 kg)   SpO2 96%   BMI 40.45 kg/m    Subjective:    Patient ID: Sydney Valenzuela, female    DOB: [birth year], 37 y.o.   MRN: [id number]  HPI: Sydney Valenzuela is a 37 y.o. female  Chief Complaint  Patient presents with  . Depression    pt would like to discuss about Effexor   Here today for mood f/u.  Effexor has been helping her more calm and less easily agitated, but still feels very unfocused., fatigued, and anxious. Has tried self help strategies she's learned in past counseling without relief. Does have some new work stress and life stress and reports feeling very overwhelmed. Sleeps very poorly, taking 2 benadryl nightly to sleep. Denies SI/HI. Bad side effects with buspar and wellbutrin in the past.   Progressive sxs x 3 years. Hand numbness, feet are now numb, falling, dropping things and knocking things over, poor swallowing coordination at times. Also has pain from the neck down, both joints and muscles which has been thought to be from Fibromyalgia. On high dose gabapentin and states without that the pain is much worse and she has no focus.   Hx of migraines, was seen at [facility] as a child. Migraines stopped about 17 years ago. Slowly seeing them come back over the last 4 years. Not currently taking anything besides high doses of tylenol for those.   Depression screen [facility] 2/9 12/19/2017 10/10/2017 09/09/2017  Decreased Interest 1 1 1   Down, Depressed, Hopeless 1 1 1   PHQ - 2 Score 2 2 2   Altered sleeping 1 3 0  Tired, decreased energy 1 3 1   Change in appetite 1 1 0  Feeling bad or failure about yourself  2 1 1   Trouble concentrating 2 0 0  Moving slowly or fidgety/restless 1 0 0  Suicidal thoughts 0 0 0  PHQ-9 Score 10 10 4   Difficult doing work/chores - - Somewhat difficult    Relevant past medical, surgical, family and social history reviewed and updated as indicated. Interim  medical history since our last visit reviewed. Allergies and medications reviewed and updated.  Review of Systems  Per HPI unless specifically indicated above     Objective:    BP 132/90   Pulse 73   Temp 98.2 F (36.8 C) (Oral)   Ht 5' 3.5" (1.613 m)   Wt 232 lb (105.2 kg)   SpO2 96%   BMI 40.45 kg/m   Wt Readings from Last 3 Encounters:  12/19/17 232 lb (105.2 kg)  10/10/17 234 lb 8 oz (106.4 kg)  09/09/17 226 lb 6.4 oz (102.7 kg)    Physical Exam  Constitutional: She is oriented to person, place, and time. She appears well-developed and well-nourished. No distress.  HENT:  Head: Atraumatic.  Eyes: Conjunctivae and EOM are normal.  Neck: Normal range of motion. Neck supple.  Cardiovascular: Normal rate, regular rhythm and intact distal pulses.  Pulmonary/Chest: Effort normal and breath sounds normal.  Musculoskeletal: Normal range of motion. She exhibits no edema.  Strength full and equal in all 4 extremities  Neurological: She is alert and oriented to person, place, and time. A sensory deficit (reports light touch feels differently between extremities, sometimes heavier sensation than others) is present. Coordination normal.  Psychiatric: She has a normal mood and affect. Her behavior  is normal.  Nursing note reviewed.   Results for orders placed or performed in visit on 08/15/17  Cortisol  Result Value Ref Range   Cortisol 6.9 ug/dL  T3, free  Result Value Ref Range   T3, Free 3.4 2.0 - 4.4 pg/mL  T4  Result Value Ref Range   T4, Total 8.0 4.5 - 12.0 ug/dL      Assessment & Plan:   Problem List Items Addressed This Visit      Cardiovascular and Mediastinum   Migraine    Will start on prn imiitrex and await Neurology evaluation. May need topamax or another prophylactic medication given severity of recurrences      Relevant Medications   venlafaxine XR (EFFEXOR XR) 150 MG 24 hr capsule   SUMAtriptan (IMITREX) 50 MG tablet     Other   Depression,  major, single episode, severe ([facility])    Improved on effexor, increase to 150 mg and continue to monitor closely      Relevant Medications   venlafaxine XR (EFFEXOR XR) 150 MG 24 hr capsule   hydrOXYzine (ATARAX/VISTARIL) 25 MG tablet   Anxiety    Will add prn hydroxyzine for anxiety episodes. Does not want to go back to counseling      Relevant Medications   venlafaxine XR (EFFEXOR XR) 150 MG 24 hr capsule   hydrOXYzine (ATARAX/VISTARIL) 25 MG tablet   Fibromyalgia    Notes improvement with high dose gabapentin, continue current regimen for now       Other Visit Diagnoses    Paresthesias    -  Primary   Requesting Neuro referral for workup of her progressive sxs. Referral placed. Will continue on gabapentin for now and work on migraine control   Relevant Orders   Ambulatory referral to Neurology   Urinary frequency       Relevant Orders   UA/M w/rflx Culture, Routine       Follow up plan: Return in about 8 weeks (around 02/13/2018) for Mood, pain f/u.

## 2017-12-22 DIAGNOSIS — G43909 Migraine, unspecified, not intractable, without status migrainosus: Secondary | ICD-10-CM | POA: Insufficient documentation

## 2017-12-22 NOTE — Assessment & Plan Note (Signed)
Notes improvement with high dose gabapentin, continue current regimen for now

## 2017-12-22 NOTE — Patient Instructions (Signed)
Follow up in 2 months

## 2017-12-22 NOTE — Assessment & Plan Note (Signed)
Improved on effexor, increase to 150 mg and continue to monitor closely

## 2017-12-22 NOTE — Assessment & Plan Note (Signed)
Will start on prn imiitrex and await Neurology evaluation. May need topamax or another prophylactic medication given severity of recurrences

## 2017-12-22 NOTE — Assessment & Plan Note (Signed)
Will add prn hydroxyzine for anxiety episodes. Does not want to go back to counseling

## 2017-12-30 ENCOUNTER — Telehealth: Payer: Self-pay | Admitting: Family Medicine

## 2017-12-30 MED ORDER — GABAPENTIN 800 MG PO TABS: 800.0000 mg | ORAL_TABLET | Freq: Three times a day (TID) | ORAL | 2 refills | Status: DC

## 2017-12-30 NOTE — Telephone Encounter (Signed)
Called and left patient a VM asking for her to please return my call. OK for the [facility] to speak to patient and let her know what Apolonio Schneiders said about medication. CRM created.

## 2017-12-30 NOTE — Telephone Encounter (Signed)
Pt came in to inquire about refill on gabapentin 800 mg states that she has went to the pharmacy and they will fax info but she doesn't want to wait 24 hrs due to being out.

## 2017-12-30 NOTE — Telephone Encounter (Signed)
Should not be out for another week and a half. Make sure she's not over-using it as that can be very dangerous. I've also decreased her medicine to TID as that is the true max dose and what I am comfortable with. Will send in a refill

## 2017-12-30 NOTE — Addendum Note (Signed)
Addended by: Merrie Roof E on: 12/30/2017 12:00 PM   Modules accepted: Orders

## 2017-12-31 NOTE — Telephone Encounter (Signed)
Sydney Valenzuela returned Sydney Valenzuela's call. She wanted to convey that she had been on Gabapentin 4x/day since June and does extremely well with her daily life due to that. She has a Neurologist appointment one week from today. Patient stated she would like to communicate with Sydney Valenzuela. Recommended she send email via [place].

## 2017-12-31 NOTE — Telephone Encounter (Signed)
Routing to Rachel 

## 2017-12-31 NOTE — Telephone Encounter (Signed)
Not comfortable with increasing back to 4/day as that exceeds maximum dose. I am happy to send her to pain management if she needs something stronger but that is not appropriate use of the medication

## 2017-12-31 NOTE — Telephone Encounter (Signed)
Called and left patient a VM asking for her to please return my call. CRM updated, ok for [facility] to let patient know Rachel's message regarding medication.

## 2017-12-31 NOTE — Telephone Encounter (Signed)
Called and spoke to patient. I let patient know what Apolonio Schneiders said about the medication. Patient states that she does not overuse the medication. She states that she has a very weird work schedule where she may have to work 16 hours straight and then be up for school work as well. Patient states that she has been up for 34 hours straight right now and this is why Malachy Mood had her on the 4 times per day. Patient asked if this could be changed by to 4 per day, stating that it does make a big difference in her day.

## 2018-01-01 NOTE — Telephone Encounter (Signed)
Called and let patient know Rachel's message. Patient states that she is going to speak to her Neurologist about this and that it was not right of Apolonio Schneiders to decrease her medication without even speaking to her. She states that she is going to be sending Apolonio Schneiders and email and that she will not be returning to she Apolonio Schneiders for her care. Will route back to [place] as an [place] and route to [place] as well.

## 2018-01-01 NOTE — Telephone Encounter (Signed)
My answer remains the same, she can speak with Neurology at her upcoming appt but until then I'm not comfortable with that dosing.

## 2018-01-13 DIAGNOSIS — R5383 Other fatigue: Secondary | ICD-10-CM | POA: Diagnosis not present

## 2018-01-13 DIAGNOSIS — R531 Weakness: Secondary | ICD-10-CM | POA: Diagnosis not present

## 2018-01-13 DIAGNOSIS — R202 Paresthesia of skin: Secondary | ICD-10-CM | POA: Diagnosis not present

## 2018-01-13 DIAGNOSIS — E559 Vitamin D deficiency, unspecified: Secondary | ICD-10-CM | POA: Diagnosis not present

## 2018-01-13 DIAGNOSIS — R200 Anesthesia of skin: Secondary | ICD-10-CM | POA: Diagnosis not present

## 2018-01-13 DIAGNOSIS — R292 Abnormal reflex: Secondary | ICD-10-CM | POA: Diagnosis not present

## 2018-01-15 DIAGNOSIS — R200 Anesthesia of skin: Secondary | ICD-10-CM | POA: Insufficient documentation

## 2018-01-15 DIAGNOSIS — R202 Paresthesia of skin: Principal | ICD-10-CM | POA: Insufficient documentation

## 2018-01-15 DIAGNOSIS — R531 Weakness: Secondary | ICD-10-CM | POA: Insufficient documentation

## 2018-01-15 DIAGNOSIS — R292 Abnormal reflex: Secondary | ICD-10-CM | POA: Insufficient documentation

## 2018-01-19 DIAGNOSIS — G44209 Tension-type headache, unspecified, not intractable: Secondary | ICD-10-CM | POA: Diagnosis not present

## 2018-01-19 DIAGNOSIS — R200 Anesthesia of skin: Secondary | ICD-10-CM | POA: Diagnosis not present

## 2018-01-30 DIAGNOSIS — G35 Multiple sclerosis: Secondary | ICD-10-CM | POA: Diagnosis not present

## 2018-02-06 ENCOUNTER — Ambulatory Visit: Payer: BLUE CROSS/BLUE SHIELD | Admitting: Family Medicine

## 2018-02-06 DIAGNOSIS — M50222 Other cervical disc displacement at C5-C6 level: Secondary | ICD-10-CM | POA: Diagnosis not present

## 2018-02-10 ENCOUNTER — Encounter: Payer: PRIVATE HEALTH INSURANCE | Admitting: Obstetrics & Gynecology

## 2018-02-10 DIAGNOSIS — R202 Paresthesia of skin: Secondary | ICD-10-CM | POA: Diagnosis not present

## 2018-02-10 DIAGNOSIS — R5383 Other fatigue: Secondary | ICD-10-CM | POA: Diagnosis not present

## 2018-02-10 DIAGNOSIS — R200 Anesthesia of skin: Secondary | ICD-10-CM | POA: Diagnosis not present

## 2018-02-10 DIAGNOSIS — R531 Weakness: Secondary | ICD-10-CM | POA: Diagnosis not present

## 2018-02-25 DIAGNOSIS — R200 Anesthesia of skin: Secondary | ICD-10-CM | POA: Diagnosis not present

## 2018-02-25 DIAGNOSIS — R202 Paresthesia of skin: Secondary | ICD-10-CM | POA: Diagnosis not present

## 2018-03-10 ENCOUNTER — Encounter: Payer: PRIVATE HEALTH INSURANCE | Admitting: Obstetrics & Gynecology

## 2018-03-11 ENCOUNTER — Ambulatory Visit (INDEPENDENT_AMBULATORY_CARE_PROVIDER_SITE_OTHER): Payer: BLUE CROSS/BLUE SHIELD | Admitting: Family Medicine

## 2018-03-11 ENCOUNTER — Encounter: Payer: Self-pay | Admitting: Family Medicine

## 2018-03-11 VITALS — BP 137/90 | HR 101 | Temp 98.6°F | Wt 228.0 lb

## 2018-03-11 DIAGNOSIS — R202 Paresthesia of skin: Secondary | ICD-10-CM

## 2018-03-11 DIAGNOSIS — Z23 Encounter for immunization: Secondary | ICD-10-CM

## 2018-03-11 DIAGNOSIS — R200 Anesthesia of skin: Secondary | ICD-10-CM | POA: Diagnosis not present

## 2018-03-11 DIAGNOSIS — M797 Fibromyalgia: Secondary | ICD-10-CM | POA: Diagnosis not present

## 2018-03-11 DIAGNOSIS — F419 Anxiety disorder, unspecified: Secondary | ICD-10-CM | POA: Diagnosis not present

## 2018-03-11 MED ORDER — VENLAFAXINE HCL ER 75 MG PO CP24: 75.0000 mg | ORAL_CAPSULE | Freq: Every day | ORAL | 1 refills | Status: DC

## 2018-03-11 MED ORDER — PANTOPRAZOLE SODIUM 40 MG PO TBEC: 40.0000 mg | DELAYED_RELEASE_TABLET | Freq: Two times a day (BID) | ORAL | 3 refills | Status: DC

## 2018-03-11 MED ORDER — DICLOFENAC SODIUM 75 MG PO TBEC: 75.0000 mg | DELAYED_RELEASE_TABLET | Freq: Two times a day (BID) | ORAL | 2 refills | Status: DC

## 2018-03-11 MED ORDER — METHOCARBAMOL 500 MG PO TABS: 500.0000 mg | ORAL_TABLET | Freq: Three times a day (TID) | ORAL | 1 refills | Status: DC | PRN

## 2018-03-11 NOTE — Assessment & Plan Note (Signed)
Reduce effexor to 75 mg XR. Has been doing well on that dose.

## 2018-03-11 NOTE — Patient Instructions (Signed)

## 2018-03-11 NOTE — Progress Notes (Signed)
BP 137/90   Pulse (!) 101   Temp 98.6 F (37 C) (Oral)   Wt 228 lb (103.4 kg)   SpO2 98%   BMI 39.75 kg/m    Subjective:    Patient ID: Sydney Valenzuela, female    DOB: 12-Feb-1981, 37 y.o.   MRN: [id number]  HPI: Sydney Valenzuela is a 37 y.o. female  Chief Complaint  Patient presents with  . Medication Management    Effexor feels it's too high reduced her, been taking 1/2. Gabapentin stated she need 4 tablet daily.   Marland Kitchen Referral    Wants second opinion from another neurologist   Here today for mood and numbness/tingling f/u. Effexor 150 mg too strong, sedated her too much so cut back down to the 75 mg dose which helps with her anxiety quite a bit.   Still wanting to increase her gabapentin back to 3200 mg daily. Currently on 2400 and having days where she can't get out of bed from her fibromyalgia pain. Still having weakness, numbness and tingling, and fatigue. Was worked up by Neurology with negative brain MRI, EMG, and labs. Started on diclofenac BID which has not helped much. Wanting second opinion.   Depression screen [facility] 2/9 12/19/2017 10/10/2017 09/09/2017  Decreased Interest 1 1 1   Down, Depressed, Hopeless 1 1 1   PHQ - 2 Score 2 2 2   Altered sleeping 1 3 0  Tired, decreased energy 1 3 1   Change in appetite 1 1 0  Feeling bad or failure about yourself  2 1 1   Trouble concentrating 2 0 0  Moving slowly or fidgety/restless 1 0 0  Suicidal thoughts 0 0 0  PHQ-9 Score 10 10 4   Difficult doing work/chores - - Somewhat difficult  No flowsheet data found.    Relevant past medical, surgical, family and social history reviewed and updated as indicated. Interim medical history since our last visit reviewed. Allergies and medications reviewed and updated.  Review of Systems  Per HPI unless specifically indicated above     Objective:    BP 137/90   Pulse (!) 101   Temp 98.6 F (37 C) (Oral)   Wt 228 lb (103.4 kg)   SpO2 98%   BMI 39.75 kg/m   Wt Readings from Last 3  Encounters:  03/11/18 228 lb (103.4 kg)  12/19/17 232 lb (105.2 kg)  10/10/17 234 lb 8 oz (106.4 kg)    Physical Exam  Constitutional: She is oriented to person, place, and time. She appears well-developed and well-nourished. No distress.  HENT:  Head: Atraumatic.  Eyes: Pupils are equal, round, and reactive to light. Conjunctivae and EOM are normal.  Neck: Normal range of motion. Neck supple.  Cardiovascular: Normal rate and regular rhythm.  Pulmonary/Chest: Effort normal and breath sounds normal.  Musculoskeletal: Normal range of motion.  Neurological: She is alert and oriented to person, place, and time.  Skin: Skin is warm and dry.  Psychiatric: She has a normal mood and affect. Her behavior is normal.  Nursing note and vitals reviewed.   Results for orders placed or performed in visit on 08/15/17  Cortisol  Result Value Ref Range   Cortisol 6.9 ug/dL  T3, free  Result Value Ref Range   T3, Free 3.4 2.0 - 4.4 pg/mL  T4  Result Value Ref Range   T4, Total 8.0 4.5 - 12.0 ug/dL      Assessment & Plan:   Problem List Items Addressed This Visit  Other   Anxiety    Reduce effexor to 75 mg XR. Has been doing well on that dose.       Relevant Medications   venlafaxine XR (EFFEXOR XR) 75 MG 24 hr capsule   Fibromyalgia    Continue gabapentin at 2400 mg, discussed with patient that I am not comfortable prescribing a higher dosage. Continue diclofenac BID, start robaxin prn. Sedation precautions reviewed.       Numbness and tingling - Primary    Referral to Neurology for second opinion placed. Negative tests so far. Continue current regimen      Relevant Orders   Ambulatory referral to Neurology    Other Visit Diagnoses    Needs flu shot       Relevant Orders   Flu Vaccine QUAD 6+ mos PF IM (Fluarix Quad PF) (Completed)       Follow up plan: Return in about 6 months (around 09/10/2018) for 6 month f/u.

## 2018-03-11 NOTE — Assessment & Plan Note (Signed)
Referral to Neurology for second opinion placed. Negative tests so far. Continue current regimen

## 2018-03-11 NOTE — Assessment & Plan Note (Signed)
Continue gabapentin at 2400 mg, discussed with patient that I am not comfortable prescribing a higher dosage. Continue diclofenac BID, start robaxin prn. Sedation precautions reviewed.

## 2018-03-12 ENCOUNTER — Other Ambulatory Visit: Payer: Self-pay | Admitting: Family Medicine

## 2018-03-13 NOTE — Telephone Encounter (Signed)
Requested Prescriptions  Pending Prescriptions Disp Refills  . gabapentin (NEURONTIN) 800 MG tablet [Pharmacy Med Name: GABAPENTIN 800MG     TAB] 270 tablet 1    Sig: TAKE 1 TABLET BY MOUTH THREE TIMES DAILY     Neurology: Anticonvulsants - gabapentin Passed - 03/12/2018 11:11 AM      Passed - Valid encounter within last 12 months    Recent Outpatient Visits          2 days ago Numbness and tingling   [facility] Merrie Roof [place], [place]   2 months ago [place], [place]   5 months ago [place], NP   6 months ago [place], NP   7 months ago Weight gain   [facility] Kathrine Haddock, NP      Future Appointments            In 6 months Orene Desanctis, Lilia Argue, [place], [place]

## 2018-03-27 ENCOUNTER — Encounter: Payer: Self-pay | Admitting: Family Medicine

## 2018-03-27 ENCOUNTER — Ambulatory Visit (INDEPENDENT_AMBULATORY_CARE_PROVIDER_SITE_OTHER): Payer: BLUE CROSS/BLUE SHIELD | Admitting: Family Medicine

## 2018-03-27 VITALS — BP 134/83 | HR 103 | Temp 99.1°F | Wt 230.0 lb

## 2018-03-27 DIAGNOSIS — J111 Influenza due to unidentified influenza virus with other respiratory manifestations: Secondary | ICD-10-CM | POA: Diagnosis not present

## 2018-03-27 MED ORDER — OSELTAMIVIR PHOSPHATE 75 MG PO CAPS: 75.0000 mg | ORAL_CAPSULE | Freq: Two times a day (BID) | ORAL | 0 refills | Status: DC

## 2018-03-27 MED ORDER — PROMETHAZINE-DM 6.25-15 MG/5ML PO SYRP: 5.0000 mL | ORAL_SOLUTION | Freq: Four times a day (QID) | ORAL | 0 refills | Status: DC | PRN

## 2018-03-27 MED ORDER — BENZONATATE 200 MG PO CAPS: 200.0000 mg | ORAL_CAPSULE | Freq: Three times a day (TID) | ORAL | 0 refills | Status: DC | PRN

## 2018-03-27 NOTE — Progress Notes (Signed)
BP 134/83   Pulse (!) 103   Temp 99.1 F (37.3 C) (Oral)   Wt 230 lb (104.3 kg)   SpO2 98%   BMI 40.10 kg/m    Subjective:    Patient ID: Sydney Valenzuela, female    DOB: 1981/03/27, 37 y.o.   MRN: [id number]  HPI: Sydney Valenzuela is a 37 y.o. female  Chief Complaint  Patient presents with  . URI    pt states she has had cough, and congestion that started Monday. States body aches and chills started yesterday. Husband was diagnosed with flu A Monday    Patient c/o several weeks of mild cold sxs, but then yesterday started with significant body aches, low grade fevers, ear pain, sinus pressure, sweats, fatigue. Taking dayquil, nyquil, and tylenol with no relief. Husband was diagnosed with influenza a 4 days ago. Denies CP, SOB, N/V/D.   Past Medical History:  Diagnosis Date  . Allergy   . Anxiety   . Arthritis   . Family history of adverse reaction to anesthesia    N/V, difficult to wake up  . Hiatal hernia   . Hypertension    Social History   Socioeconomic History  . Marital status: Married    Spouse name: Not on file  . Number of children: Not on file  . Years of education: Not on file  . Highest education level: Not on file  Occupational History  . Not on file  Social Needs  . Financial resource strain: Not on file  . Food insecurity:    Worry: Not on file    Inability: Not on file  . Transportation needs:    Medical: Not on file    Non-medical: Not on file  Tobacco Use  . Smoking status: Current Every Day Smoker    Packs/day: 0.50  . Smokeless tobacco: Never Used  Substance and Sexual Activity  . Alcohol use: Yes    Comment: occasional  . Drug use: No  . Sexual activity: Yes    Birth control/protection: I.U.D.  Lifestyle  . Physical activity:    Days per week: Not on file    Minutes per session: Not on file  . Stress: Not on file  Relationships  . Social connections:    Talks on phone: Not on file    Gets together: Not on file    Attends  religious service: Not on file    Active member of club or organization: Not on file    Attends meetings of clubs or organizations: Not on file    Relationship status: Not on file  . Intimate partner violence:    Fear of current or ex partner: Not on file    Emotionally abused: Not on file    Physically abused: Not on file    Forced sexual activity: Not on file  Other Topics Concern  . Not on file  Social History Narrative  . Not on file    Relevant past medical, surgical, family and social history reviewed and updated as indicated. Interim medical history since our last visit reviewed. Allergies and medications reviewed and updated.  Review of Systems  Per HPI unless specifically indicated above     Objective:    BP 134/83   Pulse (!) 103   Temp 99.1 F (37.3 C) (Oral)   Wt 230 lb (104.3 kg)   SpO2 98%   BMI 40.10 kg/m   Wt Readings from Last 3 Encounters:  03/27/18 230 lb (104.3  kg)  03/11/18 228 lb (103.4 kg)  12/19/17 232 lb (105.2 kg)    Physical Exam Vitals signs and nursing note reviewed.  Constitutional:      Appearance: Normal appearance.  HENT:     Head: Atraumatic.     Right Ear: Tympanic membrane and external ear normal.     Left Ear: Tympanic membrane and external ear normal.     Nose: Rhinorrhea present.     Mouth/Throat:     Mouth: Mucous membranes are moist.     Pharynx: Posterior oropharyngeal erythema present.  Eyes:     Extraocular Movements: Extraocular movements intact.     Conjunctiva/sclera: Conjunctivae normal.  Neck:     Musculoskeletal: Normal range of motion and neck supple.  Cardiovascular:     Rate and Rhythm: Normal rate and regular rhythm.     Heart sounds: Normal heart sounds.  Pulmonary:     Effort: Pulmonary effort is normal.     Breath sounds: Normal breath sounds. No wheezing.  Musculoskeletal: Normal range of motion.  Skin:    General: Skin is warm and dry.  Neurological:     Mental Status: She is alert and oriented  to person, place, and time.  Psychiatric:        Mood and Affect: Mood normal.        Thought Content: Thought content normal.     Results for orders placed or performed in visit on 08/15/17  Cortisol  Result Value Ref Range   Cortisol 6.9 ug/dL  T3, free  Result Value Ref Range   T3, Free 3.4 2.0 - 4.4 pg/mL  T4  Result Value Ref Range   T4, Total 8.0 4.5 - 12.0 ug/dL      Assessment & Plan:   Problem List Items Addressed This Visit    None    Visit Diagnoses    Influenza    -  Primary   Sxs and exposures make this a likely dx. Tx with tamiflu, tessalon, promethazine-DM, supportive care. F/u if not improving   Relevant Medications   oseltamivir (TAMIFLU) 75 MG capsule       Follow up plan: Return if symptoms worsen or fail to improve.

## 2018-04-02 ENCOUNTER — Ambulatory Visit: Payer: Self-pay

## 2018-04-02 MED ORDER — AZITHROMYCIN 250 MG PO TABS: ORAL_TABLET | ORAL | 0 refills | Status: DC

## 2018-04-02 NOTE — Telephone Encounter (Signed)
Call placed to patient. Left VM to call office

## 2018-04-02 NOTE — Telephone Encounter (Signed)
Rx sent for zpak, needs follow up if not improving

## 2018-04-02 NOTE — Telephone Encounter (Signed)
The pt states that her flu symptoms have gotten better; however she has congestion in her chest and nose (better since she did nasal wash); the pt says that she a productive cough with greenish tint; the pt uses [facility] [address]; she would like to have an antibiotic called in per her previous conversation with Apolonio Schneiders;  recommendations made per nurse triage protocol; will route to office for final disposition; the pt can be contacted at [phone].    Reason for Disposition . [1] HIGH RISK (e.g., age > 74 years, pregnant, HIV+, or chronic medical condition) AND [2]  > 72 hours (3 days) since evaluated by HCP AND [3] symptoms not improved  Answer Assessment - Initial Assessment Questions 1. DIAGNOSIS CONFIRMATION: "When was the influenza diagnosed?" "By whom?" "Did you get a test for it?"     03/27/18 2. MEDICINES: "Were you prescribed any medications for the influenza?"  (e.g., zanamivir [Relenza], oseltamavir [Tamiflu]).      tamiflu 3. ONSET of SYMPTOMS: "When did your symptoms start?"     Seen in office 03/27/18 4. SYMPTOMS: "What symptoms are you most concerned about?" (e.g., runny nose, stuffy nose, sore throat, cough, breathing difficulty, fever)     Productive cough, chest and nasal congestion, sinus pain and pressure on entire face and ears hurt  5. COUGH: "How bad is the cough?"     moderate 6. FEVER: "Do you have a fever?" If so, ask: "What is your temperature, how was it measured, and when did it start?"     no 7. RESPIRATORY DISTRESS: "Are you having any trouble breathing?" If yes, ask: "Describe your breathing."      Shortness of breath with too much activity; inhaler has helped 8. FLU VACCINE: "Did you receive a flu shot this year?" (e.g., seasonal influenza, H1N1)     yes 9. PREGNANCY: "Is there any chance you are pregnant?" "When was your last menstrual period?"     No IUD 10. HIGH RISK for COMPLICATIONS: "Do you have any heart or lung problems? Do you have a  weakened immune system?" (e.g., CHF, COPD, asthma, HIV positive, chemotherapy, renal failure, diabetes mellitus, sickle cell anemia)       sensitivity to irritants  Protocols used: INFLUENZA FOLLOW-UP CALL-A-AH

## 2018-04-02 NOTE — Telephone Encounter (Signed)
Message relayed to patient. Verbalized understanding and denied questions.   

## 2018-04-02 NOTE — Addendum Note (Signed)
Addended by: Merrie Roof E on: 04/02/2018 01:06 PM   Modules accepted: Orders

## 2018-04-24 ENCOUNTER — Other Ambulatory Visit: Payer: Self-pay | Admitting: Family Medicine

## 2018-05-04 DIAGNOSIS — Z79899 Other long term (current) drug therapy: Secondary | ICD-10-CM | POA: Diagnosis not present

## 2018-05-04 DIAGNOSIS — M797 Fibromyalgia: Secondary | ICD-10-CM | POA: Diagnosis not present

## 2018-05-04 DIAGNOSIS — M25519 Pain in unspecified shoulder: Secondary | ICD-10-CM | POA: Diagnosis not present

## 2018-05-04 DIAGNOSIS — M792 Neuralgia and neuritis, unspecified: Secondary | ICD-10-CM | POA: Diagnosis not present

## 2018-05-04 DIAGNOSIS — G894 Chronic pain syndrome: Secondary | ICD-10-CM | POA: Diagnosis not present

## 2018-05-04 DIAGNOSIS — Z6841 Body Mass Index (BMI) 40.0 and over, adult: Secondary | ICD-10-CM | POA: Diagnosis not present

## 2018-05-04 DIAGNOSIS — M5137 Other intervertebral disc degeneration, lumbosacral region: Secondary | ICD-10-CM | POA: Diagnosis not present

## 2018-05-04 DIAGNOSIS — F17200 Nicotine dependence, unspecified, uncomplicated: Secondary | ICD-10-CM | POA: Diagnosis not present

## 2018-05-04 DIAGNOSIS — M25552 Pain in left hip: Secondary | ICD-10-CM | POA: Diagnosis not present

## 2018-05-04 DIAGNOSIS — M419 Scoliosis, unspecified: Secondary | ICD-10-CM | POA: Diagnosis not present

## 2018-05-04 DIAGNOSIS — M25551 Pain in right hip: Secondary | ICD-10-CM | POA: Diagnosis not present

## 2018-05-04 DIAGNOSIS — M4185 Other forms of scoliosis, thoracolumbar region: Secondary | ICD-10-CM | POA: Diagnosis not present

## 2018-05-04 DIAGNOSIS — M25569 Pain in unspecified knee: Secondary | ICD-10-CM | POA: Diagnosis not present

## 2018-05-09 DIAGNOSIS — S90922A Unspecified superficial injury of left foot, initial encounter: Secondary | ICD-10-CM | POA: Diagnosis not present

## 2018-05-09 DIAGNOSIS — M79672 Pain in left foot: Secondary | ICD-10-CM | POA: Diagnosis not present

## 2018-05-14 DIAGNOSIS — M7542 Impingement syndrome of left shoulder: Secondary | ICD-10-CM | POA: Diagnosis not present

## 2018-05-14 DIAGNOSIS — S92345A Nondisplaced fracture of fourth metatarsal bone, left foot, initial encounter for closed fracture: Secondary | ICD-10-CM | POA: Diagnosis not present

## 2018-05-14 DIAGNOSIS — M7541 Impingement syndrome of right shoulder: Secondary | ICD-10-CM | POA: Diagnosis not present

## 2018-06-03 DIAGNOSIS — M7541 Impingement syndrome of right shoulder: Secondary | ICD-10-CM | POA: Diagnosis not present

## 2018-06-03 DIAGNOSIS — M7542 Impingement syndrome of left shoulder: Secondary | ICD-10-CM | POA: Diagnosis not present

## 2018-06-03 DIAGNOSIS — S92345A Nondisplaced fracture of fourth metatarsal bone, left foot, initial encounter for closed fracture: Secondary | ICD-10-CM | POA: Diagnosis not present

## 2018-06-13 ENCOUNTER — Other Ambulatory Visit: Payer: Self-pay | Admitting: Family Medicine

## 2018-06-22 ENCOUNTER — Other Ambulatory Visit: Payer: PRIVATE HEALTH INSURANCE | Admitting: Obstetrics & Gynecology

## 2018-07-05 ENCOUNTER — Other Ambulatory Visit: Payer: Self-pay | Admitting: Family Medicine

## 2018-07-06 DIAGNOSIS — S93401A Sprain of unspecified ligament of right ankle, initial encounter: Secondary | ICD-10-CM | POA: Diagnosis not present

## 2018-09-10 ENCOUNTER — Other Ambulatory Visit: Payer: Self-pay

## 2018-09-10 ENCOUNTER — Ambulatory Visit (INDEPENDENT_AMBULATORY_CARE_PROVIDER_SITE_OTHER): Payer: BC Managed Care – PPO | Admitting: Family Medicine

## 2018-09-10 ENCOUNTER — Ambulatory Visit: Payer: Self-pay | Admitting: Family Medicine

## 2018-09-10 ENCOUNTER — Encounter: Payer: Self-pay | Admitting: Family Medicine

## 2018-09-10 DIAGNOSIS — J301 Allergic rhinitis due to pollen: Secondary | ICD-10-CM | POA: Diagnosis not present

## 2018-09-10 DIAGNOSIS — M791 Myalgia, unspecified site: Secondary | ICD-10-CM | POA: Diagnosis not present

## 2018-09-10 DIAGNOSIS — R5383 Other fatigue: Secondary | ICD-10-CM | POA: Diagnosis not present

## 2018-09-10 DIAGNOSIS — M797 Fibromyalgia: Secondary | ICD-10-CM

## 2018-09-10 DIAGNOSIS — R200 Anesthesia of skin: Secondary | ICD-10-CM

## 2018-09-10 DIAGNOSIS — K219 Gastro-esophageal reflux disease without esophagitis: Secondary | ICD-10-CM

## 2018-09-10 DIAGNOSIS — G629 Polyneuropathy, unspecified: Secondary | ICD-10-CM | POA: Diagnosis not present

## 2018-09-10 DIAGNOSIS — F322 Major depressive disorder, single episode, severe without psychotic features: Secondary | ICD-10-CM

## 2018-09-10 DIAGNOSIS — J0100 Acute maxillary sinusitis, unspecified: Secondary | ICD-10-CM | POA: Diagnosis not present

## 2018-09-10 DIAGNOSIS — G2581 Restless legs syndrome: Secondary | ICD-10-CM | POA: Diagnosis not present

## 2018-09-10 DIAGNOSIS — R202 Paresthesia of skin: Secondary | ICD-10-CM

## 2018-09-10 MED ORDER — GABAPENTIN 800 MG PO TABS: 800.0000 mg | ORAL_TABLET | Freq: Three times a day (TID) | ORAL | 1 refills | Status: DC

## 2018-09-10 MED ORDER — PANTOPRAZOLE SODIUM 40 MG PO TBEC: 40.0000 mg | DELAYED_RELEASE_TABLET | Freq: Two times a day (BID) | ORAL | 3 refills | Status: DC

## 2018-09-10 MED ORDER — AMOXICILLIN-POT CLAVULANATE 875-125 MG PO TABS: 1.0000 | ORAL_TABLET | Freq: Two times a day (BID) | ORAL | 0 refills | Status: DC

## 2018-09-10 MED ORDER — MONTELUKAST SODIUM 10 MG PO TABS: 10.0000 mg | ORAL_TABLET | Freq: Every day | ORAL | 1 refills | Status: DC

## 2018-09-10 NOTE — Progress Notes (Signed)
There were no vitals taken for this visit.   Subjective:    Patient ID: Sydney Valenzuela, female    DOB: Oct 27, 1980, 38 y.o.   MRN: [id number]  HPI: Sydney Valenzuela is a 38 y.o. female  Chief Complaint  Patient presents with  . Sinusitis    Facial pain, Sore throat, Drainage.   . Medication Management    Stopped Effexor due to anger   Facial swelling, pressure, facial pain, sore throat, drainage, congestion. Denies fevers, chills, CP, SOB, sick contacts. Taking zyrtec, benadryl, using sinus rinses. Nasal sprays cause headaches. Has had sinus surgey in the past which didn't help.   Also due for her 6 month f/u. Quit taking the effexor due to fatigue and anger issues. Seems to have been just a week or week and a half so quit taking that. Notices some heightened anxiety since being off but nothing else feels different really. Has failed cymbalta in the past as well. Feels current sxs are manageable, only one or two days that were really bad. Does have the hydroxyzine for as needed use but it makes her sleepy.   Still taking the gabapentin for her chronic fibromyalgia pain and numbness/tingling issues. Seeing Neurology soon for further workup of her progressive sxs and weakness.   Very concerned about weight gain. Has been unable to lose weight despite eating better and smaller portions.   Relevant past medical, surgical, family and social history reviewed and updated as indicated. Interim medical history since our last visit reviewed. Allergies and medications reviewed and updated.  Review of Systems  Per HPI unless specifically indicated above     Objective:    There were no vitals taken for this visit.  Wt Readings from Last 3 Encounters:  03/27/18 230 lb (104.3 kg)  03/11/18 228 lb (103.4 kg)  12/19/17 232 lb (105.2 kg)    Physical Exam Vitals signs and nursing note reviewed.  Constitutional:      General: She is not in acute distress.    Appearance: Normal appearance.   HENT:     Head: Atraumatic.     Right Ear: External ear normal.     Left Ear: External ear normal.     Nose: Congestion present.     Mouth/Throat:     Mouth: Mucous membranes are moist.     Pharynx: Oropharynx is clear. Posterior oropharyngeal erythema present.  Eyes:     Extraocular Movements: Extraocular movements intact.     Conjunctiva/sclera: Conjunctivae normal.  Neck:     Musculoskeletal: Normal range of motion.  Cardiovascular:     Comments: Unable to assess via virtual visit Pulmonary:     Effort: Pulmonary effort is normal. No respiratory distress.  Musculoskeletal: Normal range of motion.  Skin:    General: Skin is dry.     Findings: No erythema.  Neurological:     Mental Status: She is alert and oriented to person, place, and time.  Psychiatric:        Mood and Affect: Mood normal.        Thought Content: Thought content normal.        Judgment: Judgment normal.     Results for orders placed or performed in visit on 08/15/17  Cortisol  Result Value Ref Range   Cortisol 6.9 ug/dL  T3, free  Result Value Ref Range   T3, Free 3.4 2.0 - 4.4 pg/mL  T4  Result Value Ref Range   T4, Total 8.0 4.5 - 12.0  ug/dL      Assessment & Plan:   Problem List Items Addressed This Visit      Respiratory   Allergic rhinitis    Will add singulair given continued poor allergy control. Continue zyrtec and supportive care additionally        Digestive   Esophageal reflux    Stable on protonix, continue current regimen      Relevant Medications   pantoprazole (PROTONIX) 40 MG tablet     Other   Obesity, Class III, BMI 40-49.9 (morbid obesity) ([place])    Discussed lifestyle modifications, medication options. Pt agreeable to trying to increase exercise and work on diet more      Depression, major, single episode, severe ([place])    Stopped the effexor in case it was causing agitation and fatigue issues she was having. Feeling fairly well without medication for now, will  f/u if feeling she needs to go back on medication      Fibromyalgia    Improved by gabapentin, continue current regimen      Relevant Medications   gabapentin (NEURONTIN) 800 MG tablet   Numbness and tingling    Gabapentin helping, f/u with Neurology as scheduled for further evaluation       Other Visit Diagnoses    Acute maxillary sinusitis, recurrence not specified    -  Primary   Tx with augmentin, increase allergy regimen, supportive care. F/u if not improving   Relevant Medications   amoxicillin-clavulanate (AUGMENTIN) 875-125 MG tablet       Follow up plan: Return in about 6 months (around 03/12/2019) for CPE.

## 2018-09-15 ENCOUNTER — Ambulatory Visit (INDEPENDENT_AMBULATORY_CARE_PROVIDER_SITE_OTHER): Payer: BC Managed Care – PPO | Admitting: Family Medicine

## 2018-09-15 ENCOUNTER — Encounter: Payer: Self-pay | Admitting: Family Medicine

## 2018-09-15 ENCOUNTER — Ambulatory Visit: Payer: Self-pay | Admitting: *Deleted

## 2018-09-15 ENCOUNTER — Other Ambulatory Visit: Payer: Self-pay

## 2018-09-15 VITALS — BP 156/101

## 2018-09-15 DIAGNOSIS — Z20828 Contact with and (suspected) exposure to other viral communicable diseases: Secondary | ICD-10-CM

## 2018-09-15 DIAGNOSIS — Z20822 Contact with and (suspected) exposure to covid-19: Secondary | ICD-10-CM

## 2018-09-15 NOTE — Assessment & Plan Note (Signed)
Gabapentin helping, f/u with Neurology as scheduled for further evaluation

## 2018-09-15 NOTE — Assessment & Plan Note (Signed)
Stopped the effexor in case it was causing agitation and fatigue issues she was having. Feeling fairly well without medication for now, will f/u if feeling she needs to go back on medication

## 2018-09-15 NOTE — Progress Notes (Signed)
BP (!) 156/101 Comment: pt reported   Subjective:    Patient ID: Sydney Valenzuela, female    DOB: 06-26-1980, 37 y.o.   MRN: [id number]  HPI: Sydney Valenzuela is a 38 y.o. female  Chief Complaint  Patient presents with  . Follow-up    . This visit was completed via WebEx due to the restrictions of the COVID-19 pandemic. All issues as above were discussed and addressed. Physical exam was done as above through visual confirmation on WebEx. If it was felt that the patient should be evaluated in the office, they were directed there. The patient verbally consented to this visit. . Location of the patient: home . Location of the provider: home . Those involved with this call:  . Provider: Merrie Roof, PA-C . CMA: Tiffany Reel, CMA . Front Desk/Registration: Tawnia Side  . Time spent on call: 15 minutes with patient face to face via video conference. More than 50% of this time was spent in counseling and coordination of care. 5 minutes total spent in review of patient's record and preparation of their chart. I verified patient identity using two factors (patient name and date of birth). Patient consents verbally to being seen via telemedicine visit today.   Patient presenting today to discuss a possible exposure to COVID - 19. States she had a small family birthday celebration 5 days ago and has since found out that her daughter has developed suspicious sxs. Daughter was tested yesterday based on sxs that are consistent with COVID-19, awaiting results. Since, she has felt in her usual state of health - having fatigue but that's not uncommon for her. Denies cough, SOB, HAs, sore throat, fevers, myalgias.   Relevant past medical, surgical, family and social history reviewed and updated as indicated. Interim medical history since our last visit reviewed. Allergies and medications reviewed and updated.  Review of Systems  Per HPI unless specifically indicated above     Objective:    BP (!)  156/101 Comment: pt reported  Wt Readings from Last 3 Encounters:  03/27/18 230 lb (104.3 kg)  03/11/18 228 lb (103.4 kg)  12/19/17 232 lb (105.2 kg)    Physical Exam Vitals signs and nursing note reviewed.  Constitutional:      General: She is not in acute distress.    Appearance: Normal appearance.  HENT:     Head: Atraumatic.     Right Ear: External ear normal.     Left Ear: External ear normal.     Nose: Nose normal. No congestion.     Mouth/Throat:     Mouth: Mucous membranes are moist.     Pharynx: Oropharynx is clear. No posterior oropharyngeal erythema.  Eyes:     Extraocular Movements: Extraocular movements intact.     Conjunctiva/sclera: Conjunctivae normal.  Neck:     Musculoskeletal: Normal range of motion.  Cardiovascular:     Comments: Unable to assess via virtual visit Pulmonary:     Effort: Pulmonary effort is normal. No respiratory distress.  Musculoskeletal: Normal range of motion.  Skin:    General: Skin is dry.     Findings: No erythema.  Neurological:     Mental Status: She is alert and oriented to person, place, and time.  Psychiatric:        Mood and Affect: Mood normal.        Thought Content: Thought content normal.        Judgment: Judgment normal.     Results for orders  placed or performed in visit on 08/15/17  Cortisol  Result Value Ref Range   Cortisol 6.9 ug/dL  T3, free  Result Value Ref Range   T3, Free 3.4 2.0 - 4.4 pg/mL  T4  Result Value Ref Range   T4, Total 8.0 4.5 - 12.0 ug/dL      Assessment & Plan:   Problem List Items Addressed This Visit    None    Visit Diagnoses    Exposure to Covid-19 Virus    -  Primary   Declines testing, agreeable to self quarantine for 2 weeks. Return precautions if becoming symptomatic reviewed. F/u if needed       Follow up plan: Return if symptoms worsen or fail to improve.

## 2018-09-15 NOTE — Telephone Encounter (Signed)
Patient was with daughter this week end- she has developed symptoms and is awaiting test results.Patient is concerned that she has been exposed.  Reason for Disposition . [1] COVID-19 EXPOSURE (Close Contact) AND [2] within last 14 days BUT [3] NO symptoms  Answer Assessment - Initial Assessment Questions 1. CLOSE CONTACT: "Who is the person with the confirmed or suspected COVID-19 infection that you were exposed to?"     Daughter- symptomatic awaiting test results 2. PLACE of CONTACT: "Where were you when you were exposed to COVID-19?" (e.g., home, school, medical waiting room; which city?)     party 3. TYPE of CONTACT: "How much contact was there?" (e.g., sitting next to, live in same house, work in same office, same building)     Friday and Sunday 4. DURATION of CONTACT: "How long were you in contact with the COVID-19 patient?" (e.g., a few seconds, passed by person, a few minutes, live with the patient)     Close contact- hugged 5. DATE of CONTACT: "When did you have contact with a COVID-19 patient?" (e.g., how many days ago)     Sunday  6. TRAVEL: "Have you traveled out of the country recently?" If so, "When and where?"     * Also ask about out-of-state travel, since the CDC has identified some high-risk cities for community spread in the [place].     * Note: Travel becomes less relevant if there is widespread community transmission where the patient lives.     no 7. COMMUNITY SPREAD: "Are there lots of cases of COVID-19 (community spread) where you live?" (See public health department website, if unsure)       [place] 8. SYMPTOMS: "Do you have any symptoms?" (e.g., fever, cough, breathing difficulty)     No symptoms 9. PREGNANCY OR POSTPARTUM: "Is there any chance you are pregnant?" "When was your last menstrual period?" "Did you deliver in the last 2 weeks?"     n/a 10. HIGH RISK: "Do you have any heart or lung problems? Do you have a weak immune system?" (e.g., CHF, COPD,  asthma, HIV positive, chemotherapy, renal failure, diabetes mellitus, sickle cell anemia)       ? Testing for autoimmune  Protocols used: CORONAVIRUS (COVID-19) EXPOSURE-A-AH

## 2018-09-15 NOTE — Assessment & Plan Note (Signed)
Stable on protonix, continue current regimen

## 2018-09-15 NOTE — Assessment & Plan Note (Signed)
Discussed lifestyle modifications, medication options. Pt agreeable to trying to increase exercise and work on diet more

## 2018-09-15 NOTE — Assessment & Plan Note (Signed)
Improved by gabapentin, continue current regimen

## 2018-09-15 NOTE — Assessment & Plan Note (Signed)
Will add singulair given continued poor allergy control. Continue zyrtec and supportive care additionally

## 2018-09-29 ENCOUNTER — Other Ambulatory Visit: Payer: Self-pay

## 2018-09-29 ENCOUNTER — Ambulatory Visit (INDEPENDENT_AMBULATORY_CARE_PROVIDER_SITE_OTHER): Payer: BC Managed Care – PPO | Admitting: Family Medicine

## 2018-09-29 ENCOUNTER — Encounter: Payer: Self-pay | Admitting: Family Medicine

## 2018-09-29 VITALS — Ht 62.0 in | Wt 230.0 lb

## 2018-09-29 DIAGNOSIS — R531 Weakness: Secondary | ICD-10-CM | POA: Diagnosis not present

## 2018-09-29 DIAGNOSIS — M797 Fibromyalgia: Secondary | ICD-10-CM

## 2018-09-29 DIAGNOSIS — M791 Myalgia, unspecified site: Secondary | ICD-10-CM | POA: Diagnosis not present

## 2018-09-29 DIAGNOSIS — R5383 Other fatigue: Secondary | ICD-10-CM

## 2018-09-29 MED ORDER — MELOXICAM 15 MG PO TABS: 15.0000 mg | ORAL_TABLET | Freq: Every day | ORAL | 2 refills | Status: DC

## 2018-09-29 MED ORDER — PREGABALIN 100 MG PO CAPS: 100.0000 mg | ORAL_CAPSULE | Freq: Two times a day (BID) | ORAL | 0 refills | Status: DC

## 2018-09-29 NOTE — Progress Notes (Signed)
Ht '5\' 2"'$  (1.575 m)   Wt 230 lb (104.3 kg)   BMI 42.07 kg/m    Subjective:    Patient ID: Sydney Valenzuela, female    DOB: 1980-04-12, 38 y.o.   MRN: [id number]  HPI: Sydney Valenzuela is a 38 y.o. female  Chief Complaint  Patient presents with  . COVID Exposure Follow-up  . Hypertension    Patient states her blood pressure is high in the AM.   . Fibromyalgia    Wondering if she could increase diclofenac. Seeing neurologist.    . This visit was completed via WebEx due to the restrictions of the COVID-19 pandemic. All issues as above were discussed and addressed. Physical exam was done as above through visual confirmation on WebEx. If it was felt that the patient should be evaluated in the office, they were directed there. The patient verbally consented to this visit. . Location of the patient: home . Location of the provider: home . Those involved with this call:  . Provider: Merrie Roof, PA-C . CMA: Merilyn Baba, Barry . Front Desk/Registration: Miasia Side  . Time spent on call: 25 minutes with patient face to face via video conference. More than 50% of this time was spent in counseling and coordination of care. 10 minutes total spent in review of patient's record and preparation of their chart. I verified patient identity using two factors (patient name and date of birth). Patient consents verbally to being seen via telemedicine visit today.   Here today following up on her chronic pain sxs. Still dealing with significant weakness, exercise intolerance, myalgias, and intermittent numbness and tingling. States these sxs have been worsening over the past 3 years. Has been evaluated by Neurology with so far no abnormal findings. Awaiting second opinion. Currently on gabapentin 1200 mg at night, 800 mg BID during the day. States this helps a bit. Has had to quit working per patient due to these sxs and notes they are significantly impacting her quality of life. Has a known hx of  fibromyalgia. Denies falls/injuries, fevers, rashes, joint swelling, tick bites.   Relevant past medical, surgical, family and social history reviewed and updated as indicated. Interim medical history since our last visit reviewed. Allergies and medications reviewed and updated.  Review of Systems  Per HPI unless specifically indicated above     Objective:    Ht '5\' 2"'$  (1.575 m)   Wt 230 lb (104.3 kg)   BMI 42.07 kg/m   Wt Readings from Last 3 Encounters:  09/29/18 230 lb (104.3 kg)  03/27/18 230 lb (104.3 kg)  03/11/18 228 lb (103.4 kg)    Physical Exam Vitals signs and nursing note reviewed.  Constitutional:      General: She is not in acute distress.    Appearance: Normal appearance.  HENT:     Head: Atraumatic.     Right Ear: External ear normal.     Left Ear: External ear normal.     Nose: Nose normal. No congestion.     Mouth/Throat:     Mouth: Mucous membranes are moist.     Pharynx: Oropharynx is clear. No posterior oropharyngeal erythema.  Eyes:     Extraocular Movements: Extraocular movements intact.     Conjunctiva/sclera: Conjunctivae normal.  Neck:     Musculoskeletal: Normal range of motion.  Cardiovascular:     Comments: Unable to assess via virtual visit Pulmonary:     Effort: Pulmonary effort is normal. No respiratory distress.  Musculoskeletal:  Normal range of motion.  Skin:    General: Skin is dry.     Findings: No erythema.  Neurological:     Mental Status: She is alert and oriented to person, place, and time.  Psychiatric:        Mood and Affect: Mood normal.        Thought Content: Thought content normal.        Judgment: Judgment normal.     Results for orders placed or performed in visit on 08/15/17  Cortisol  Result Value Ref Range   Cortisol 6.9 ug/dL  T3, free  Result Value Ref Range   T3, Free 3.4 2.0 - 4.4 pg/mL  T4  Result Value Ref Range   T4, Total 8.0 4.5 - 12.0 ug/dL      Assessment & Plan:   Problem List Items  Addressed This Visit      Other   Fibromyalgia    Has been on high dose gabapentin for quite some time with only mild relief. Will trial lyrica instead of the gabapentin and monitor for benefit. Continue with workup from Neurology, may need Rheumatology consultation if no abnormal findings.       Relevant Medications   meloxicam (MOBIC) 15 MG tablet   pregabalin (LYRICA) 100 MG capsule   Weakness - Primary   Relevant Orders   CBC with Differential/Platelet   Vitamin D (25 hydroxy)   B12   Rocky mtn spotted fvr abs pnl(IgG+IgM)   Lyme Ab/Western Blot Reflex   ANA w/Reflex   Rheumatoid Factor   Sed Rate (ESR)    Other Visit Diagnoses    Fatigue, unspecified type       Relevant Orders   CBC with Differential/Platelet   Vitamin D (25 hydroxy)   B12   Rocky mtn spotted fvr abs pnl(IgG+IgM)   Lyme Ab/Western Blot Reflex   ANA w/Reflex   Rheumatoid Factor   Sed Rate (ESR)   Myalgia       Relevant Orders   CBC with Differential/Platelet   Vitamin D (25 hydroxy)   B12   Rocky mtn spotted fvr abs pnl(IgG+IgM)   Lyme Ab/Western Blot Reflex   ANA w/Reflex   Rheumatoid Factor   Sed Rate (ESR)       Follow up plan: Return in about 4 weeks (around 10/27/2018) for 1 month fibromyalgia.

## 2018-10-04 NOTE — Assessment & Plan Note (Signed)
Has been on high dose gabapentin for quite some time with only mild relief. Will trial lyrica instead of the gabapentin and monitor for benefit. Continue with workup from Neurology, may need Rheumatology consultation if no abnormal findings.

## 2018-10-06 ENCOUNTER — Encounter: Payer: Self-pay | Admitting: Family Medicine

## 2018-10-06 ENCOUNTER — Other Ambulatory Visit: Payer: Self-pay

## 2018-10-06 ENCOUNTER — Ambulatory Visit (INDEPENDENT_AMBULATORY_CARE_PROVIDER_SITE_OTHER): Payer: BC Managed Care – PPO | Admitting: Family Medicine

## 2018-10-06 ENCOUNTER — Telehealth: Payer: Self-pay | Admitting: *Deleted

## 2018-10-06 VITALS — Ht 62.0 in | Wt 230.0 lb

## 2018-10-06 DIAGNOSIS — R0602 Shortness of breath: Secondary | ICD-10-CM | POA: Diagnosis not present

## 2018-10-06 DIAGNOSIS — R05 Cough: Secondary | ICD-10-CM | POA: Diagnosis not present

## 2018-10-06 DIAGNOSIS — R509 Fever, unspecified: Secondary | ICD-10-CM | POA: Diagnosis not present

## 2018-10-06 DIAGNOSIS — Z20822 Contact with and (suspected) exposure to covid-19: Secondary | ICD-10-CM

## 2018-10-06 DIAGNOSIS — R059 Cough, unspecified: Secondary | ICD-10-CM

## 2018-10-06 MED ORDER — AZITHROMYCIN 250 MG PO TABS: ORAL_TABLET | ORAL | 0 refills | Status: DC

## 2018-10-06 MED ORDER — ALBUTEROL SULFATE (2.5 MG/3ML) 0.083% IN NEBU: 2.5000 mg | INHALATION_SOLUTION | Freq: Four times a day (QID) | RESPIRATORY_TRACT | 1 refills | Status: DC | PRN

## 2018-10-06 MED ORDER — PREDNISONE 10 MG PO TABS: ORAL_TABLET | ORAL | 0 refills | Status: DC

## 2018-10-06 NOTE — Telephone Encounter (Signed)
Scheduled patient for COVID 19 test tomorrow at 9:30 am at [facility] test site.  Testing protocol reviewed.

## 2018-10-06 NOTE — Progress Notes (Signed)
Ht 5\' 2"  (1.575 m)   Wt 230 lb (104.3 kg)   BMI 42.07 kg/m    Subjective:    Patient ID: Sydney Valenzuela, female    DOB: [birth year], 38 y.o.   MRN: [id number]  HPI: Sydney Valenzuela is a 38 y.o. female  Chief Complaint  Patient presents with  . Cough    Ongoing 4 days. Severe. Productive. Patient concerned over pneumonia.   . Shortness of Breath  . Wheezing  . Fatigue  . Nasal Congestion  . Generalized Body Aches  . Chills  . Loss of taste  . Loss of smell    . This visit was completed via WebEx due to the restrictions of the COVID-19 pandemic. All issues as above were discussed and addressed. Physical exam was done as above through visual confirmation on WebEx. If it was felt that the patient should be evaluated in the office, they were directed there. The patient verbally consented to this visit. . Location of the patient: home . Location of the provider: home . Those involved with this call:  . Provider: Merrie Roof, PA-C . CMA: Merilyn Baba, [place] . Front Desk/Registration: Jessicah Side  . Time spent on call: 25 minutes with patient face to face via video conference. More than 50% of this time was spent in counseling and coordination of care. 5 minutes total spent in review of patient's record and preparation of their chart. I verified patient identity using two factors (patient name and date of birth). Patient consents verbally to being seen via telemedicine visit today.   Sick x 5 days with fever, generalized body aches, loss of taste and smell, congestion, cough, chest tightness, SOB, wheezing. Taking lots of dayquil and nyquil with minimal relief. Has a nebulizer machine at home but no nebulizer solution for it so has not been using. Has a hx of pneumonia, concerned she has it again. Husband sick with similar sxs. No sick contacts with COVID 19 that they are aware of, no recent travel. Denies N/V/D.   Relevant past medical, surgical, family and social history reviewed and  updated as indicated. Interim medical history since our last visit reviewed. Allergies and medications reviewed and updated.  Review of Systems  Per HPI unless specifically indicated above     Objective:    Ht 5\' 2"  (1.575 m)   Wt 230 lb (104.3 kg)   BMI 42.07 kg/m   Wt Readings from Last 3 Encounters:  10/06/18 230 lb (104.3 kg)  09/29/18 230 lb (104.3 kg)  03/27/18 230 lb (104.3 kg)    Physical Exam Vitals signs and nursing note reviewed.  Constitutional:      General: She is not in acute distress.    Appearance: Normal appearance. She is ill-appearing.  HENT:     Head: Atraumatic.     Right Ear: External ear normal.     Left Ear: External ear normal.     Nose: Congestion present.     Mouth/Throat:     Mouth: Mucous membranes are moist.     Pharynx: Posterior oropharyngeal erythema present.  Eyes:     Extraocular Movements: Extraocular movements intact.     Conjunctiva/sclera: Conjunctivae normal.  Neck:     Musculoskeletal: Normal range of motion.  Cardiovascular:     Comments: Unable to assess via virtual visit Pulmonary:     Effort: Pulmonary effort is normal. No respiratory distress.     Breath sounds: Wheezing (audible during video visit, particularly after coughing  fit) present.  Musculoskeletal: Normal range of motion.  Skin:    General: Skin is dry.     Findings: No erythema.  Neurological:     Mental Status: She is alert and oriented to person, place, and time.  Psychiatric:        Mood and Affect: Mood normal.        Thought Content: Thought content normal.        Judgment: Judgment normal.     Results for orders placed or performed in visit on 08/15/17  Cortisol  Result Value Ref Range   Cortisol 6.9 ug/dL  T3, free  Result Value Ref Range   T3, Free 3.4 2.0 - 4.4 pg/mL  T4  Result Value Ref Range   T4, Total 8.0 4.5 - 12.0 ug/dL      Assessment & Plan:   Problem List Items Addressed This Visit    None    Visit Diagnoses    Cough     -  Primary   SOB (shortness of breath)       Fever, unspecified fever cause        Given sxs and current pandemic, concern for COVID 19. Will quarantine and refer for testing. Work note given as this was to be her first day of a new position. Will start prednisone and abx and send albuterol neb soln given her audible wheezes. Concern for developing pneumonia particularly given her hx of pneumonia. Recommended she go to ER if worsening over next 24 hours, pt declines to go at this time and wishes to just start medications and rest at home. She is concerned about being exposed to anything else at the ER. She is agreeable to going to ER if worsening and will f/u in about a week for virtual recheck. Await test results.    Follow up plan: Return in about 1 week (around 10/13/2018) for sick f/u.

## 2018-10-06 NOTE — Telephone Encounter (Signed)
-----   Message from [facility], [place] sent at 10/06/2018 11:00 AM EDT ----- Regarding: COVID 19 testing 5 days of worsening cough, chest tightness, wheezing, SOB, diarrhea, loss of taste and smell, congestion, fatigue, fever [employer]

## 2018-10-07 ENCOUNTER — Other Ambulatory Visit: Payer: PRIVATE HEALTH INSURANCE

## 2018-10-07 DIAGNOSIS — Z20822 Contact with and (suspected) exposure to covid-19: Secondary | ICD-10-CM

## 2018-10-07 DIAGNOSIS — R6889 Other general symptoms and signs: Secondary | ICD-10-CM | POA: Diagnosis not present

## 2018-10-12 LAB — NOVEL CORONAVIRUS, NAA: SARS-CoV-2, NAA: NOT DETECTED

## 2018-10-13 ENCOUNTER — Encounter: Payer: Self-pay | Admitting: Family Medicine

## 2018-11-09 ENCOUNTER — Other Ambulatory Visit: Payer: Self-pay

## 2018-11-09 ENCOUNTER — Ambulatory Visit (INDEPENDENT_AMBULATORY_CARE_PROVIDER_SITE_OTHER): Payer: BC Managed Care – PPO | Admitting: Family Medicine

## 2018-11-09 DIAGNOSIS — Z6841 Body Mass Index (BMI) 40.0 and over, adult: Secondary | ICD-10-CM

## 2018-11-09 DIAGNOSIS — M797 Fibromyalgia: Secondary | ICD-10-CM | POA: Diagnosis not present

## 2018-11-09 MED ORDER — PREGABALIN 150 MG PO CAPS: 150.0000 mg | ORAL_CAPSULE | Freq: Two times a day (BID) | ORAL | 2 refills | Status: DC

## 2018-11-09 NOTE — Progress Notes (Signed)
There were no vitals taken for this visit.   Subjective:    Patient ID: Sydney Valenzuela, female    DOB: 08-02-1980, 38 y.o.   MRN: [id number]  HPI: Sydney Valenzuela is a 38 y.o. female  Chief Complaint  Patient presents with  . Fibromyalgia    Follow-up on Meloxicam and Lyrica. Patient thinks Lyrica is making her more irratable but the muscle pain is a lot better, anxiety is a lot better.  Meloxicam not working.  . Allergies    Follow-up on Singulair. Patient states she thinks her post nasal drip is worse.    . This visit was completed via WebEx due to the restrictions of the COVID-19 pandemic. All issues as above were discussed and addressed. Physical exam was done as above through visual confirmation on WebEx. If it was felt that the patient should be evaluated in the office, they were directed there. The patient verbally consented to this visit. . Location of the patient: home . Location of the provider: work . Those involved with this call:  . Provider: Merrie Roof, PA-C . CMA: Merilyn Baba, [place] . Front Desk/Registration: Verginia Side  . Time spent on call: 25 minutes with patient face to face via video conference. More than 50% of this time was spent in counseling and coordination of care. 5 minutes total spent in review of patient's record and preparation of their chart. I verified patient identity using two factors (patient name and date of birth). Patient consents verbally to being seen via telemedicine visit today.   Has felt improvement with the lyrica for her fibromyalgia, feeling much more functional than she used to. Does seem a bit more irritable on it and having some weird dreams but those are tolerable because of how much improvement she's noticed.   Concerned about her weight, feels it's not moving even though she barely eats. Does not exercise regularly. Eating one meal per day typically.   Relevant past medical, surgical, family and social history reviewed and  updated as indicated. Interim medical history since our last visit reviewed. Allergies and medications reviewed and updated.  Review of Systems  Per HPI unless specifically indicated above     Objective:    There were no vitals taken for this visit.  Wt Readings from Last 3 Encounters:  10/06/18 230 lb (104.3 kg)  09/29/18 230 lb (104.3 kg)  03/27/18 230 lb (104.3 kg)    Physical Exam Vitals signs and nursing note reviewed.  Constitutional:      General: She is not in acute distress.    Appearance: Normal appearance.  HENT:     Head: Atraumatic.     Right Ear: External ear normal.     Left Ear: External ear normal.     Nose: Nose normal. No congestion.     Mouth/Throat:     Mouth: Mucous membranes are moist.     Pharynx: Oropharynx is clear. No posterior oropharyngeal erythema.  Eyes:     Extraocular Movements: Extraocular movements intact.     Conjunctiva/sclera: Conjunctivae normal.  Neck:     Musculoskeletal: Normal range of motion.  Cardiovascular:     Comments: Unable to assess via virtual visit Pulmonary:     Effort: Pulmonary effort is normal. No respiratory distress.  Musculoskeletal: Normal range of motion.  Skin:    General: Skin is dry.     Findings: No erythema.  Neurological:     Mental Status: She is alert and oriented to person,  place, and time.  Psychiatric:        Mood and Affect: Mood normal.        Thought Content: Thought content normal.        Judgment: Judgment normal.     Results for orders placed or performed in visit on 10/07/18  Novel Coronavirus, NAA ([facility])  Result Value Ref Range   SARS-CoV-2, NAA Not Detected Not Detected      Assessment & Plan:   Problem List Items Addressed This Visit      Other   Obesity, Class III, BMI 40-49.9 (morbid obesity) ([place])    Long discussion today about medication options vs strict lifestyle modifications. Increase exercise, eat more regularly but healthy meals. Pt opts to try lifestyle first       Fibromyalgia - Primary    Patient wanting to try higher dose, will increase lyrica to 150 mg BID and monitor for benefit. Call if side effects worsening. Continue muscle relaxer and NSAIDs prn for breakthrough sxs. Discussed importance of weight loss, anti inflammatory diet, and staying very active in keeping her pain under good control.       Relevant Medications   pregabalin (LYRICA) 150 MG capsule    25 minutes spent today in direct care and counseling.    Follow up plan: Return in about 3 months (around 02/09/2019) for Fibromyalgia, weight f/u.

## 2018-11-10 ENCOUNTER — Other Ambulatory Visit: Payer: BC Managed Care – PPO

## 2018-11-10 DIAGNOSIS — R531 Weakness: Secondary | ICD-10-CM

## 2018-11-10 DIAGNOSIS — R5383 Other fatigue: Secondary | ICD-10-CM | POA: Diagnosis not present

## 2018-11-10 DIAGNOSIS — M791 Myalgia, unspecified site: Secondary | ICD-10-CM

## 2018-11-12 LAB — CBC WITH DIFFERENTIAL/PLATELET
Basophils Absolute: 0.1 10*3/uL (ref 0.0–0.2)
Basos: 1 %
EOS (ABSOLUTE): 0.1 10*3/uL (ref 0.0–0.4)
Eos: 1 %
Hematocrit: 41.9 % (ref 34.0–46.6)
Hemoglobin: 14.3 g/dL (ref 11.1–15.9)
Immature Grans (Abs): 0.1 10*3/uL (ref 0.0–0.1)
Immature Granulocytes: 1 %
Lymphocytes Absolute: 2.5 10*3/uL (ref 0.7–3.1)
Lymphs: 22 %
MCH: 30 pg (ref 26.6–33.0)
MCHC: 34.1 g/dL (ref 31.5–35.7)
MCV: 88 fL (ref 79–97)
Monocytes Absolute: 0.4 10*3/uL (ref 0.1–0.9)
Monocytes: 3 %
Neutrophils Absolute: 8.3 10*3/uL — ABNORMAL HIGH (ref 1.4–7.0)
Neutrophils: 72 %
Platelets: 250 10*3/uL (ref 150–450)
RBC: 4.76 x10E6/uL (ref 3.77–5.28)
RDW: 12.9 % (ref 11.7–15.4)
WBC: 11.4 10*3/uL — ABNORMAL HIGH (ref 3.4–10.8)

## 2018-11-12 LAB — ROCKY MTN SPOTTED FVR ABS PNL(IGG+IGM)
RMSF IgG: NEGATIVE
RMSF IgM: 0.21 index (ref 0.00–0.89)

## 2018-11-12 LAB — ANA W/REFLEX: Anti Nuclear Antibody (ANA): NEGATIVE

## 2018-11-12 LAB — VITAMIN D 25 HYDROXY (VIT D DEFICIENCY, FRACTURES): Vit D, 25-Hydroxy: 31.4 ng/mL (ref 30.0–100.0)

## 2018-11-12 LAB — RHEUMATOID FACTOR: Rheumatoid fact SerPl-aCnc: <10 IU/mL (ref 0.0–13.9)

## 2018-11-12 LAB — SEDIMENTATION RATE: Sed Rate: 26 mm/hr (ref 0–32)

## 2018-11-12 LAB — VITAMIN B12: Vitamin B-12: 554 pg/mL (ref 232–1245)

## 2018-11-12 LAB — LYME AB/WESTERN BLOT REFLEX
LYME DISEASE AB, QUANT, IGM: <0.8 index (ref 0.00–0.79)
Lyme IgG/IgM Ab: <0.91 {ISR} (ref 0.00–0.90)

## 2018-11-12 NOTE — Assessment & Plan Note (Signed)
Patient wanting to try higher dose, will increase lyrica to 150 mg BID and monitor for benefit. Call if side effects worsening. Continue muscle relaxer and NSAIDs prn for breakthrough sxs. Discussed importance of weight loss, anti inflammatory diet, and staying very active in keeping her pain under good control.

## 2018-11-12 NOTE — Assessment & Plan Note (Signed)
Long discussion today about medication options vs strict lifestyle modifications. Increase exercise, eat more regularly but healthy meals. Pt opts to try lifestyle first

## 2018-11-28 ENCOUNTER — Other Ambulatory Visit: Payer: Self-pay | Admitting: Family Medicine

## 2018-12-24 ENCOUNTER — Ambulatory Visit: Payer: BC Managed Care – PPO | Admitting: Unknown Physician Specialty

## 2018-12-25 ENCOUNTER — Ambulatory Visit (INDEPENDENT_AMBULATORY_CARE_PROVIDER_SITE_OTHER): Payer: BC Managed Care – PPO | Admitting: Family Medicine

## 2018-12-25 ENCOUNTER — Encounter: Payer: Self-pay | Admitting: Family Medicine

## 2018-12-25 ENCOUNTER — Other Ambulatory Visit: Payer: Self-pay

## 2018-12-25 VITALS — BP 159/101 | Ht 62.0 in | Wt 247.0 lb

## 2018-12-25 DIAGNOSIS — J301 Allergic rhinitis due to pollen: Secondary | ICD-10-CM | POA: Diagnosis not present

## 2018-12-25 DIAGNOSIS — M722 Plantar fascial fibromatosis: Secondary | ICD-10-CM

## 2018-12-25 DIAGNOSIS — J0100 Acute maxillary sinusitis, unspecified: Secondary | ICD-10-CM | POA: Diagnosis not present

## 2018-12-25 NOTE — Progress Notes (Signed)
 BP (!) 159/101 (BP Location: Left Arm, Patient Position: Sitting, Cuff Size: Normal)   Ht 5' 2" (1.575 m)   Wt 247 lb (112 kg)   BMI 45.18 kg/m    Subjective:    Patient ID: Sydney Valenzuela, female    DOB: 06/14/1980, 38 y.o.   MRN: [id number]  HPI: Sydney Valenzuela is a 38 y.o. female  Chief Complaint  Patient presents with  . Cough    Ongoing 2 weeks.  . Nasal Congestion  . Sinusitis    . This visit was completed via WebEx due to the restrictions of the COVID-19 pandemic. All issues as above were discussed and addressed. Physical exam was done as above through visual confirmation on WebEx. If it was felt that the patient should be evaluated in the office, they were directed there. The patient verbally consented to this visit. . Location of the patient: in parked car . Location of the provider: work . Those involved with this call:  . Provider:  , PA-C . CMA: Keri Bullock, CMA . Front Desk/Registration: Joliza Johnson  . Time spent on call: 25 minutes with patient face to face via video conference. More than 50% of this time was spent in counseling and coordination of care. 5 minutes total spent in review of patient's record and preparation of their chart. I verified patient identity using two factors (patient name and date of birth). Patient consents verbally to being seen via telemedicine visit today.   Left sided maxillary sinus congestion, tenderness, headaches and congestion the past 2 weeks. Doing a bit better today. Allergic to dust per Allergy Specialist a few years ago who recommended allergy shots. Unsure if she's ready to go back and pursue this. Currently taking antihistamines prn. Tried singulair but didn't find that it helped much. Denies fever, chills, CP, SOB, sore throat. Husband also sick with similar sxs.   BPs have been creeping up per her home monitoring, 150s-170s/90s-100s. Denies CP, SOB, HAs, dizziness. Has been very stressed out lately and feels  this is contributing.   Foot pain since taking a big jump out of a truck to run after her dog several weeks ago. Hurts on the bottom of her feet where she thinks she landed wrong.   Very frustrated with her weight, despite eating 1 meal per day and trying to be as active as she can with her fibromyalgia she feels her weight keeps creeping up. Has never tried prescription medications for this in the past but has tried several types of diets without any lasting benefit. Does not follow a strict diet currently.   Relevant past medical, surgical, family and social history reviewed and updated as indicated. Interim medical history since our last visit reviewed. Allergies and medications reviewed and updated.  Review of Systems  Per HPI unless specifically indicated above     Objective:    BP (!) 159/101 (BP Location: Left Arm, Patient Position: Sitting, Cuff Size: Normal)   Ht 5' 2" (1.575 m)   Wt 247 lb (112 kg)   BMI 45.18 kg/m   Wt Readings from Last 3 Encounters:  12/25/18 247 lb (112 kg)  10/06/18 230 lb (104.3 kg)  09/29/18 230 lb (104.3 kg)    Physical Exam Vitals signs and nursing note reviewed.  Constitutional:      General: She is not in acute distress.    Appearance: Normal appearance.  HENT:     Head: Atraumatic.     Right Ear: External ear   normal.     Left Ear: External ear normal.     Nose: Congestion present.     Mouth/Throat:     Mouth: Mucous membranes are moist.     Pharynx: Oropharynx is clear. Posterior oropharyngeal erythema present.  Eyes:     Extraocular Movements: Extraocular movements intact.     Conjunctiva/sclera: Conjunctivae normal.  Neck:     Musculoskeletal: Normal range of motion.  Cardiovascular:     Comments: Unable to assess via virtual visit Pulmonary:     Effort: Pulmonary effort is normal. No respiratory distress.  Musculoskeletal: Normal range of motion.  Skin:    General: Skin is dry.     Findings: No erythema.  Neurological:      Mental Status: She is alert and oriented to person, place, and time.  Psychiatric:        Mood and Affect: Mood normal.        Thought Content: Thought content normal.        Judgment: Judgment normal.     Results for orders placed or performed in visit on 11/10/18  Sed Rate (ESR)  Result Value Ref Range   Sed Rate 26 0 - 32 mm/hr  Rheumatoid Factor  Result Value Ref Range   Rhuematoid fact SerPl-aCnc <10.0 0.0 - 13.9 IU/mL  ANA w/Reflex  Result Value Ref Range   Anti Nuclear Antibody (ANA) Negative Negative  Lyme Ab/Western Blot Reflex  Result Value Ref Range   Lyme IgG/IgM Ab <0.91 0.00 - 0.90 ISR   LYME DISEASE AB, QUANT, IGM <0.80 0.00 - 0.79 index  Rocky mtn spotted fvr abs pnl(IgG+IgM)  Result Value Ref Range   RMSF IgG Negative Negative   RMSF IgM 0.21 0.00 - 0.89 index  B12  Result Value Ref Range   Vitamin B-12 554 232 - 1,245 pg/mL  Vitamin D (25 hydroxy)  Result Value Ref Range   Vit D, 25-Hydroxy 31.4 30.0 - 100.0 ng/mL  CBC with Differential/Platelet  Result Value Ref Range   WBC 11.4 (H) 3.4 - 10.8 x10E3/uL   RBC 4.76 3.77 - 5.28 x10E6/uL   Hemoglobin 14.3 11.1 - 15.9 g/dL   Hematocrit 41.9 34.0 - 46.6 %   MCV 88 79 - 97 fL   MCH 30.0 26.6 - 33.0 pg   MCHC 34.1 31.5 - 35.7 g/dL   RDW 12.9 11.7 - 15.4 %   Platelets 250 150 - 450 x10E3/uL   Neutrophils 72 Not Estab. %   Lymphs 22 Not Estab. %   Monocytes 3 Not Estab. %   Eos 1 Not Estab. %   Basos 1 Not Estab. %   Neutrophils Absolute 8.3 (H) 1.4 - 7.0 x10E3/uL   Lymphocytes Absolute 2.5 0.7 - 3.1 x10E3/uL   Monocytes Absolute 0.4 0.1 - 0.9 x10E3/uL   EOS (ABSOLUTE) 0.1 0.0 - 0.4 x10E3/uL   Basophils Absolute 0.1 0.0 - 0.2 x10E3/uL   Immature Granulocytes 1 Not Estab. %   Immature Grans (Abs) 0.1 0.0 - 0.1 x10E3/uL      Assessment & Plan:   Problem List Items Addressed This Visit      Respiratory   Allergic rhinitis    Sxs not under good control with antihistamines or singulair. Patient will  consider referral back to Allergist to revisit options for better management        Musculoskeletal and Integument   Plantar fasciitis    Prednisone, stretches, epsom salt soaks, massage. F/u if not improving  Other   Obesity, Class III, BMI 40-49.9 (morbid obesity) ([place])    Interested in [place] after discussion about numerous agents for weight loss. Will trial rx in addition to strict diet and exercise regimen and monitor for benefit      Relevant Medications   Liraglutide -Weight Management (SAXENDA) 18 MG/3ML SOPN    Other Visit Diagnoses    Acute maxillary sinusitis, recurrence not specified    -  Primary   Tx with prednisone, mucinex, sinus rinses, and continued allergy regimen. F/u if worsening or not improving   Relevant Medications   predniSONE (DELTASONE) 10 MG tablet       Follow up plan: Return for as scheduled.

## 2018-12-28 MED ORDER — SAXENDA 18 MG/3ML ~~LOC~~ SOPN: PEN_INJECTOR | SUBCUTANEOUS | 3 refills | Status: DC

## 2018-12-28 MED ORDER — PREDNISONE 10 MG PO TABS: ORAL_TABLET | ORAL | 0 refills | Status: DC

## 2018-12-28 NOTE — Assessment & Plan Note (Signed)
Interested in [place] after discussion about numerous agents for weight loss. Will trial rx in addition to strict diet and exercise regimen and monitor for benefit

## 2018-12-28 NOTE — Assessment & Plan Note (Signed)
Prednisone, stretches, epsom salt soaks, massage. F/u if not improving

## 2018-12-28 NOTE — Assessment & Plan Note (Signed)
Sxs not under good control with antihistamines or singulair. Patient will consider referral back to Allergist to revisit options for better management

## 2018-12-29 ENCOUNTER — Telehealth: Payer: Self-pay

## 2018-12-29 NOTE — Telephone Encounter (Signed)
Prior Authorization initiated via CoverMyMeds for Saxenda 18MG /3ML pen-injectors Key: [id number]

## 2018-12-30 ENCOUNTER — Encounter: Payer: Self-pay | Admitting: Women's Health

## 2018-12-30 ENCOUNTER — Ambulatory Visit (INDEPENDENT_AMBULATORY_CARE_PROVIDER_SITE_OTHER): Payer: BC Managed Care – PPO | Admitting: Women's Health

## 2018-12-30 ENCOUNTER — Other Ambulatory Visit: Payer: Self-pay

## 2018-12-30 VITALS — BP 131/82 | Ht 62.0 in | Wt 250.0 lb

## 2018-12-30 DIAGNOSIS — Z3202 Encounter for pregnancy test, result negative: Secondary | ICD-10-CM

## 2018-12-30 DIAGNOSIS — Z30432 Encounter for removal of intrauterine contraceptive device: Secondary | ICD-10-CM | POA: Diagnosis not present

## 2018-12-30 DIAGNOSIS — Z30430 Encounter for insertion of intrauterine contraceptive device: Secondary | ICD-10-CM | POA: Diagnosis not present

## 2018-12-30 LAB — POCT URINE PREGNANCY: Preg Test, Ur: NEGATIVE

## 2018-12-30 MED ORDER — LEVONORGESTREL 19.5 MCG/DAY IU IUD
INTRAUTERINE_SYSTEM | Freq: Once | INTRAUTERINE | Status: AC
  Administered 2018-12-30: 14:00:00 via INTRAUTERINE

## 2018-12-30 NOTE — Progress Notes (Signed)
   IUD REMOVAL & RE-INSERTION Patient name: Sydney Valenzuela MRN [id number]  Date of birth: [birth year] Subjective Findings:   @Sydney Valenzuela is a 38 y.o. G35P0 Caucasian female being seen today for removal of a Mirena  IUD and insertion of a Liletta  IUD. Her IUD was placed 2015.   No LMP recorded. (Menstrual status: IUD). Last pap11/28/18. Results were:  normal  The risks and benefits of the method and placement have been thouroughly reviewed with the patient and all questions were answered.  Specifically the patient is aware of failure rate of 04/998, expulsion of the IUD and of possible perforation.  The patient is aware of irregular bleeding due to the method and understands the incidence of irregular bleeding diminishes with time.  Signed copy of informed consent in chart.  Pertinent History Reviewed:   Reviewed past medical,surgical, social, obstetrical and family history.  Reviewed problem list, medications and allergies. Objective Findings & Procedure:    Vitals:   12/30/18 1210  BP: 131/82  Weight: 250 lb (113.4 kg)  Height: 5\' 2"  (1.575 m)  Body mass index is 45.73 kg/m.  Results for orders placed or performed in visit on 12/30/18 (from the past 24 hour(s))  POCT urine pregnancy   Collection Time: 12/30/18 12:26 PM  Result Value Ref Range   Preg Test, Ur Negative Negative     Time out was performed.  A graves speculum was placed in the vagina.  The cervix was visualized, prepped using Betadine. The strings were visible. They were grasped and the Mirena IUD was easily removed. The cervix was then grasped with a single-tooth tenaculum. The uterus was found to be retroflexed and it sounded to 8 cm.  Liletta  IUD placed per manufacturer's recommendations without complications. The strings were trimmed to approximately 3 cm.  The patient tolerated the procedure well.   Informal transvaginal sonogram was performed and the proper placement of the IUD was verified.  Assessment &  Plan:   1) Mirena IUD removal & Liletta insertion The patient was given post procedure instructions, including signs and symptoms of infection and to check for the strings after each menses or each month, and refraining from intercourse or anything in the vagina for 3 days. She was given a care card with date IUD placed, and date IUD to be removed. She is scheduled for a f/u appointment in 4 weeks.  Orders Placed This Encounter  Procedures  . POCT urine pregnancy    Follow-up: Return in about 4 weeks (around 01/27/2019) for F/U.  [place], [facility] 12/30/2018 1:14 PM

## 2018-12-30 NOTE — Addendum Note (Signed)
Addended by: Octaviano Glow on: 12/30/2018 01:37 PM   Modules accepted: Orders

## 2018-12-30 NOTE — Patient Instructions (Signed)
 Nothing in vagina for 3 days (no sex, douching, tampons, etc...)  Check your strings once a month to make sure you can feel them, if you are not able to please let us know  If you develop a fever of 100.4 or more in the next few weeks, or if you develop severe abdominal pain, please let us know  Use a backup method of birth control, such as condoms, for 2 weeks     Intrauterine Device Insertion, Care After  This sheet gives you information about how to care for yourself after your procedure. Your health care provider may also give you more specific instructions. If you have problems or questions, contact your health care provider. What can I expect after the procedure? After the procedure, it is common to have:  Cramps and pain in the abdomen.  Light bleeding (spotting) or heavier bleeding that is like your menstrual period. This may last for up to a few days.  Lower back pain.  Dizziness.  Headaches.  Nausea. Follow these instructions at home:  Before resuming sexual activity, check to make sure that you can feel the IUD string(s). You should be able to feel the end of the string(s) below the opening of your cervix. If your IUD string is in place, you may resume sexual activity. ? If you had a hormonal IUD inserted more than 7 days after your most recent period started, you will need to use a backup method of birth control for 7 days after IUD insertion. Ask your health care provider whether this applies to you.  Continue to check that the IUD is still in place by feeling for the string(s) after every menstrual period, or once a month.  Take over-the-counter and prescription medicines only as told by your health care provider.  Do not drive or use heavy machinery while taking prescription pain medicine.  Keep all follow-up visits as told by your health care provider. This is important. Contact a health care provider if:  You have bleeding that is heavier or lasts longer  than a normal menstrual cycle.  You have a fever.  You have cramps or abdominal pain that get worse or do not get better with medicine.  You develop abdominal pain that is new or is not in the same area of earlier cramping and pain.  You feel lightheaded or weak.  You have abnormal or bad-smelling discharge from your vagina.  You have pain during sexual activity.  You have any of the following problems with your IUD string(s): ? The string bothers or hurts you or your sexual partner. ? You cannot feel the string. ? The string has gotten longer.  You can feel the IUD in your vagina.  You think you may be pregnant, or you miss your menstrual period.  You think you may have an STI (sexually transmitted infection). Get help right away if:  You have flu-like symptoms.  You have a fever and chills.  You can feel that your IUD has slipped out of place. Summary  After the procedure, it is common to have cramps and pain in the abdomen. It is also common to have light bleeding (spotting) or heavier bleeding that is like your menstrual period.  Continue to check that the IUD is still in place by feeling for the string(s) after every menstrual period, or once a month.  Keep all follow-up visits as told by your health care provider. This is important.  Contact your health care provider   you have problems with your IUD string(s), such as the string getting longer or bothering you or your sexual partner. This information is not intended to replace advice given to you by your health care provider. Make sure you discuss any questions you have with your health care provider. Document Released: 11/14/2010 Document Revised: 02/28/2017 Document Reviewed: 02/07/2016 Elsevier Patient Education  2020 [employer]  

## 2018-12-31 IMAGING — CR DG CHEST 2V
1 series · 2 of 2 positions shown · non-contrast
Comparison: None.

CLINICAL DATA: Left chest pain

EXAM:
CHEST  2 VIEW

[Series 1: w chest pa · 0.14mm/px · 2 of 2 slices shown]
[im 1/2]
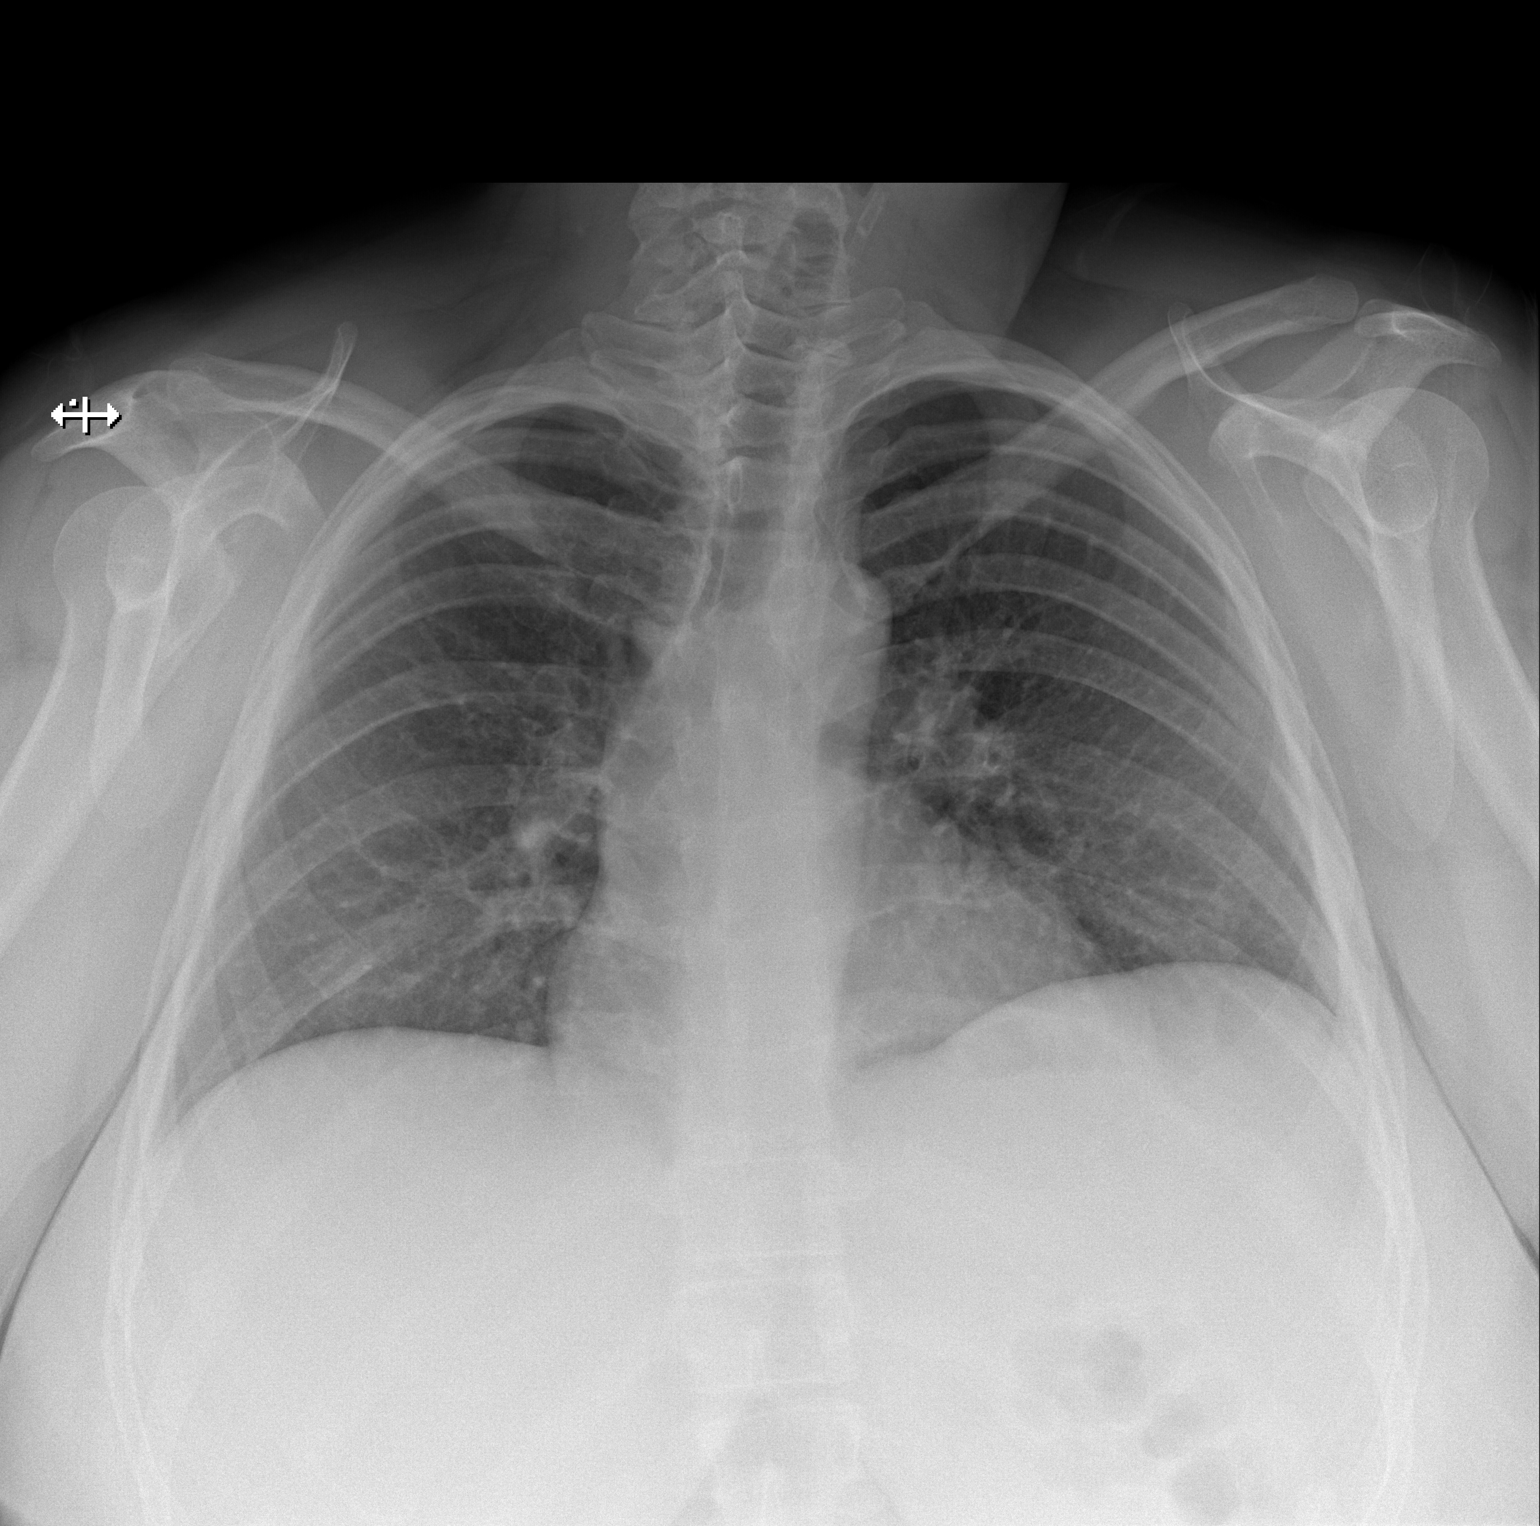
[im 2/2]
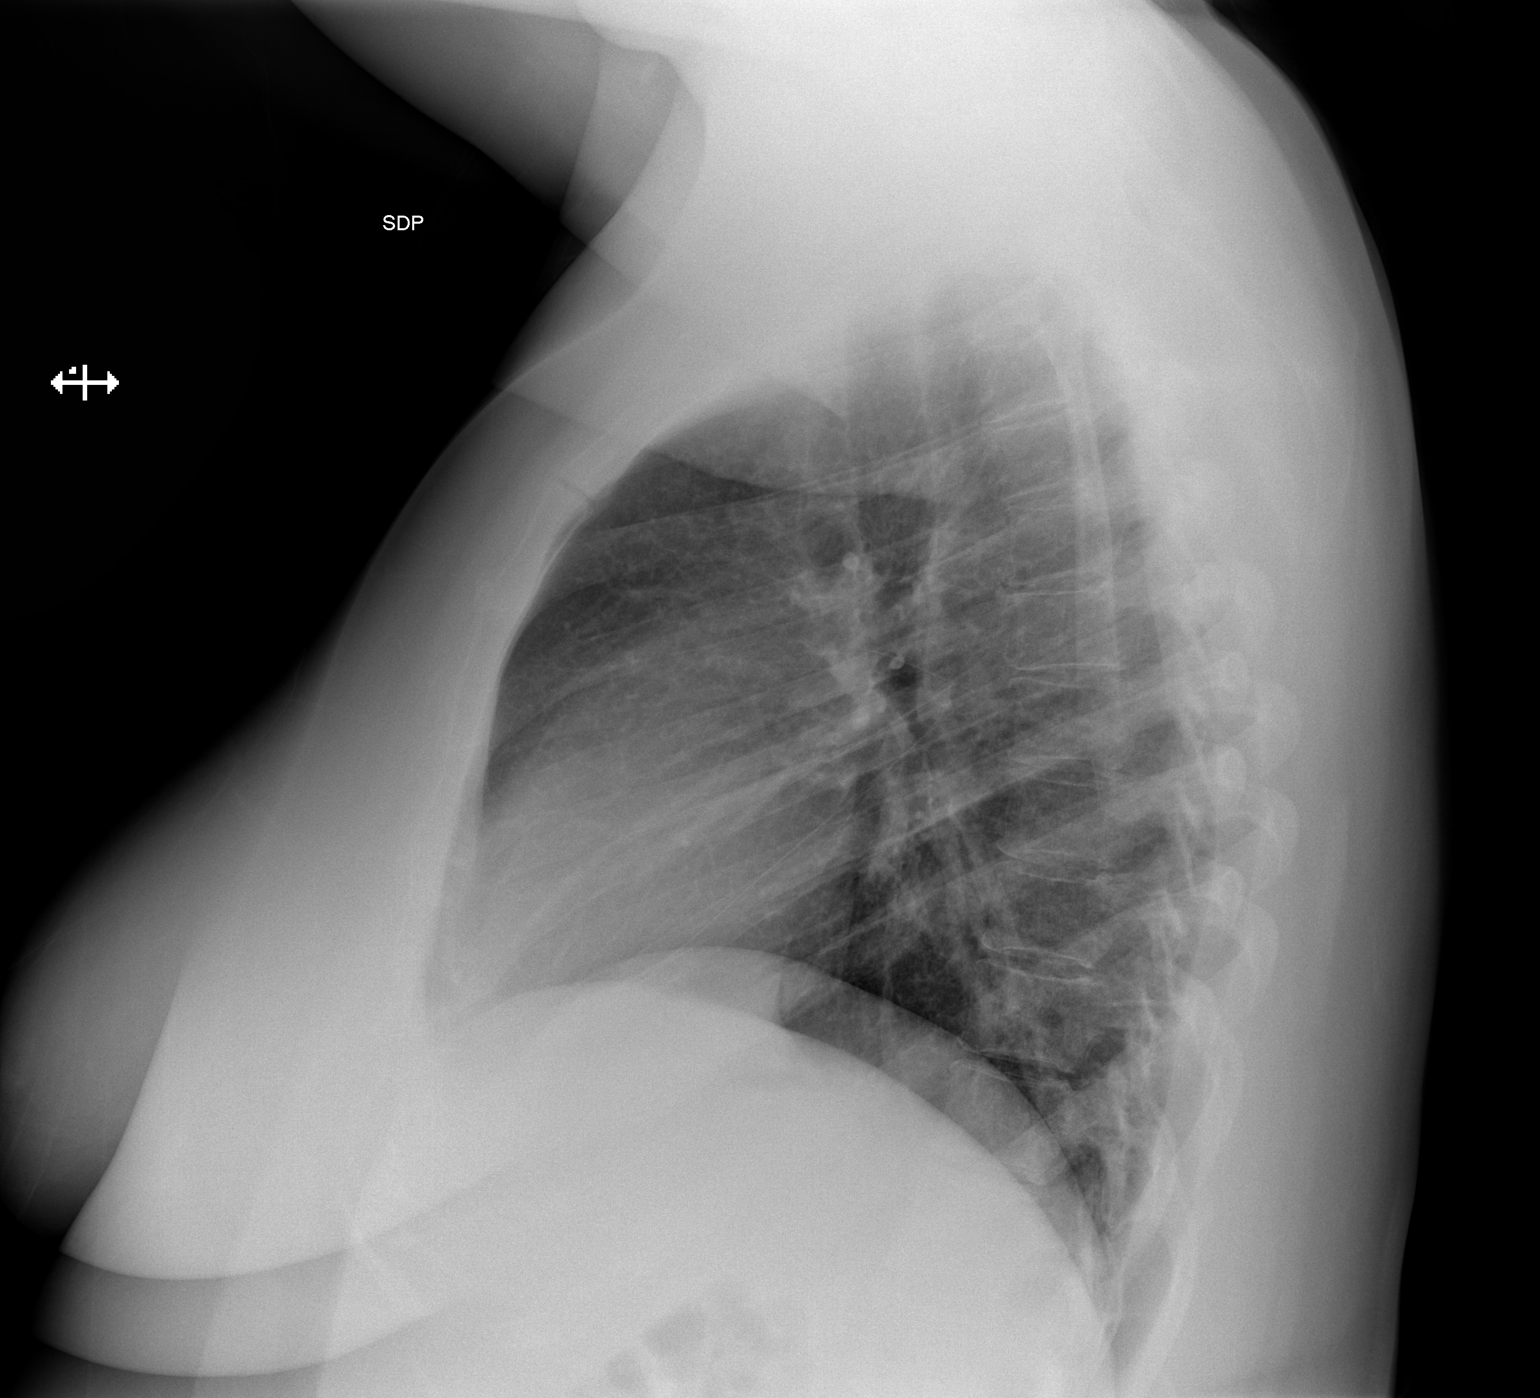

[2 of 2 positions shown; findings below may reference images not displayed]

FINDINGS: The heart size and mediastinal contours are within normal limits.
Both lungs are clear. The visualized skeletal structures are
unremarkable.
IMPRESSION: No active cardiopulmonary disease.

## 2019-01-04 NOTE — Telephone Encounter (Signed)
PA Approved

## 2019-01-06 ENCOUNTER — Other Ambulatory Visit: Payer: Self-pay | Admitting: Family Medicine

## 2019-01-06 MED ORDER — PEN NEEDLES 32G X 6 MM MISC: 1.0000 [IU] | Freq: Every day | 2 refills | Status: DC

## 2019-01-06 MED ORDER — AZITHROMYCIN 250 MG PO TABS: ORAL_TABLET | ORAL | 0 refills | Status: DC

## 2019-01-16 ENCOUNTER — Other Ambulatory Visit: Payer: Self-pay | Admitting: Family Medicine

## 2019-01-16 NOTE — Telephone Encounter (Signed)
Requested Prescriptions  Pending Prescriptions Disp Refills  . meloxicam (MOBIC) 15 MG tablet [Pharmacy Med Name: MELOXICAM 15MG  TABLETS] 30 tablet 2    Sig: TAKE 1 TABLET(15 MG) BY MOUTH DAILY     Analgesics:  COX2 Inhibitors Failed - 01/16/2019  4:17 PM      Failed - Cr in normal range and within 360 days    Creatinine, Ser  Date Value Ref Range Status  02/26/2017 0.63 0.57 - 1.00 mg/dL Final         Passed - HGB in normal range and within 360 days    Hemoglobin  Date Value Ref Range Status  11/10/2018 14.3 11.1 - 15.9 g/dL Final         Passed - Patient is not pregnant      Passed - Valid encounter within last 12 months    Recent Outpatient Visits          3 weeks ago Acute maxillary sinusitis, recurrence not specified   [facility], [place]   2 months ago [place], [place]   3 months ago Cough   [place], [place]   3 months ago Weakness   [place], [place]   4 months ago Exposure to [place], Lilia Argue, [place]      Future Appointments            In 1 week Gertie Exon, Royetta Crochet, CNM [place], [place]   In 1 month [place], Lilia Argue, [place], [facility]           Refused Prescriptions Disp Refills  . meloxicam (MOBIC) 15 MG tablet [Pharmacy Med Name: MELOXICAM 15MG  TABLETS] 30 tablet 2    Sig: TAKE 1 TABLET(15 MG) BY MOUTH DAILY     Analgesics:  COX2 Inhibitors Failed - 01/16/2019  4:17 PM      Failed - Cr in normal range and within 360 days    Creatinine, Ser  Date Value Ref Range Status  02/26/2017 0.63 0.57 - 1.00 mg/dL Final         Passed - HGB in normal range and within 360 days    Hemoglobin  Date Value Ref Range Status  11/10/2018 14.3 11.1 - 15.9 g/dL Final         Passed - Patient is not pregnant      Passed - Valid  encounter within last 12 months    Recent Outpatient Visits          3 weeks ago Acute maxillary sinusitis, recurrence not specified   [facility] Volney American, [place]   2 months ago [place], [place]   3 months ago Cough   [place], [place]   3 months ago Weakness   [place], [place]   4 months ago Exposure to [place], Lilia Argue, [place]      Future Appointments            In 1 week Gertie Exon, Royetta Crochet, CNM [place], [place]   In 1 month [place], Lilia Argue, [place], [place]

## 2019-01-25 DIAGNOSIS — M722 Plantar fascial fibromatosis: Secondary | ICD-10-CM | POA: Diagnosis not present

## 2019-01-27 ENCOUNTER — Other Ambulatory Visit: Payer: Self-pay

## 2019-01-27 ENCOUNTER — Telehealth (INDEPENDENT_AMBULATORY_CARE_PROVIDER_SITE_OTHER): Payer: BC Managed Care – PPO | Admitting: Women's Health

## 2019-01-27 ENCOUNTER — Encounter: Payer: Self-pay | Admitting: Women's Health

## 2019-01-27 DIAGNOSIS — Z30431 Encounter for routine checking of intrauterine contraceptive device: Secondary | ICD-10-CM

## 2019-01-27 IMAGING — CT CT ABDOMEN W/ CM
1 of 2 series · 15 of 32 positions shown, 19 images · IV contrast (APPLIED)
Comparison: None.

CLINICAL DATA: Right upper quadrant and epigastric pain.

EXAM:
CT ABDOMEN WITH CONTRAST
TECHNIQUE: Multidetector CT imaging of the abdomen was performed using the
standard protocol following bolus administration of intravenous
contrast.
CONTRAST:  100mL 1WFM98-DAA IOPAMIDOL (1WFM98-DAA) INJECTION 61%

[Series 2: axial st · axial · 0.79mm/px · z∈[-757,-482]mm · 15 of 61 slices shown, 19 images]
[im 3/61  soft-tissue]
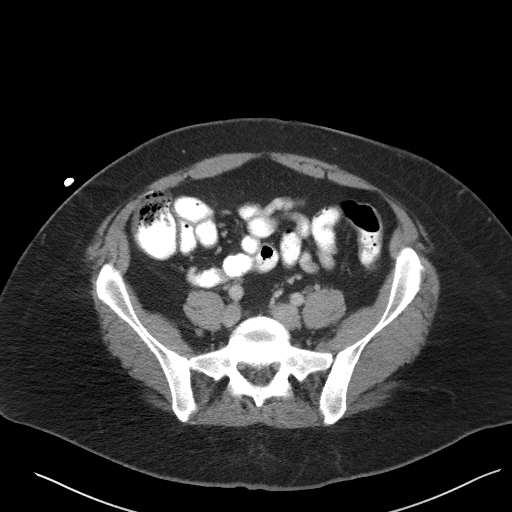
[im 3/61  bone]
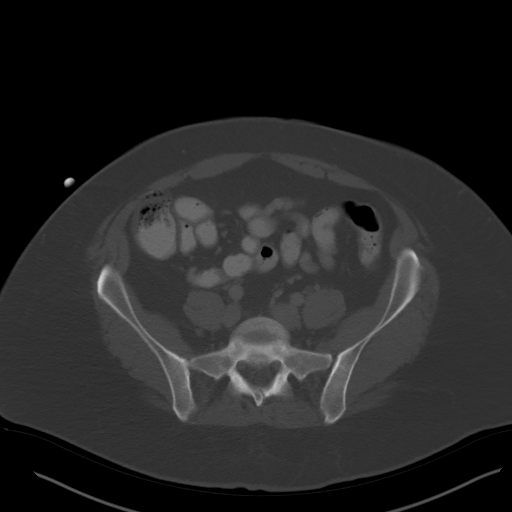
[im 9/61  soft-tissue]
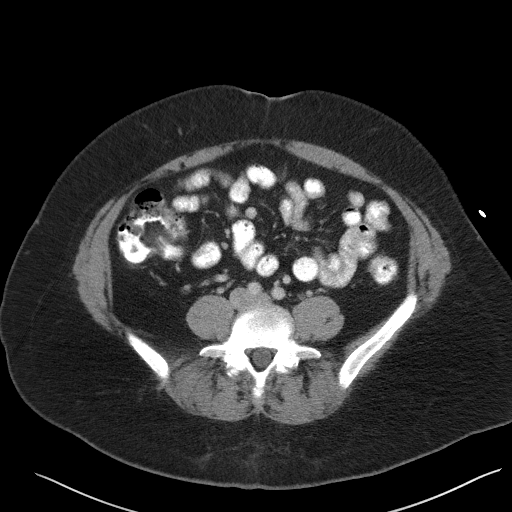
[im 14/61  soft-tissue]
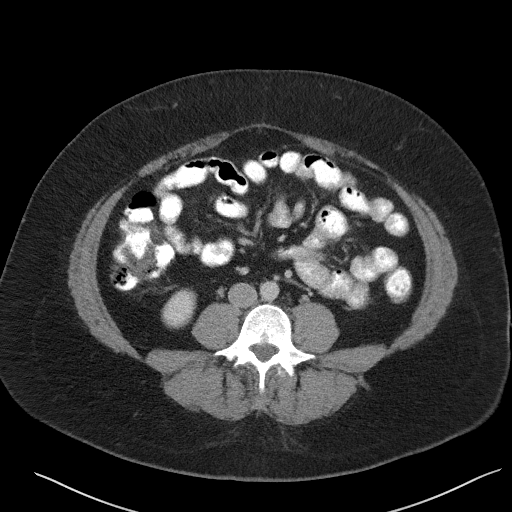
[im 17/61  soft-tissue]
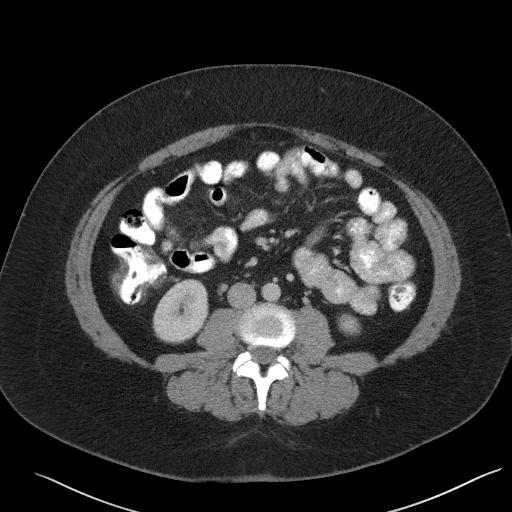
[im 22/61  soft-tissue]
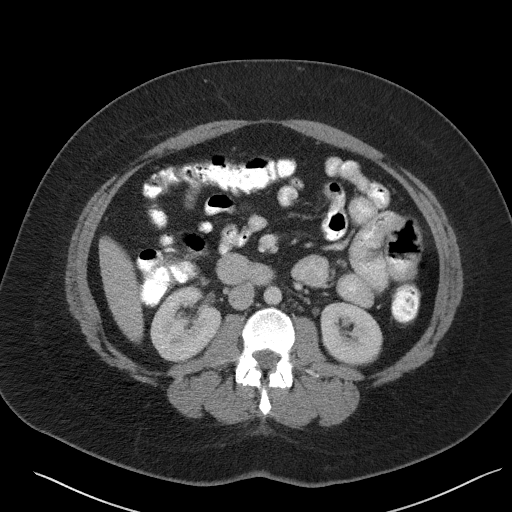
[im 25/61  soft-tissue]
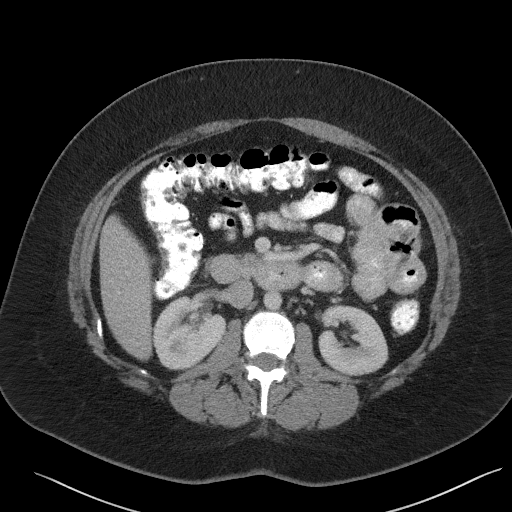
[im 30/61  soft-tissue]
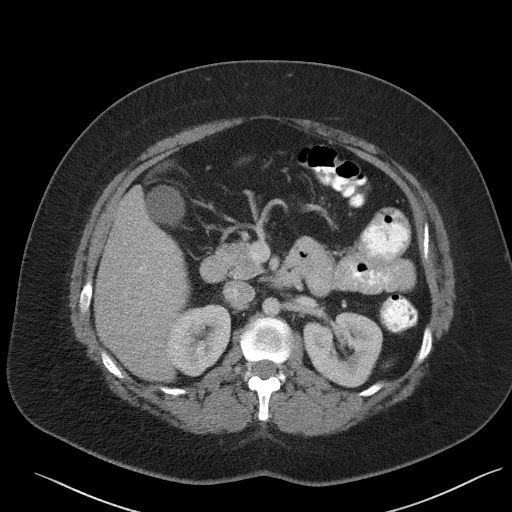
[im 36/61  soft-tissue]
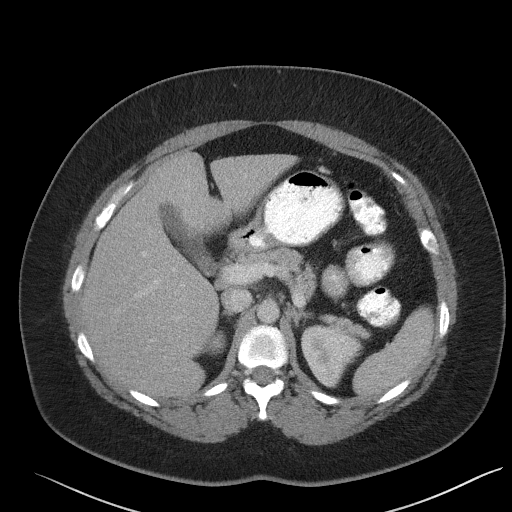
[im 39/61  soft-tissue]
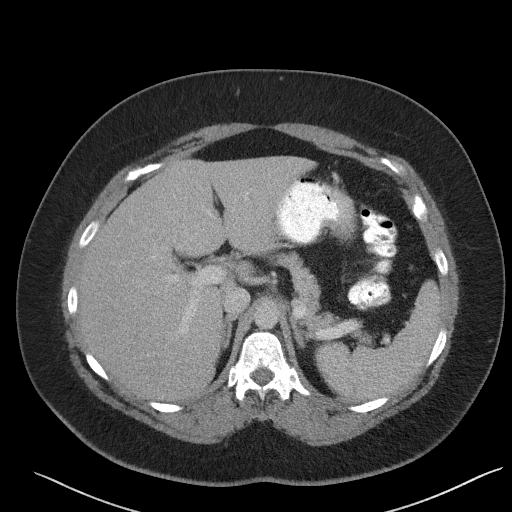
[im 39/61  bone]
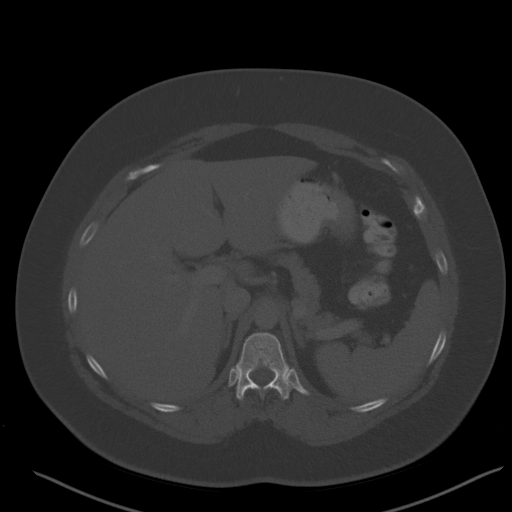
[im 44/61  soft-tissue]
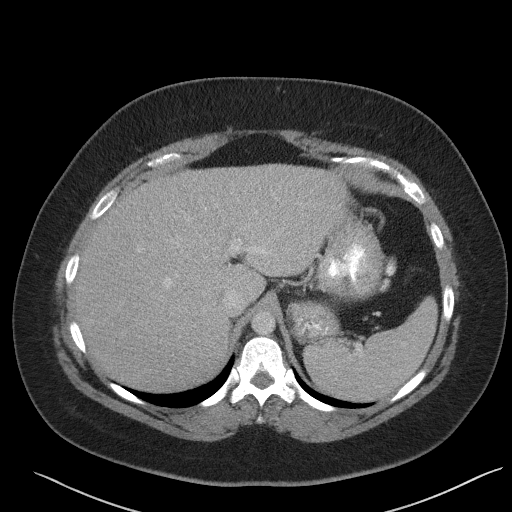
[im 47/61  soft-tissue]
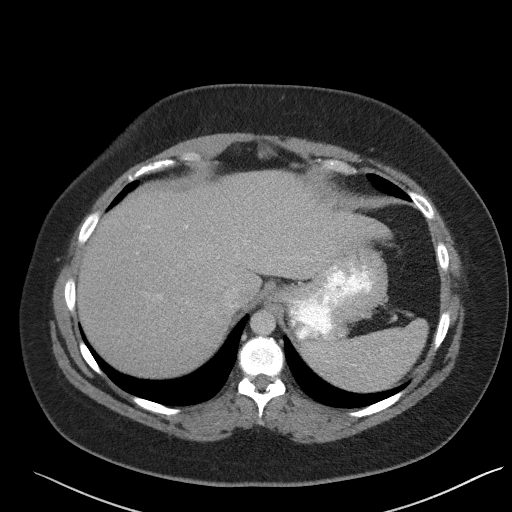
[im 50/61  lung]
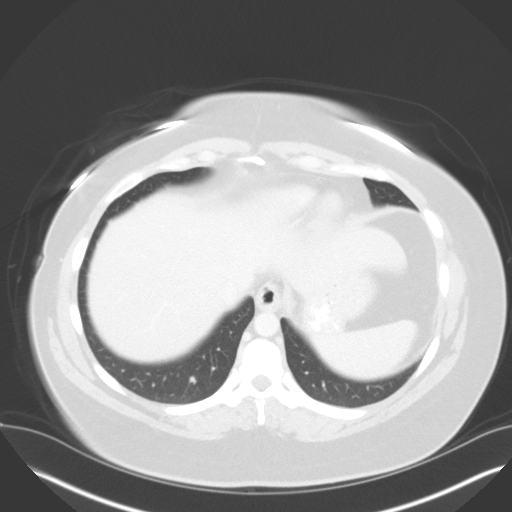
[im 52/61  soft-tissue]
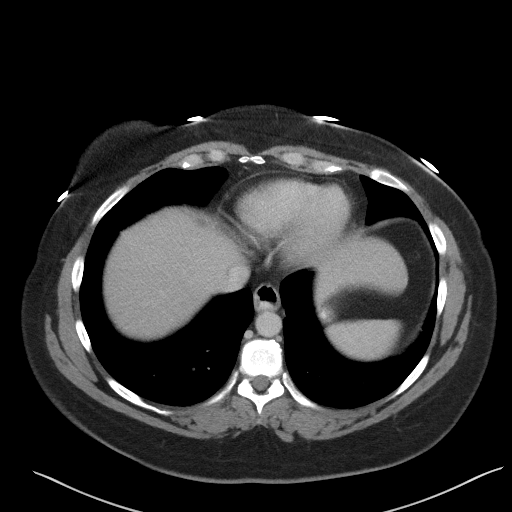
[im 52/61  lung]
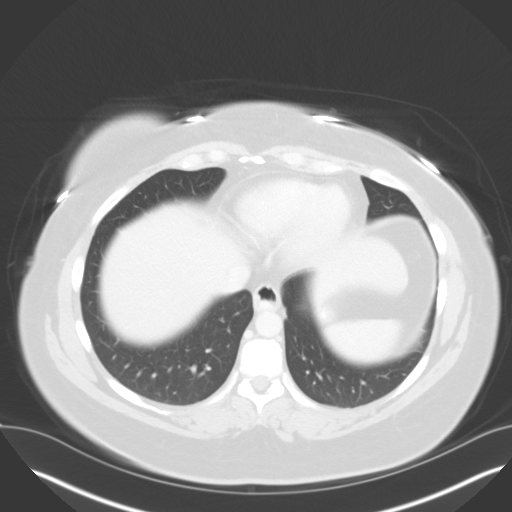
[im 55/61  lung]
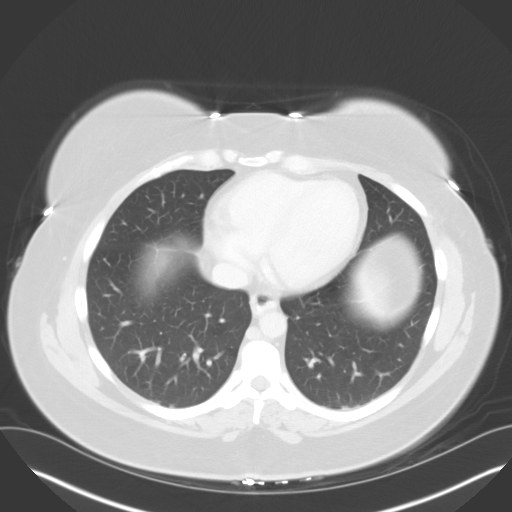
[im 58/61  soft-tissue]
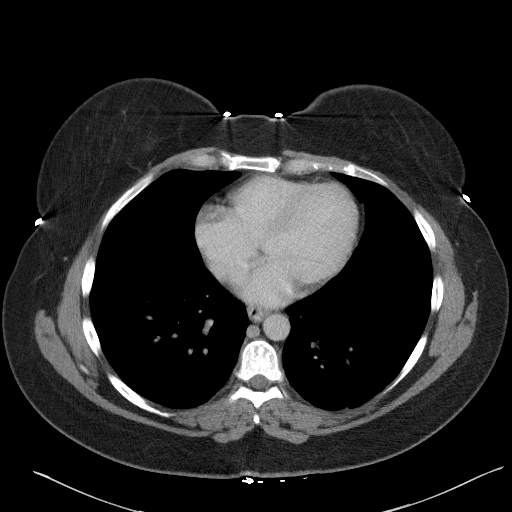
[im 58/61  lung]
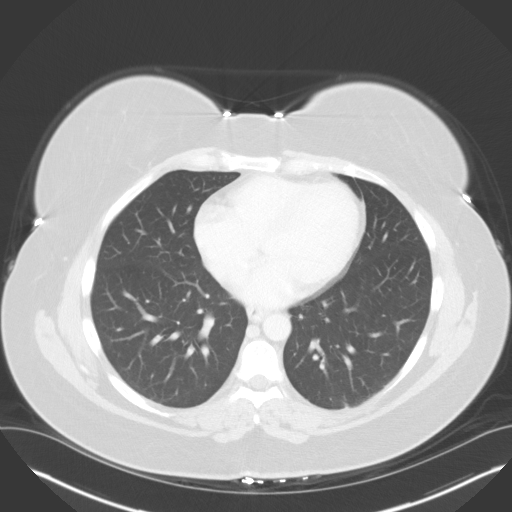

[15 of 32 positions shown; findings below may reference images not displayed]

FINDINGS: Lower chest:  Unremarkable.

Hepatobiliary: No focal abnormality within the liver parenchyma.
Tiny 2-3 mm calcified gallstone identified in the neck of the
gallbladder (image 59 series 5 and image 52 series 6). No
intrahepatic or extrahepatic biliary dilation.

Pancreas: No focal mass lesion. No dilatation of the main duct. No
intraparenchymal cyst. No peripancreatic edema.

Spleen: No splenomegaly. No focal mass lesion.

Adrenals/Urinary Tract: No adrenal nodule or mass. Kidneys
unremarkable.

Stomach/Bowel: Trace contrast residual identified in the distal
esophagus, potentially related to reflux or dysmotility. Stomach is
nondistended. No gastric wall thickening. No evidence of outlet
obstruction. No small bowel or colonic dilatation in the abdomen.

Vascular/Lymphatic: No abdominal aortic aneurysm. No abdominal
aortic atherosclerotic calcification. There is no gastrohepatic or
hepatoduodenal ligament lymphadenopathy. No intraperitoneal or
retroperitoneal lymphadenopathy.

Other: No intraperitoneal free fluid.

Musculoskeletal: Tiny umbilical hernia contains only fat. Bone
windows reveal no worrisome lytic or sclerotic osseous lesions.
IMPRESSION: 1. Tiny calcified gallstone noted in the neck of the gallbladder. No
gallbladder wall thickening or pericholecystic fluid. No biliary
dilatation.
2. Tiny umbilical hernia contains only fat.

## 2019-01-27 NOTE — Progress Notes (Signed)
   [facility] VIRTUAL GYN VISIT ENCOUNTER NOTE Patient name: Sydney Valenzuela MRN [id number]  Date of birth: 1980/04/19  I connected with patient on 01/27/19 at  8:30 AM EDT by MyChart video  and verified that I am speaking with the correct person using two identifiers.  Due to COVID-19 recommendations, pt is not currently in the office.    I discussed the limitations, risks, security and privacy concerns of performing an evaluation and management service by telephone and the availability of in person appointments. I also discussed with the patient that there may be a patient responsible charge related to this service. The patient expressed understanding and agreed to proceed.   Chief Complaint:   Follow-up  History of Present Illness:   Sydney Valenzuela is a 38 y.o. G2P0 Caucasian female being evaluated today for f/u after [place] IUD insertion on 12/30/18. Doing well, some irregular bleeding. No pain. Able to feel strings. Some vaginal odor, irritation. Started weight loss med, made her sugar drop so stopped it. Went on keto diet, has lost 20lbs! Working at [employer]. Much more energy.      No LMP recorded. (Menstrual status: IUD). The current method of family planning is IUD.  Last pap 02/26/17. Results were:  normal Review of Systems:   Pertinent items are noted in HPI Denies fever/chills, dizziness, headaches, visual disturbances, fatigue, shortness of breath, chest pain, abdominal pain, vomiting, abnormal vaginal discharge/itching/odor/irritation, problems with periods, bowel movements, urination, or intercourse unless otherwise stated above.  Pertinent History Reviewed:  Reviewed past medical,surgical, social, obstetrical and family history.  Reviewed problem list, medications and allergies. Physical Assessment:  There were no vitals filed for this visit.There is no height or weight on file to calculate BMI.       Physical Examination:   General:  Alert, oriented and cooperative.   Mental  Status: Normal mood and affect perceived. Normal judgment and thought content.  Physical exam deferred due to nature of the encounter  No results found for this or any previous visit (from the past 24 hour(s)).  Assessment & Plan:  1) IUD f/u> doing well, can take 3-32mths for bleeding to straighten out  2) Vaginal odor/irritation> make appt for self-swab w/ nurse  Meds: No orders of the defined types were placed in this encounter.   No orders of the defined types were placed in this encounter.   I discussed the assessment and treatment plan with the patient. The patient was provided an opportunity to ask questions and all were answered. The patient agreed with the plan and demonstrated an understanding of the instructions.   The patient was advised to call back or seek an in-person evaluation/go to the ED if the symptoms worsen or if the condition fails to improve as anticipated.  I provided 15 minutes of non-face-to-face time during this encounter.   Return for 1st available, nurse visit self swab.  [place], [facility] 01/27/2019 9:22 AM

## 2019-01-29 ENCOUNTER — Other Ambulatory Visit: Payer: BC Managed Care – PPO

## 2019-02-08 ENCOUNTER — Other Ambulatory Visit: Payer: Self-pay | Admitting: Family Medicine

## 2019-02-08 NOTE — Telephone Encounter (Signed)
Requested medication (s) are due for refill today: yes  Requested medication (s) are on the active medication list: yes  Last refill:  01/08/2019  Future visit scheduled: yes  Notes to clinic:  Refill cannot be delegated    Requested Prescriptions  Pending Prescriptions Disp Refills   pregabalin (LYRICA) 150 MG capsule [Pharmacy Med Name: PREGABALIN 150MG  CAPSULES] 60 capsule     Sig: TAKE 1 CAPSULE(150 MG) BY MOUTH TWICE DAILY     Not Delegated - Neurology:  Anticonvulsants - Controlled Failed - 02/08/2019  1:40 PM      Failed - This refill cannot be delegated      Passed - Valid encounter within last 12 months    Recent Outpatient Visits          1 month ago Acute maxillary sinusitis, recurrence not specified   [facility] Volney American, [place]   3 months ago [place], [place]   4 months ago Cough   [place], [place]   4 months ago Weakness   [place], [place]   4 months ago Exposure to [place], Lilia Argue, [place]      Future Appointments            In 1 week Orene Desanctis, Lilia Argue, [place], [place]

## 2019-02-08 NOTE — Telephone Encounter (Signed)
Routing to provider  

## 2019-02-09 ENCOUNTER — Other Ambulatory Visit: Payer: Self-pay

## 2019-02-09 DIAGNOSIS — Z20822 Contact with and (suspected) exposure to covid-19: Secondary | ICD-10-CM

## 2019-02-12 ENCOUNTER — Other Ambulatory Visit: Payer: Self-pay

## 2019-02-13 LAB — NOVEL CORONAVIRUS, NAA: SARS-CoV-2, NAA: NOT DETECTED

## 2019-02-15 ENCOUNTER — Other Ambulatory Visit: Payer: Self-pay

## 2019-02-15 ENCOUNTER — Encounter: Payer: Self-pay | Admitting: Family Medicine

## 2019-02-15 ENCOUNTER — Other Ambulatory Visit: Payer: BC Managed Care – PPO | Admitting: Women's Health

## 2019-02-15 ENCOUNTER — Ambulatory Visit: Payer: BC Managed Care – PPO | Admitting: Family Medicine

## 2019-02-15 ENCOUNTER — Telehealth: Payer: Self-pay | Admitting: Women's Health

## 2019-02-15 ENCOUNTER — Ambulatory Visit (INDEPENDENT_AMBULATORY_CARE_PROVIDER_SITE_OTHER): Payer: BC Managed Care – PPO | Admitting: Family Medicine

## 2019-02-15 VITALS — BP 126/86 | HR 76 | Wt 229.0 lb

## 2019-02-15 DIAGNOSIS — R0981 Nasal congestion: Secondary | ICD-10-CM | POA: Diagnosis not present

## 2019-02-15 DIAGNOSIS — J029 Acute pharyngitis, unspecified: Secondary | ICD-10-CM

## 2019-02-15 DIAGNOSIS — M797 Fibromyalgia: Secondary | ICD-10-CM | POA: Diagnosis not present

## 2019-02-15 DIAGNOSIS — E66813 Obesity, class 3: Secondary | ICD-10-CM

## 2019-02-15 MED ORDER — PREDNISONE 20 MG PO TABS: 40.0000 mg | ORAL_TABLET | Freq: Every day | ORAL | 0 refills | Status: DC

## 2019-02-15 NOTE — Progress Notes (Signed)
BP 126/86   Pulse 76   Wt 229 lb (103.9 kg)   BMI 41.88 kg/m    Subjective:    Patient ID: Sydney Valenzuela, female    DOB: May 06, 1980, 38 y.o.   MRN: [id number]  HPI: Sydney Valenzuela is a 38 y.o. female  Chief Complaint  Patient presents with  . Fibromyalgia  . URI    pt states she has been having increased body aches, sore throat, nausea, fatigue, and SOB over the last 3 weeks     . This visit was completed via WebEx due to the restrictions of the COVID-19 pandemic. All issues as above were discussed and addressed. Physical exam was done as above through visual confirmation on WebEx. If it was felt that the patient should be evaluated in the office, they were directed there. The patient verbally consented to this visit. . Location of the patient: home . Location of the provider: work . Those involved with this call:  . Provider: Merrie Roof, PA-C . CMA: Yvonna Alanis, [place] . Front Desk/Registration: [place] Side  . Time spent on call: 25 minutes with patient face to face via video conference. More than 50% of this time was spent in counseling and coordination of care. 5 minutes total spent in review of patient's record and preparation of their chart. I verified patient identity using two factors (patient name and date of birth). Patient consents verbally to being seen via telemedicine visit today.   Started a new job about a month ago that is very physically demanding and causing worsening muscle pain, fatigue. Has had some significant diarrhea about a week ago. Also has occasional nausea, sore throat, congestion, headaches. COVID 19 neg last week after sxs started. Taking allergy meds prn with minimal relief.   Had started the saxenda but had to stop it about a month ago due to fatigue once she got onto the higher doses. Now doing a strict keto diet - went from 256 down to 229 lb in the past month per patient.   Does feel like the lyrica is helping with her fibromyalgia,  missed a dose and noticed a huge difference in her pain sxs.   Relevant past medical, surgical, family and social history reviewed and updated as indicated. Interim medical history since our last visit reviewed. Allergies and medications reviewed and updated.  Review of Systems  Per HPI unless specifically indicated above     Objective:    BP 126/86   Pulse 76   Wt 229 lb (103.9 kg)   BMI 41.88 kg/m   Wt Readings from Last 3 Encounters:  02/15/19 229 lb (103.9 kg)  12/30/18 250 lb (113.4 kg)  12/25/18 247 lb (112 kg)    Physical Exam Vitals signs and nursing note reviewed.  Constitutional:      General: She is not in acute distress.    Appearance: Normal appearance.  HENT:     Head: Atraumatic.     Right Ear: External ear normal.     Left Ear: External ear normal.     Nose: Nose normal. No congestion.     Mouth/Throat:     Mouth: Mucous membranes are moist.     Pharynx: Oropharynx is clear. Posterior oropharyngeal erythema present.  Eyes:     Extraocular Movements: Extraocular movements intact.     Conjunctiva/sclera: Conjunctivae normal.  Neck:     Musculoskeletal: Normal range of motion.  Cardiovascular:     Comments: Unable to assess via virtual  visit Pulmonary:     Effort: Pulmonary effort is normal. No respiratory distress.  Musculoskeletal: Normal range of motion.  Skin:    General: Skin is dry.     Findings: No erythema.  Neurological:     Mental Status: She is alert and oriented to person, place, and time.  Psychiatric:        Mood and Affect: Mood normal.        Thought Content: Thought content normal.        Judgment: Judgment normal.     Results for orders placed or performed in visit on 02/09/19  Novel Coronavirus, NAA ([facility])   Specimen: Nasopharyngeal(NP) swabs in vial transport medium   NASOPHARYNGE  TESTING  Result Value Ref Range   SARS-CoV-2, NAA Not Detected Not Detected      Assessment & Plan:   Problem List Items Addressed This  Visit      Other   Obesity, Class III, BMI 40-49.9 (morbid obesity) ([place])    Stopped saxenda due to fatigue side effects. Working on [employer] right now and seems to be successful with that. Continue current regimen      Fibromyalgia    Improved with the lyrica, but feels she's having a flare up right now. Hoping the prednisone will help with this as well. Continue staying active, working on weight loss, antiinflammatory diet      Relevant Medications   predniSONE (DELTASONE) 20 MG tablet    Other Visit Diagnoses    Sore throat    -  Primary   COVID neg, suspect allergy flare vs viral illness. Tx with prednisone, mucinex, and allergy regimen. F/u if not improving   Nasal congestion           Follow up plan: Return in about 3 months (around 05/18/2019) for Fibromyalgia f/u.

## 2019-02-15 NOTE — Telephone Encounter (Signed)
Patient came in for her appointment today.  During the COVID screening, the patient stated that she was having fever and chills.  She stated that she saw her primary care doctor this morning an was prescribed steroids.  I checked with Knute Neu, CNM, [facility] and she stated that the patient could not come to the back with a fever.  The patient stated that she has a discharge like blowing mucus.  This information was relayed to [facility].  Kim stated when the patient is symptom free that she can schedule an appointment.  The patient was upset and slammed the door on her way out.

## 2019-02-22 NOTE — Assessment & Plan Note (Signed)
Stopped saxenda due to fatigue side effects. Working on [employer] right now and seems to be successful with that. Continue current regimen

## 2019-02-22 NOTE — Assessment & Plan Note (Signed)
Improved with the lyrica, but feels she's having a flare up right now. Hoping the prednisone will help with this as well. Continue staying active, working on weight loss, antiinflammatory diet

## 2019-03-09 ENCOUNTER — Other Ambulatory Visit: Payer: PRIVATE HEALTH INSURANCE | Admitting: Adult Health

## 2019-03-10 ENCOUNTER — Encounter: Payer: Self-pay | Admitting: Family Medicine

## 2019-03-10 ENCOUNTER — Telehealth: Payer: Self-pay | Admitting: Family Medicine

## 2019-03-10 NOTE — Telephone Encounter (Signed)
Called pt to schedule 3 month f/u from 11/16 appointment, no answer, left vm, sending letter.

## 2019-03-11 ENCOUNTER — Other Ambulatory Visit: Payer: Self-pay

## 2019-03-11 DIAGNOSIS — Z20822 Contact with and (suspected) exposure to covid-19: Secondary | ICD-10-CM

## 2019-03-13 LAB — NOVEL CORONAVIRUS, NAA: SARS-CoV-2, NAA: NOT DETECTED

## 2019-03-22 ENCOUNTER — Other Ambulatory Visit: Payer: Self-pay

## 2019-03-22 MED ORDER — PREGABALIN 150 MG PO CAPS: ORAL_CAPSULE | ORAL | 0 refills | Status: DC

## 2019-03-22 NOTE — Telephone Encounter (Signed)
Patient last seen 02/15/19 

## 2019-03-30 DIAGNOSIS — Z20828 Contact with and (suspected) exposure to other viral communicable diseases: Secondary | ICD-10-CM | POA: Diagnosis not present

## 2019-04-19 ENCOUNTER — Other Ambulatory Visit: Payer: Self-pay | Admitting: Family Medicine

## 2019-04-19 NOTE — Telephone Encounter (Signed)
Requested medication (s) are due for refill today: yes  Requested medication (s) are on the active medication list: yes  Last refill:  03/22/2019  Future visit scheduled:no  Notes to clinic: not delegated   Requested Prescriptions  Pending Prescriptions Disp Refills   pregabalin (LYRICA) 150 MG capsule [Pharmacy Med Name: PREGABALIN 150MG  CAPSULES] 60 capsule     Sig: TAKE 1 CAPSULE(150 MG) BY MOUTH TWICE DAILY      Not Delegated - Neurology:  Anticonvulsants - Controlled Failed - 04/19/2019 12:48 PM      Failed - This refill cannot be delegated      Passed - Valid encounter within last 12 months    Recent Outpatient Visits           2 months ago Sore throat   [facility] Volney American, [place]   3 months ago Acute maxillary sinusitis, recurrence not specified   [facility] Volney American, [place]   5 months ago [place], [place]   6 months ago Cough   [place], [place]   6 months ago Weakness   [facility] Merrie Roof [place], [place]

## 2019-04-19 NOTE — Telephone Encounter (Signed)
Patient last seen 02/15/19 

## 2019-05-10 ENCOUNTER — Encounter: Payer: Self-pay | Admitting: Family Medicine

## 2019-05-10 ENCOUNTER — Ambulatory Visit (INDEPENDENT_AMBULATORY_CARE_PROVIDER_SITE_OTHER): Payer: BC Managed Care – PPO | Admitting: Family Medicine

## 2019-05-10 ENCOUNTER — Other Ambulatory Visit: Payer: Self-pay

## 2019-05-10 VITALS — BP 124/90 | HR 131 | Temp 99.3°F | Ht 62.0 in | Wt 197.0 lb

## 2019-05-10 DIAGNOSIS — R519 Headache, unspecified: Secondary | ICD-10-CM

## 2019-05-10 DIAGNOSIS — R112 Nausea with vomiting, unspecified: Secondary | ICD-10-CM

## 2019-05-10 DIAGNOSIS — R509 Fever, unspecified: Secondary | ICD-10-CM

## 2019-05-10 DIAGNOSIS — R197 Diarrhea, unspecified: Secondary | ICD-10-CM

## 2019-05-10 MED ORDER — ONDANSETRON 4 MG PO TBDP: 4.0000 mg | ORAL_TABLET | Freq: Three times a day (TID) | ORAL | 0 refills | Status: DC | PRN

## 2019-05-10 MED ORDER — PREDNISONE 20 MG PO TABS: 40.0000 mg | ORAL_TABLET | Freq: Every day | ORAL | 0 refills | Status: DC

## 2019-05-10 NOTE — Progress Notes (Signed)
BP 124/90 (Patient Position: Standing)   Pulse (!) 131   Temp 99.3 F (37.4 C) (Temporal)   Ht 5\' 2"  (1.575 m)   Wt 197 lb (89.4 kg)   BMI 36.03 kg/m    Subjective:    Patient ID: Sydney Valenzuela, female    DOB: Jul 04, 1980, 39 y.o.   MRN: [id number]  HPI: Sydney Valenzuela is a 39 y.o. female  Chief Complaint  Patient presents with  . Fatigue    exposure to covid at work. symptoms started last Thursday. negative covid test, 05/06/2019  . Headache  . Chills  . Diarrhea  . Emesis    . This visit was completed via WebEx due to the restrictions of the COVID-19 pandemic. All issues as above were discussed and addressed. Physical exam was done as above through visual confirmation on WebEx. If it was felt that the patient should be evaluated in the office, they were directed there. The patient verbally consented to this visit. . Location of the patient: home . Location of the provider: work . Those involved with this call:  . Provider: Merrie Roof, PA-C . CMA: Lesle Chris, [place] . Front Desk/Registration: [place] Side  . Time spent on call: 25 minutes with patient face to face via video conference. More than 50% of this time was spent in counseling and coordination of care. 5 minutes total spent in review of patient's record and preparation of their chart. I verified patient identity using two factors (patient name and date of birth). Patient consents verbally to being seen via telemedicine visit today.   About 4-5 days of fatigue, body aches, headache, chills, fever up to 102, diarrhea, N/V. Trying aleve and goody powder with no relief. Currently today sxs much more mild and no more fever. Denies CP, SOB, cough, wheezing. Had COVID test Thursday that was negative but was informed yesterday that a work contact tested positive over weekend.   Also of note, has been very successful with keto diet and is down over 30 lb so far this winter. Feeling well overall other than this illness and able  to work full time for the first time in a long time.   Relevant past medical, surgical, family and social history reviewed and updated as indicated. Interim medical history since our last visit reviewed. Allergies and medications reviewed and updated.  Review of Systems  Per HPI unless specifically indicated above     Objective:    BP 124/90 (Patient Position: Standing)   Pulse (!) 131   Temp 99.3 F (37.4 C) (Temporal)   Ht 5\' 2"  (1.575 m)   Wt 197 lb (89.4 kg)   BMI 36.03 kg/m   Wt Readings from Last 3 Encounters:  05/10/19 197 lb (89.4 kg)  02/15/19 229 lb (103.9 kg)  12/30/18 250 lb (113.4 kg)    Physical Exam Vitals and nursing note reviewed.  Constitutional:      General: She is not in acute distress.    Appearance: Normal appearance.  HENT:     Head: Atraumatic.     Right Ear: External ear normal.     Left Ear: External ear normal.     Nose: Nose normal. No congestion.     Mouth/Throat:     Mouth: Mucous membranes are moist.     Pharynx: Oropharynx is clear. No posterior oropharyngeal erythema.  Eyes:     Extraocular Movements: Extraocular movements intact.     Conjunctiva/sclera: Conjunctivae normal.  Cardiovascular:  Comments: Unable to assess via virtual visit Pulmonary:     Effort: Pulmonary effort is normal. No respiratory distress.  Musculoskeletal:        General: Normal range of motion.     Cervical back: Normal range of motion.  Skin:    General: Skin is dry.     Findings: No erythema.  Neurological:     Mental Status: She is alert and oriented to person, place, and time.  Psychiatric:        Mood and Affect: Mood normal.        Thought Content: Thought content normal.        Judgment: Judgment normal.     Results for orders placed or performed in visit on 03/11/19  Novel Coronavirus, NAA ([facility])   Specimen: Nasopharyngeal(NP) swabs in vial transport medium   NASOPHARYNGE  TESTING  Result Value Ref Range   SARS-CoV-2, NAA Not  Detected Not Detected      Assessment & Plan:   Problem List Items Addressed This Visit      Other   Obesity, Class III, BMI 40-49.9 (morbid obesity) ([place])    Congratulated excellent success with keto diet. Continue current regimen with close monitoring       Other Visit Diagnoses    Acute nonintractable headache, unspecified headache type    -  Primary   Will retest for COVID, isolate until results and sxs improve. Continue OTC remedies, start prednisone and zofran for sxs   Relevant Orders   Novel Coronavirus, NAA ([facility])   Fever, unspecified fever cause       Appears resolved at this time but continue supportive care prn   Relevant Orders   Novel Coronavirus, NAA ([facility])   Nausea vomiting and diarrhea       Zofran, BRAT diet, push fluids. Continue to monitor, f/u if not resolving       Follow up plan: Return for as scheduled for fibromyalgia f/u.

## 2019-05-10 NOTE — Assessment & Plan Note (Signed)
Congratulated excellent success with keto diet. Continue current regimen with close monitoring

## 2019-05-14 ENCOUNTER — Other Ambulatory Visit: Payer: Self-pay

## 2019-05-14 ENCOUNTER — Telehealth (INDEPENDENT_AMBULATORY_CARE_PROVIDER_SITE_OTHER): Payer: BC Managed Care – PPO | Admitting: Family Medicine

## 2019-05-14 ENCOUNTER — Encounter: Payer: Self-pay | Admitting: Family Medicine

## 2019-05-14 VITALS — BP 144/86 | HR 94 | Temp 99.2°F | Ht 62.0 in | Wt 198.0 lb

## 2019-05-14 DIAGNOSIS — J069 Acute upper respiratory infection, unspecified: Secondary | ICD-10-CM

## 2019-05-14 MED ORDER — HYDROCOD POLST-CPM POLST ER 10-8 MG/5ML PO SUER: 5.0000 mL | Freq: Two times a day (BID) | ORAL | 0 refills | Status: DC | PRN

## 2019-05-14 MED ORDER — PREDNISONE 20 MG PO TABS: 40.0000 mg | ORAL_TABLET | Freq: Every day | ORAL | 0 refills | Status: DC

## 2019-05-14 MED ORDER — AZITHROMYCIN 250 MG PO TABS: ORAL_TABLET | ORAL | 0 refills | Status: DC

## 2019-05-14 NOTE — Progress Notes (Signed)
BP (!) 144/86   Pulse 94   Temp 99.2 F (37.3 C) (Temporal)   Ht 5\' 2"  (1.575 m)   Wt 198 lb (89.8 kg)   BMI 36.21 kg/m    Subjective:    Patient ID: Sydney Valenzuela, female    DOB: [birth year], 39 y.o.   MRN: [id number]  HPI: Sydney Valenzuela is a 39 y.o. female  Chief Complaint  Patient presents with  . Fibromyalgia  . Covid 19    f/u pt states has had a negative covid test this past Monday    . This visit was completed via WebEx due to the restrictions of the COVID-19 pandemic. All issues as above were discussed and addressed. Physical exam was done as above through visual confirmation on WebEx. If it was felt that the patient should be evaluated in the office, they were directed there. The patient verbally consented to this visit. . Location of the patient: home . Location of the provider: work . Those involved with this call:  . Provider: Merrie Roof, PA-C . CMA: Lesle Chris, [place] . Front Desk/Registration: Normalee Side  . Time spent on call: 15 minutes with patient face to face via video conference. More than 50% of this time was spent in counseling and coordination of care. 5 minutes total spent in review of patient's record and preparation of their chart. I verified patient identity using two factors (patient name and date of birth). Patient consents verbally to being seen via telemedicine visit today.   Sore throat worsening, chest tenderness, body aches, productive cough, headache, SOB for over a week now. Negative COVID test last week. Denies fever, N/V/D, new sick contacts, recent travel. Finished prednisone which helped for a few days, and taking OTC cold and flu medications with minimal relief.   Relevant past medical, surgical, family and social history reviewed and updated as indicated. Interim medical history since our last visit reviewed. Allergies and medications reviewed and updated.  Review of Systems  Per HPI unless specifically indicated above       Objective:    BP (!) 144/86   Pulse 94   Temp 99.2 F (37.3 C) (Temporal)   Ht 5\' 2"  (1.575 m)   Wt 198 lb (89.8 kg)   BMI 36.21 kg/m   Wt Readings from Last 3 Encounters:  05/14/19 198 lb (89.8 kg)  05/10/19 197 lb (89.4 kg)  02/15/19 229 lb (103.9 kg)    Physical Exam Vitals and nursing note reviewed.  Constitutional:      General: She is not in acute distress.    Appearance: Normal appearance.  HENT:     Head: Atraumatic.     Right Ear: External ear normal.     Left Ear: External ear normal.     Nose: Nose normal. No congestion.     Mouth/Throat:     Mouth: Mucous membranes are moist.     Pharynx: Oropharynx is clear. No posterior oropharyngeal erythema.  Eyes:     Extraocular Movements: Extraocular movements intact.     Conjunctiva/sclera: Conjunctivae normal.  Cardiovascular:     Comments: Unable to assess via virtual visit Pulmonary:     Effort: Pulmonary effort is normal. No respiratory distress.  Musculoskeletal:        General: Normal range of motion.     Cervical back: Normal range of motion.  Skin:    General: Skin is dry.     Findings: No erythema.  Neurological:  Mental Status: She is alert and oriented to person, place, and time.  Psychiatric:        Mood and Affect: Mood normal.        Thought Content: Thought content normal.        Judgment: Judgment normal.     Results for orders placed or performed in visit on 03/11/19  Novel Coronavirus, NAA ([facility])   Specimen: Nasopharyngeal(NP) swabs in vial transport medium   NASOPHARYNGE  TESTING  Result Value Ref Range   SARS-CoV-2, NAA Not Detected Not Detected      Assessment & Plan:   Problem List Items Addressed This Visit    None    Visit Diagnoses    Upper respiratory tract infection, unspecified type    -  Primary   COVID neg, persistent and worsening. Start zpak, tussionex prn. Prednisone sent in case not improving. In person immediate f/u if worsening   Relevant Medications    azithromycin (ZITHROMAX) 250 MG tablet       Follow up plan: Return if symptoms worsen or fail to improve.

## 2019-05-18 ENCOUNTER — Encounter: Payer: Self-pay | Admitting: Family Medicine

## 2019-05-18 ENCOUNTER — Telehealth (INDEPENDENT_AMBULATORY_CARE_PROVIDER_SITE_OTHER): Payer: BC Managed Care – PPO | Admitting: Family Medicine

## 2019-05-18 ENCOUNTER — Other Ambulatory Visit: Payer: Self-pay

## 2019-05-18 DIAGNOSIS — J069 Acute upper respiratory infection, unspecified: Secondary | ICD-10-CM | POA: Diagnosis not present

## 2019-05-18 MED ORDER — DOXYCYCLINE HYCLATE 100 MG PO TABS: 100.0000 mg | ORAL_TABLET | Freq: Two times a day (BID) | ORAL | 0 refills | Status: DC

## 2019-05-18 NOTE — Progress Notes (Signed)
There were no vitals taken for this visit.   Subjective:    Patient ID: Sydney Valenzuela, female    DOB: 1980/12/17, 39 y.o.   MRN: [id number]  HPI: Sydney Valenzuela is a 39 y.o. female  Chief Complaint  Patient presents with  . URI    pt states she started feeling better but is now feeling worse. Patient states she is still having a cough, headache, fatigue, and muscle pain    . This visit was completed via WebEx due to the restrictions of the COVID-19 pandemic. All issues as above were discussed and addressed. Physical exam was done as above through visual confirmation on WebEx. If it was felt that the patient should be evaluated in the office, they were directed there. The patient verbally consented to this visit. . Location of the patient: home . Location of the provider: home . Those involved with this call:  . Provider: Merrie Roof, PA-C . CMA: Lesle Chris, [place] . Front Desk/Registration: Keeshia Side  . Time spent on call: 15 minutes with patient face to face via video conference. More than 50% of this time was spent in counseling and coordination of care. 5 minutes total spent in review of patient's record and preparation of their chart. I verified patient identity using two factors (patient name and date of birth). Patient consents verbally to being seen via telemedicine visit today.   Presenting today for URI f/u after nearly completing zpak and prednisone. Started feeling some better over the weekend, then yesterday started having neck and shoulder tenderness and returning headaches, congestion, productive cough, chest tightness, low grade fevers. Denies CP, SOB, vomiting, diarrhea. Was tested at onset of illness and COVID neg.   Relevant past medical, surgical, family and social history reviewed and updated as indicated. Interim medical history since our last visit reviewed. Allergies and medications reviewed and updated.  Review of Systems  Per HPI unless specifically  indicated above     Objective:    There were no vitals taken for this visit.  Wt Readings from Last 3 Encounters:  05/14/19 198 lb (89.8 kg)  05/10/19 197 lb (89.4 kg)  02/15/19 229 lb (103.9 kg)    Physical Exam Vitals and nursing note reviewed.  Constitutional:      General: She is not in acute distress.    Appearance: Normal appearance.  HENT:     Head: Atraumatic.     Right Ear: External ear normal.     Left Ear: External ear normal.     Nose: Congestion present.     Mouth/Throat:     Mouth: Mucous membranes are moist.     Pharynx: Oropharynx is clear. Posterior oropharyngeal erythema present.  Eyes:     Extraocular Movements: Extraocular movements intact.     Conjunctiva/sclera: Conjunctivae normal.  Cardiovascular:     Comments: Unable to assess via virtual visit Pulmonary:     Effort: Pulmonary effort is normal. No respiratory distress.  Musculoskeletal:        General: Normal range of motion.     Cervical back: Normal range of motion.  Skin:    General: Skin is dry.     Findings: No erythema.  Neurological:     Mental Status: She is alert and oriented to person, place, and time.  Psychiatric:        Mood and Affect: Mood normal.        Thought Content: Thought content normal.  Judgment: Judgment normal.     Results for orders placed or performed in visit on 03/11/19  Novel Coronavirus, NAA ([facility])   Specimen: Nasopharyngeal(NP) swabs in vial transport medium   NASOPHARYNGE  TESTING  Result Value Ref Range   SARS-CoV-2, NAA Not Detected Not Detected      Assessment & Plan:   Problem List Items Addressed This Visit    None    Visit Diagnoses    Upper respiratory tract infection, unspecified type    -  Primary   Will start doxycycline and remain out of work for entire week or until several days of being symptom free. Work note provided       Follow up plan: Return if symptoms worsen or fail to improve.

## 2019-05-19 ENCOUNTER — Encounter: Payer: Self-pay | Admitting: Family Medicine

## 2019-05-24 DIAGNOSIS — Z79899 Other long term (current) drug therapy: Secondary | ICD-10-CM | POA: Diagnosis not present

## 2019-05-24 DIAGNOSIS — M791 Myalgia, unspecified site: Secondary | ICD-10-CM | POA: Diagnosis not present

## 2019-05-24 DIAGNOSIS — G43009 Migraine without aura, not intractable, without status migrainosus: Secondary | ICD-10-CM | POA: Diagnosis not present

## 2019-05-24 DIAGNOSIS — E669 Obesity, unspecified: Secondary | ICD-10-CM | POA: Diagnosis not present

## 2019-05-24 DIAGNOSIS — Z20822 Contact with and (suspected) exposure to covid-19: Secondary | ICD-10-CM | POA: Diagnosis not present

## 2019-05-24 DIAGNOSIS — R05 Cough: Secondary | ICD-10-CM | POA: Diagnosis not present

## 2019-05-24 DIAGNOSIS — G43809 Other migraine, not intractable, without status migrainosus: Secondary | ICD-10-CM | POA: Diagnosis not present

## 2019-05-24 DIAGNOSIS — F17210 Nicotine dependence, cigarettes, uncomplicated: Secondary | ICD-10-CM | POA: Diagnosis not present

## 2019-05-24 DIAGNOSIS — N939 Abnormal uterine and vaginal bleeding, unspecified: Secondary | ICD-10-CM | POA: Diagnosis not present

## 2019-05-24 DIAGNOSIS — R87619 Unspecified abnormal cytological findings in specimens from cervix uteri: Secondary | ICD-10-CM | POA: Diagnosis not present

## 2019-05-24 DIAGNOSIS — M419 Scoliosis, unspecified: Secondary | ICD-10-CM | POA: Diagnosis not present

## 2019-05-24 DIAGNOSIS — R42 Dizziness and giddiness: Secondary | ICD-10-CM | POA: Diagnosis not present

## 2019-05-24 DIAGNOSIS — R519 Headache, unspecified: Secondary | ICD-10-CM | POA: Diagnosis not present

## 2019-05-24 DIAGNOSIS — M797 Fibromyalgia: Secondary | ICD-10-CM | POA: Diagnosis not present

## 2019-05-24 DIAGNOSIS — Z7951 Long term (current) use of inhaled steroids: Secondary | ICD-10-CM | POA: Diagnosis not present

## 2019-05-26 ENCOUNTER — Telehealth: Payer: Self-pay | Admitting: Family Medicine

## 2019-05-27 ENCOUNTER — Other Ambulatory Visit: Payer: Self-pay

## 2019-05-27 ENCOUNTER — Encounter: Payer: Self-pay | Admitting: Family Medicine

## 2019-05-27 ENCOUNTER — Ambulatory Visit (INDEPENDENT_AMBULATORY_CARE_PROVIDER_SITE_OTHER): Payer: BC Managed Care – PPO | Admitting: Family Medicine

## 2019-05-27 VITALS — BP 123/79 | HR 82 | Temp 99.3°F | Ht 62.0 in | Wt 195.0 lb

## 2019-05-27 DIAGNOSIS — J301 Allergic rhinitis due to pollen: Secondary | ICD-10-CM | POA: Diagnosis not present

## 2019-05-27 DIAGNOSIS — J0100 Acute maxillary sinusitis, unspecified: Secondary | ICD-10-CM

## 2019-05-27 MED ORDER — MONTELUKAST SODIUM 10 MG PO TABS: 10.0000 mg | ORAL_TABLET | Freq: Every day | ORAL | 1 refills | Status: DC

## 2019-05-27 MED ORDER — PREDNISONE 10 MG PO TABS: ORAL_TABLET | ORAL | 0 refills | Status: DC

## 2019-05-27 NOTE — Progress Notes (Signed)
BP 123/79   Pulse 82   Temp 99.3 F (37.4 C) (Temporal)   Ht 5\' 2"  (1.575 m)   Wt 195 lb (88.5 kg)   SpO2 93%   BMI 35.67 kg/m    Subjective:    Patient ID: Sydney Valenzuela, female    DOB: 05-May-1980, 39 y.o.   MRN: [id number]  HPI: Sydney Valenzuela is a 39 y.o. female  Chief Complaint  Patient presents with  . Chills    x 3 weks ago. negative covid test this past Monday. went to ED on 05/24/19  . Fever  . Nasal Congestion  . Headache  . Diarrhea   Worsening chest congestion and chest tightness, SOB, nausea, facial pressure and ear pain, sinus pain and pressure. Overall feeling better though with the fatigue, fevers, chills. Has now completed zpak, doxycycline and prednisone and has gone to ER and given migraine cocktail with minimal resolution in sxs. CT head and CXR in hospital both benign. Taking benadryl. Has been on singulair in past but states it didn't work for her allergic rhinitis. Gets frequent sinusitis. Does neti pots often at home.   Relevant past medical, surgical, family and social history reviewed and updated as indicated. Interim medical history since our last visit reviewed. Allergies and medications reviewed and updated.  Review of Systems  Per HPI unless specifically indicated above     Objective:    BP 123/79   Pulse 82   Temp 99.3 F (37.4 C) (Temporal)   Ht 5\' 2"  (1.575 m)   Wt 195 lb (88.5 kg)   SpO2 93%   BMI 35.67 kg/m   Wt Readings from Last 3 Encounters:  05/27/19 195 lb (88.5 kg)  05/14/19 198 lb (89.8 kg)  05/10/19 197 lb (89.4 kg)    Physical Exam Vitals and nursing note reviewed.  Constitutional:      General: She is not in acute distress.    Appearance: Normal appearance.  HENT:     Head: Atraumatic.     Right Ear: External ear normal.     Left Ear: External ear normal.     Nose: Congestion present.     Mouth/Throat:     Mouth: Mucous membranes are moist.     Pharynx: Oropharynx is clear. Posterior oropharyngeal erythema  present.  Eyes:     Extraocular Movements: Extraocular movements intact.     Conjunctiva/sclera: Conjunctivae normal.  Cardiovascular:     Comments: Unable to assess via virtual visit Pulmonary:     Effort: Pulmonary effort is normal. No respiratory distress.  Musculoskeletal:        General: Normal range of motion.     Cervical back: Normal range of motion.  Skin:    General: Skin is dry.     Findings: No erythema.  Neurological:     Mental Status: She is alert and oriented to person, place, and time.  Psychiatric:        Mood and Affect: Mood normal.        Thought Content: Thought content normal.        Judgment: Judgment normal.     Results for orders placed or performed in visit on 03/11/19  Novel Coronavirus, NAA ([facility])   Specimen: Nasopharyngeal(NP) swabs in vial transport medium   NASOPHARYNGE  TESTING  Result Value Ref Range   SARS-CoV-2, NAA Not Detected Not Detected      Assessment & Plan:   Problem List Items Addressed This Visit  Respiratory   Allergic rhinitis    Poor control despite consistent use of allergy regimen. Re-start singulair, continue neti pot, mucinex. Refer to ENT given frequency of sinus infections and continued poor allergy control      Relevant Orders   Ambulatory referral to ENT    Other Visit Diagnoses    Acute maxillary sinusitis, recurrence not specified    -  Primary   Completed 2 rounds of abx, will start more prednisone, restart singulair, continue allergy regimen   Relevant Medications   diphenhydrAMINE HCl (BENADRYL ALLERGY PO)   predniSONE (DELTASONE) 10 MG tablet   Other Relevant Orders   Ambulatory referral to ENT       Follow up plan: Return if symptoms worsen or fail to improve.

## 2019-05-27 NOTE — Assessment & Plan Note (Signed)
Poor control despite consistent use of allergy regimen. Re-start singulair, continue neti pot, mucinex. Refer to ENT given frequency of sinus infections and continued poor allergy control

## 2019-05-31 ENCOUNTER — Encounter: Payer: Self-pay | Admitting: Family Medicine

## 2019-06-01 ENCOUNTER — Other Ambulatory Visit: Payer: Self-pay | Admitting: Family Medicine

## 2019-06-01 DIAGNOSIS — R519 Headache, unspecified: Secondary | ICD-10-CM | POA: Diagnosis not present

## 2019-06-01 DIAGNOSIS — J309 Allergic rhinitis, unspecified: Secondary | ICD-10-CM | POA: Diagnosis not present

## 2019-06-10 DIAGNOSIS — F329 Major depressive disorder, single episode, unspecified: Secondary | ICD-10-CM | POA: Diagnosis not present

## 2019-06-10 DIAGNOSIS — R5383 Other fatigue: Secondary | ICD-10-CM | POA: Diagnosis not present

## 2019-06-28 ENCOUNTER — Ambulatory Visit (INDEPENDENT_AMBULATORY_CARE_PROVIDER_SITE_OTHER): Payer: BC Managed Care – PPO | Admitting: Family Medicine

## 2019-06-28 ENCOUNTER — Other Ambulatory Visit: Payer: Self-pay

## 2019-06-28 ENCOUNTER — Encounter: Payer: Self-pay | Admitting: Family Medicine

## 2019-06-28 VITALS — BP 124/83 | HR 73 | Temp 98.0°F | Wt 201.0 lb

## 2019-06-28 DIAGNOSIS — D489 Neoplasm of uncertain behavior, unspecified: Secondary | ICD-10-CM

## 2019-06-28 DIAGNOSIS — F419 Anxiety disorder, unspecified: Secondary | ICD-10-CM | POA: Diagnosis not present

## 2019-06-28 DIAGNOSIS — M797 Fibromyalgia: Secondary | ICD-10-CM

## 2019-06-28 DIAGNOSIS — F322 Major depressive disorder, single episode, severe without psychotic features: Secondary | ICD-10-CM

## 2019-06-28 MED ORDER — PREGABALIN 200 MG PO CAPS: 200.0000 mg | ORAL_CAPSULE | Freq: Two times a day (BID) | ORAL | 2 refills | Status: DC

## 2019-06-28 MED ORDER — METHOCARBAMOL 500 MG PO TABS: 500.0000 mg | ORAL_TABLET | Freq: Three times a day (TID) | ORAL | 1 refills | Status: DC | PRN

## 2019-06-28 NOTE — Progress Notes (Signed)
BP 124/83   Pulse 73   Temp 98 F (36.7 C) (Oral)   Wt 201 lb (91.2 kg)   SpO2 100%   BMI 36.76 kg/m    Subjective:    Patient ID: Sydney Valenzuela, female    DOB: [birth year], 39 y.o.   MRN: [id number]  HPI: Sydney Valenzuela is a 39 y.o. female  Chief Complaint  Patient presents with  . Fibromyalgia    discuss lyrica   Here today to discuss worsening fibromyalgia sxs. Generalized myalgias, chest muscle pain, lack of motivation, sadness. Working with Neurology for further evaluation, including labs and a nerve biopsy. Neurology recommending Rheumatology but pt wanting to hold off for now on that. Seeing Psychiatry soon at [facility] to establish care and discussion regarding her uncontrolled depression, anxiety and PTSD. Does not wish to start any medication until this time. Denies SI/HI.   Hit her back on a metal part of the playground over weekend and halfway ripped a large mole off her back. Bleeding, pain. Also has a place she wants looked at on left shin that's been there for years but maybe getting larger recently.   Relevant past medical, surgical, family and social history reviewed and updated as indicated. Interim medical history since our last visit reviewed. Allergies and medications reviewed and updated.  Review of Systems  Per HPI unless specifically indicated above     Objective:    BP 124/83   Pulse 73   Temp 98 F (36.7 C) (Oral)   Wt 201 lb (91.2 kg)   SpO2 100%   BMI 36.76 kg/m   Wt Readings from Last 3 Encounters:  06/28/19 201 lb (91.2 kg)  05/27/19 195 lb (88.5 kg)  05/14/19 198 lb (89.8 kg)    Physical Exam Vitals and nursing note reviewed.  Constitutional:      Appearance: Normal appearance. She is not ill-appearing.  HENT:     Head: Atraumatic.  Eyes:     Extraocular Movements: Extraocular movements intact.     Conjunctiva/sclera: Conjunctivae normal.  Cardiovascular:     Rate and Rhythm: Normal rate and regular rhythm.     Heart sounds: Normal  heart sounds.  Pulmonary:     Effort: Pulmonary effort is normal.     Breath sounds: Normal breath sounds.  Musculoskeletal:        General: Normal range of motion.     Cervical back: Normal range of motion and neck supple.  Skin:    General: Skin is warm and dry.     Comments: Papular nevus on upper mid-back partially torn off, no active bleeding  Neurological:     Mental Status: She is alert and oriented to person, place, and time.  Psychiatric:        Thought Content: Thought content normal.        Judgment: Judgment normal.     Comments: Tearful at times during interview     Results for orders placed or performed in visit on 03/11/19  Novel Coronavirus, NAA ([facility])   Specimen: Nasopharyngeal(NP) swabs in vial transport medium   NASOPHARYNGE  TESTING  Result Value Ref Range   SARS-CoV-2, NAA Not Detected Not Detected      Assessment & Plan:   Problem List Items Addressed This Visit      Other   Depression, major, single episode, severe ([place])    Poorly controlled but wishes to wait until est care with Psychiatry prior to making med additions  Anxiety    Poorly controlled. Await est care with Psychiatry. Declines medication changes in meantime      Fibromyalgia - Primary    Increase lyrica, continue robaxin prn. Recommended starting some cymbalta or elavil to help with moods and pain but pt wishes to hold off until seeing Psychiatry soon. Continue workup with Neurology additionally      Relevant Medications   pregabalin (LYRICA) 200 MG capsule   methocarbamol (ROBAXIN) 500 MG tablet    Other Visit Diagnoses    Neoplasm of uncertain behavior       Will schedule shave excision with bx for both lesions of concern to patient in near future       Follow up plan: Return in about 2 days (around 06/30/2019) for Shave excision.

## 2019-06-29 NOTE — Assessment & Plan Note (Signed)
Increase lyrica, continue robaxin prn. Recommended starting some cymbalta or elavil to help with moods and pain but pt wishes to hold off until seeing Psychiatry soon. Continue workup with Neurology additionally

## 2019-06-29 NOTE — Assessment & Plan Note (Signed)
Poorly controlled. Await est care with Psychiatry. Declines medication changes in meantime

## 2019-06-29 NOTE — Assessment & Plan Note (Signed)
Poorly controlled but wishes to wait until est care with Psychiatry prior to making med additions

## 2019-06-30 ENCOUNTER — Ambulatory Visit (INDEPENDENT_AMBULATORY_CARE_PROVIDER_SITE_OTHER): Payer: BC Managed Care – PPO | Admitting: Family Medicine

## 2019-06-30 ENCOUNTER — Other Ambulatory Visit: Payer: Self-pay

## 2019-06-30 ENCOUNTER — Encounter: Payer: Self-pay | Admitting: Family Medicine

## 2019-06-30 VITALS — BP 119/75 | HR 98 | Temp 98.8°F | Wt 201.0 lb

## 2019-06-30 DIAGNOSIS — D489 Neoplasm of uncertain behavior, unspecified: Secondary | ICD-10-CM

## 2019-06-30 DIAGNOSIS — R5383 Other fatigue: Secondary | ICD-10-CM | POA: Diagnosis not present

## 2019-07-01 ENCOUNTER — Other Ambulatory Visit (HOSPITAL_COMMUNITY)
Admission: RE | Admit: 2019-07-01 | Discharge: 2019-07-01 | Disposition: A | Discharge status: Home / Self Care | Payer: Medicaid Other | Source: Ambulatory Visit | Attending: Family Medicine | Admitting: Family Medicine

## 2019-07-01 DIAGNOSIS — D489 Neoplasm of uncertain behavior, unspecified: Secondary | ICD-10-CM | POA: Insufficient documentation

## 2019-07-01 NOTE — Progress Notes (Signed)
BP 119/75   Pulse 98   Temp 98.8 F (37.1 C) (Oral)   Wt 201 lb (91.2 kg)   SpO2 93%   BMI 36.76 kg/m    Subjective:    Patient ID: Sydney Valenzuela, female    DOB: 1981/03/20, 39 y.o.   MRN: [id number]  HPI: Sydney Valenzuela is a 39 y.o. female  Chief Complaint  Patient presents with  . Nevus removal   Presenting today requesting removal of 3 skin lesions that she's had for many years but states they're getting caught on clothing and bleeding, hurting often. 2 appear like soft raised moles, one on right axilla and the other in mid back area. Another area is on left shin. Has never had these areas checked out before. Denies personal hx of skin cancers or precancers but has had fhx of these.   Relevant past medical, surgical, family and social history reviewed and updated as indicated. Interim medical history since our last visit reviewed. Allergies and medications reviewed and updated.  Review of Systems  Per HPI unless specifically indicated above     Objective:    BP 119/75   Pulse 98   Temp 98.8 F (37.1 C) (Oral)   Wt 201 lb (91.2 kg)   SpO2 93%   BMI 36.76 kg/m   Wt Readings from Last 3 Encounters:  06/30/19 201 lb (91.2 kg)  06/28/19 201 lb (91.2 kg)  05/27/19 195 lb (88.5 kg)    Physical Exam Vitals and nursing note reviewed.  Constitutional:      Appearance: Normal appearance. She is not ill-appearing.  HENT:     Head: Atraumatic.  Eyes:     Extraocular Movements: Extraocular movements intact.     Conjunctiva/sclera: Conjunctivae normal.  Cardiovascular:     Rate and Rhythm: Normal rate and regular rhythm.     Heart sounds: Normal heart sounds.  Pulmonary:     Effort: Pulmonary effort is normal.     Breath sounds: Normal breath sounds.  Musculoskeletal:        General: Normal range of motion.     Cervical back: Normal range of motion and neck supple.  Skin:    General: Skin is warm and dry.     Comments: Raised, fleshy skin lesions mid back and  right axilla Firm papule present left shin  Neurological:     Mental Status: She is alert and oriented to person, place, and time.  Psychiatric:        Mood and Affect: Mood normal.        Thought Content: Thought content normal.        Judgment: Judgment normal.    Procedure: Shave biopsy and excision of 3 neoplasms - axilla, left shin, and mid back Areas all prepped with alcohol pad and infiltrated with about 2 cc 1% lidocaine with epi each. Areas then sterilized with betadine and dermablade used to excise each lesion at base. Silver nitrate sticks and gauze with pressure used to coagulate each area. Each dressed with triple antibiotic ointment, nonstick gauze and patch bandage. Procedure very well tolerated with no immediate complications. Home wound care reviewed at length.   Results for orders placed or performed in visit on 06/30/19  Surgical pathology  Result Value Ref Range   SURGICAL PATHOLOGY      SURGICAL PATHOLOGY CASE: [id number] PATIENT: Milly Lonia Skinner Surgical Pathology Report     Clinical History: neoplasm of uncertain behavior - mid back, right axilla, neoplasm left lower  leg, present for 20+ years (cm)     FINAL MICROSCOPIC DIAGNOSIS:  A. SKIN, RIGHT AXILLA, EXCISION: - Melanocytic nevus. - No significant cytologic atypia is identified.  B. SKIN, MID BACK, EXCISION: - Melanocytic nevus. - No significant cytologic atypia is identified.  C. SKIN, LEFT LOWER LEG/SHIN, EXCISION: - Dermatofibroma. - See comment.  COMMENT:  C. Dr. Nira Retort of dermatopathology has reviewed part C and concurs with this interpretation.   Valenzuela DESCRIPTION:  A: Received in formalin is a 1.1 x 0.7 x 0.6 cm soft pale brown skin nodule.  The specimen is inked, sectioned and entirely submitted in 1 cassette.  B: Received in formalin is a 0.6 x 0.6 x 0.5 cm soft pale brown papule. The specimen is inked, bisected and entirely submitted in 1 cassette.  C: Received  in  formalin is a 0.7 x 0.5 x 0.1 cm skin shave.  The specimen is inked, sectioned and entirely submitted in 1 cassette.  [facility] 07/01/2019)   Final Diagnosis performed by Enid Cutter, MD.   Electronically signed 07/05/2019 Technical and / or Professional components performed at [facility]. [facility], [place] [address] [place], [place] [address].  Immunohistochemistry Technical component (if applicable) was performed at [facility]. [address] , [place], [place], [place] [address].   IMMUNOHISTOCHEMISTRY DISCLAIMER (if applicable): Some of these immunohistochemical stains may have been developed and the performance characteristics determine by [facility]. Some may not have been cleared or approved by the U.S. Food and Drug Administration. The FDA has determined that such clearance or approval is not necessary. This test is used for clinical purposes. It should not be regarded as investigational or for research. This laboratory is ce rtified under the Clinical Laboratory Improvement Amendments of 1988 (CLIA-88) as qualified to perform high complexity clinical laboratory testing.  The controls stained appropriately.       Assessment & Plan:   Problem List Items Addressed This Visit    None    Visit Diagnoses    Neoplasm of uncertain behavior    -  Primary   Shave excision/bx performed on all 3 lesions, await results. Home care reviewed. Procedure well tolerated   Relevant Orders   Surgical pathology (Completed)   Surgical pathology   Surgical pathology       Follow up plan: Return if symptoms worsen or fail to improve.

## 2019-07-05 LAB — SURGICAL PATHOLOGY

## 2019-07-12 ENCOUNTER — Encounter: Payer: Self-pay | Admitting: Family Medicine

## 2019-07-14 DIAGNOSIS — R5382 Chronic fatigue, unspecified: Secondary | ICD-10-CM | POA: Diagnosis not present

## 2019-07-20 ENCOUNTER — Encounter: Payer: Self-pay | Admitting: Family Medicine

## 2019-09-17 ENCOUNTER — Other Ambulatory Visit: Payer: Self-pay | Admitting: Family Medicine

## 2019-09-20 NOTE — Telephone Encounter (Signed)
Routing to provider  

## 2019-11-01 ENCOUNTER — Encounter: Payer: Self-pay | Admitting: Family Medicine

## 2020-03-13 ENCOUNTER — Telehealth: Payer: Self-pay | Admitting: Unknown Physician Specialty

## 2020-03-14 ENCOUNTER — Other Ambulatory Visit: Payer: Self-pay | Admitting: Nurse Practitioner

## 2020-03-14 ENCOUNTER — Encounter: Payer: Self-pay | Admitting: Nurse Practitioner

## 2020-03-14 ENCOUNTER — Other Ambulatory Visit: Payer: Self-pay

## 2020-03-14 ENCOUNTER — Telehealth (INDEPENDENT_AMBULATORY_CARE_PROVIDER_SITE_OTHER): Payer: Self-pay | Admitting: Nurse Practitioner

## 2020-03-14 DIAGNOSIS — J329 Chronic sinusitis, unspecified: Secondary | ICD-10-CM | POA: Insufficient documentation

## 2020-03-14 DIAGNOSIS — J0110 Acute frontal sinusitis, unspecified: Secondary | ICD-10-CM

## 2020-03-14 MED ORDER — PANTOPRAZOLE SODIUM 40 MG PO TBEC: 40.0000 mg | DELAYED_RELEASE_TABLET | Freq: Two times a day (BID) | ORAL | 3 refills | Status: DC

## 2020-03-14 MED ORDER — PREDNISONE 10 MG PO TABS: ORAL_TABLET | ORAL | 0 refills | Status: DC

## 2020-03-14 MED ORDER — AMOXICILLIN-POT CLAVULANATE 875-125 MG PO TABS: 1.0000 | ORAL_TABLET | Freq: Two times a day (BID) | ORAL | 0 refills | Status: AC

## 2020-03-14 NOTE — Assessment & Plan Note (Signed)
Acute x 4 weeks with waxing and waning.  Negative Covid testing.  Prone to sinus infections this time of year and does well with abx and longer steroid taper.  At this time recommend she continue to self quarantine until symptoms improve.  Retest for Covid if ongoing.  Will send in scripts for Prednisone taper and Augmentin for treatment.  Recommend: - Increased rest - Increasing Fluids - Acetaminophen / ibuprofen as needed for fever/pain.  - Mucinex.  - Saline sinus flushes or a neti pot - Humidifying the air Return to office for worsening or ongoing symptoms.

## 2020-03-14 NOTE — Progress Notes (Signed)
There were no vitals taken for this visit.   Subjective:    Patient ID: Sydney Valenzuela, female    DOB: 12/13/1980, 39 y.o.   MRN: [id number]  HPI: Sydney Valenzuela is a 39 y.o. female  Chief Complaint  Patient presents with  . Sinusitis  . Sore Throat  . Cough  . Headache  . Diarrhea  . chest congestion  . heavy feeling in lungs  . Nausea  . Ear Pain    Left ear has stabbing pain, cant hear,  R ear dull achy pain but muffled sounds, and popping    . This visit was completed via telephone due to the restrictions of the COVID-19 pandemic. All issues as above were discussed and addressed but no physical exam was performed. If it was felt that the patient should be evaluated in the office, they were directed there. The patient verbally consented to this visit. Patient was unable to complete an audio/visual visit due to Technical difficulties,Lack of internet. Due to the catastrophic nature of the COVID-19 pandemic, this visit was done through audio contact only. . Location of the patient: home . Location of the provider: work . Those involved with this call:  . Provider: Marnee Guarneri, DNP . CMA: Yvonna Alanis, CMA . Front Desk/Registration: Seanne Side  . Time spent on call: 20 minutes on the phone discussing health concerns. 15 minutes total spent in review of patient's record and preparation of their chart.  . I verified patient identity using two factors (patient name and date of birth). Patient consents verbally to being seen via telemedicine visit today.   UPPER RESPIRATORY TRACT INFECTION Presents today for symptoms that started 4 weeks ago, has been caring for her 4 grandchildren who had a "viral infection".  Reports she has a "lower immune system".  She started taking medication and supplements at home, she got better and then got worse.  This has been waxing and waning.  Last week started to get worse.  On Monday the right side of her face started to hurt really bad  -- has "massive headache" and whole right side of her face is tender to touch.  Does have underlying chronic fatigue syndrome.  Has not been Covid vaccinated.  Denies loss of taste or smell.  Has had negative Covid testing on 03/02/20 when initial symptoms started.   Fever: occasional Cough: yes Shortness of breath: no Wheezing: no Chest pain: no Chest tightness: yes Chest congestion: yes Nasal congestion: yes Runny nose: yes Post nasal drip: yes Sneezing: no Sore throat: no Swollen glands: no Sinus pressure: yes Headache: yes Face pain: yes Toothache: to both sides Ear pain: yes "right Ear pressure: yes "right Eyes red/itching:no Eye drainage/crusting: no  Vomiting: no Rash: no Fatigue: yes Sick contacts: yes Strep contacts: no  Context: fluctuating Recurrent sinusitis: no Relief with OTC cold/cough medications: a little bit  Treatments attempted: cold/sinus   Relevant past medical, surgical, family and social history reviewed and updated as indicated. Interim medical history since our last visit reviewed. Allergies and medications reviewed and updated.  Review of Systems  Constitutional: Positive for fatigue and fever. Negative for activity change and appetite change.  HENT: Positive for congestion, ear pain, facial swelling, postnasal drip, rhinorrhea, sinus pressure and sinus pain. Negative for ear discharge, sneezing, sore throat and voice change.   Eyes: Negative for pain and visual disturbance.  Respiratory: Positive for cough and chest tightness. Negative for shortness of breath and wheezing.   Cardiovascular:  Negative for chest pain, palpitations and leg swelling.  Gastrointestinal: Negative.   Endocrine: Negative.   Musculoskeletal: Positive for myalgias.  Neurological: Positive for headaches.  Psychiatric/Behavioral: Negative.     Per HPI unless specifically indicated above     Objective:    There were no vitals taken for this visit.  Wt Readings from  Last 3 Encounters:  06/30/19 201 lb (91.2 kg)  06/28/19 201 lb (91.2 kg)  05/27/19 195 lb (88.5 kg)    Physical Exam   Unable to assess due to patient technical difficulties -- no video.  Reports tenderness to frontal sinuses.  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 SKIN, RIGHT AXILLA, EXCISION: - Melanocytic nevus. - No significant cytologic atypia is identified.  B. SKIN, MID BACK, EXCISION: - Melanocytic nevus. - No significant cytologic atypia is identified.  C. SKIN, LEFT LOWER LEG/SHIN, EXCISION: - Dermatofibroma. - See comment.  COMMENT:  C. Dr. Nira Retort of dermatopathology has reviewed part C and concurs with this interpretation.   Valenzuela DESCRIPTION:  A: Received in formalin is a 1.1 x 0.7 x 0.6 cm soft pale brown skin nodule.  The specimen is inked, sectioned and entirely submitted in 1 cassette.  B: Received in formalin is a 0.6 x 0.6 x 0.5 cm soft pale brown papule. The specimen is inked, bisected and entirely submitted in 1 cassette.  C: Received  in formalin is a 0.7 x 0.5 x 0.1 cm skin shave.  The specimen is inked, sectioned and entirely submitted in 1 cassette.  [facility] 07/01/2019)   Final Diagnosis performed by Enid Cutter, MD.   Electronically signed 07/05/2019 Technical and / or Professional components performed at [facility]. [facility], [place] [address] [place], [place] [address].  Immunohistochemistry Technical component (if applicable) was performed at [facility]. [address] [place], [place], [place] [address].   IMMUNOHISTOCHEMISTRY DISCLAIMER (if applicable): Some  of these immunohistochemical stains may have been developed and the performance characteristics determine by [facility]. Some may not have been cleared or approved by the U.S. Food and Drug Administration. The FDA has determined that such clearance or approval is not necessary. This test is used for clinical purposes. It should not be regarded as investigational or for research. This laboratory is ce rtified under the Clinical Laboratory Improvement Amendments of 1988 (CLIA-88) as qualified to perform high complexity clinical laboratory testing.  The controls stained appropriately.       Assessment & Plan:   Problem List Items Addressed This Visit      Respiratory   Sinusitis    Acute x 4 weeks with waxing and waning.  Negative Covid testing.  Prone to sinus infections this time of year and does well with abx and longer steroid taper.  At this time recommend she continue to self quarantine until symptoms improve.  Retest for Covid if ongoing.  Will send in scripts for Prednisone taper and Augmentin for treatment.  Recommend: - Increased rest - Increasing Fluids - Acetaminophen / ibuprofen as needed for fever/pain.  - Mucinex.  - Saline sinus flushes or a neti pot - Humidifying  the air Return to office for worsening or ongoing symptoms.      Relevant Medications   amoxicillin-clavulanate (AUGMENTIN) 875-125 MG tablet   predniSONE (DELTASONE) 10 MG tablet      I discussed the assessment and treatment plan with the patient. The patient was provided an opportunity to ask questions and all were answered. The patient agreed with the plan and demonstrated an understanding of the instructions.   The patient was advised to call back or seek an in-person evaluation if the symptoms worsen or if the condition fails to improve as anticipated.   I provided 21+ minutes of time during this encounter.  Follow up plan: Return if symptoms worsen or fail to improve.

## 2020-03-14 NOTE — Patient Instructions (Signed)

## 2020-06-30 ENCOUNTER — Encounter: Payer: Self-pay | Admitting: Nurse Practitioner

## 2020-06-30 ENCOUNTER — Ambulatory Visit (INDEPENDENT_AMBULATORY_CARE_PROVIDER_SITE_OTHER): Payer: Self-pay | Admitting: Nurse Practitioner

## 2020-06-30 ENCOUNTER — Ambulatory Visit: Payer: Self-pay

## 2020-06-30 DIAGNOSIS — R0602 Shortness of breath: Secondary | ICD-10-CM

## 2020-06-30 DIAGNOSIS — J069 Acute upper respiratory infection, unspecified: Secondary | ICD-10-CM

## 2020-06-30 MED ORDER — METHYLPREDNISOLONE 4 MG PO TBPK: ORAL_TABLET | ORAL | 0 refills | Status: DC

## 2020-06-30 MED ORDER — AMOXICILLIN-POT CLAVULANATE 875-125 MG PO TABS: 1.0000 | ORAL_TABLET | Freq: Two times a day (BID) | ORAL | 0 refills | Status: DC

## 2020-06-30 NOTE — Telephone Encounter (Addendum)
Pt. Reports started with cough with yellow-green mucus this week.Cough kept her up last night.Mild shortness of breath. Has not checked temp but had sweats during the night. Concerned about pneumonia. Virtual appointment made.  Reason for Disposition . [1] MILD difficulty breathing (e.g., minimal/no SOB at rest, SOB with walking, pulse <100) AND [2] still present when not coughing  Answer Assessment - Initial Assessment Questions 1. ONSET: "When did the cough begin?"      This week 2. SEVERITY: "How bad is the cough today?"      Severe 3. SPUTUM: "Describe the color of your sputum" (none, dry cough; clear, white, yellow, green)     Yellow green 4. HEMOPTYSIS: "Are you coughing up any blood?" If so ask: "How much?" (flecks, streaks, tablespoons, etc.)     No 5. DIFFICULTY BREATHING: "Are you having difficulty breathing?" If Yes, ask: "How bad is it?" (e.g., mild, moderate, severe)    - MILD: No SOB at rest, mild SOB with walking, speaks normally in sentences, can lay down, no retractions, pulse < 100.    - MODERATE: SOB at rest, SOB with minimal exertion and prefers to sit, cannot lie down flat, speaks in phrases, mild retractions, audible wheezing, pulse 100-120.    - SEVERE: Very SOB at rest, speaks in single words, struggling to breathe, sitting hunched forward, retractions, pulse > 120      Mild 6. FEVER: "Do you have a fever?" If Yes, ask: "What is your temperature, how was it measured, and when did it start?"     Unsure 7. CARDIAC HISTORY: "Do you have any history of heart disease?" (e.g., heart attack, congestive heart failure)      No 8. LUNG HISTORY: "Do you have any history of lung disease?"  (e.g., pulmonary embolus, asthma, emphysema)     No 9. PE RISK FACTORS: "Do you have a history of blood clots?" (or: recent major surgery, recent prolonged travel, bedridden)     No 10. OTHER SYMPTOMS: "Do you have any other symptoms?" (e.g., runny nose, wheezing, chest pain)       Chest  full, wheezing 11. PREGNANCY: "Is there any chance you are pregnant?" "When was your last menstrual period?"       No 12. TRAVEL: "Have you traveled out of the country in the last month?" (e.g., travel history, exposures)       No  Protocols used: [place]

## 2020-06-30 NOTE — Progress Notes (Signed)
There were no vitals taken for this visit.   Subjective:    Patient ID: Sydney Valenzuela, female    DOB: 02-01-1981, 40 y.o.   MRN: [id number]  HPI: Sydney Valenzuela is a 40 y.o. female  Chief Complaint  Patient presents with  . Cough    Patient states she has been coughing for about 3 days. Had sore throat on Sunday. Has right ear pain.    UPPER RESPIRATORY TRACT INFECTION Worst symptom: cough x 3 days.  Patient thought it started as allergies and then on Tuesday evening her symptoms worsened.  At home COVID test was negative.  Fever: no Cough: yes Shortness of breath: yes Wheezing: Occasional Chest pain: yes, with cough Chest tightness: yes Chest congestion: yes Nasal congestion: yes Runny nose: no Post nasal drip: no Sneezing: no Sore throat: yes Swollen glands: yes Sinus pressure: yes Headache: yes Face pain: no Toothache: no Ear pain: yes "right Ear pressure: yes bilateral Eyes red/itching:no Eye drainage/crusting: no  Vomiting: no nausea and upset stomach when it first started. Rash: no Fatigue: yes Sick contacts: yes Strep contacts: no  Context: worse Recurrent sinusitis: no Relief with OTC cold/cough medications: no  Treatments attempted: cough syrup   Relevant past medical, surgical, family and social history reviewed and updated as indicated. Interim medical history since our last visit reviewed. Allergies and medications reviewed and updated.  Review of Systems  Constitutional: Positive for fatigue. Negative for fever.  HENT: Positive for congestion, ear pain and sinus pressure. Negative for dental problem, postnasal drip, rhinorrhea, sinus pain, sneezing and sore throat.   Respiratory: Positive for cough, shortness of breath and wheezing.   Cardiovascular: Positive for chest pain (with cough).  Gastrointestinal: Negative for vomiting.  Skin: Negative for rash.  Neurological: Positive for headaches.    Per HPI unless specifically indicated  above     Objective:    There were no vitals taken for this visit.  Wt Readings from Last 3 Encounters:  06/30/19 201 lb (91.2 kg)  06/28/19 201 lb (91.2 kg)  05/27/19 195 lb (88.5 kg)    Physical Exam Vitals and nursing note reviewed.  HENT:     Head: Normocephalic.     Right Ear: Hearing normal.     Left Ear: Hearing normal.     Nose: Nose normal.  Eyes:     Pupils: Pupils are equal, round, and reactive to light.  Pulmonary:     Effort: Pulmonary effort is normal. No respiratory distress.  Neurological:     Mental Status: She is alert.  Psychiatric:        Mood and Affect: Mood normal.        Behavior: Behavior normal.        Thought Content: Thought content normal.        Judgment: Judgment normal.        Assessment & Plan:   Problem List Items Addressed This Visit   None   Visit Diagnoses    Shortness of breath    -  Primary   Chest xray ordered to rule out pneumonia. Will make further recommendations based on imaging results.    Relevant Orders   DG Chest 2 View   Upper respiratory tract infection, unspecified type       Complete course of antibiotics and steroids.  Return to clinic if symptoms worsen.     Relevant Medications   methylPREDNISolone (MEDROL DOSEPAK) 4 MG TBPK tablet   amoxicillin-clavulanate (AUGMENTIN) 875-125 MG  tablet       Follow up plan: No follow-ups on file.       This visit was completed via MyChart due to the restrictions of the COVID-19 pandemic. All issues as above were discussed and addressed. Physical exam was done as above through visual confirmation on MyChart. If it was felt that the patient should be evaluated in the office, they were directed there. The patient verbally consented to this visit. 1. Location of the patient: Home 2. Location of the provider: Office 3. Those involved with this call:  ? Provider: Jon Billings, NP ? CMA: Tiffany Reel, CMA ? Front Desk/Registration: Gwendolen Side 4. Time spent on  call: 15 minutes with patient face to face via video conference. More than 50% of this time was spent in counseling and coordination of care. 20 minutes total spent in review of patient's record and preparation of their chart.

## 2020-09-26 ENCOUNTER — Telehealth (INDEPENDENT_AMBULATORY_CARE_PROVIDER_SITE_OTHER): Payer: Self-pay | Admitting: Family Medicine

## 2020-09-26 ENCOUNTER — Encounter: Payer: Self-pay | Admitting: Family Medicine

## 2020-09-26 ENCOUNTER — Other Ambulatory Visit: Payer: Self-pay

## 2020-09-26 DIAGNOSIS — J0100 Acute maxillary sinusitis, unspecified: Secondary | ICD-10-CM

## 2020-09-26 MED ORDER — AMOXICILLIN-POT CLAVULANATE 875-125 MG PO TABS: 1.0000 | ORAL_TABLET | Freq: Two times a day (BID) | ORAL | 0 refills | Status: DC

## 2020-09-26 MED ORDER — PREDNISONE 10 MG PO TABS: ORAL_TABLET | ORAL | 0 refills | Status: DC

## 2020-09-26 NOTE — Assessment & Plan Note (Signed)
Will treat with prednisone and augmentin. Call with any concerns or not getting better. Continue to monitor.

## 2020-09-26 NOTE — Progress Notes (Signed)
There were no vitals taken for this visit.   Subjective:    Patient ID: Sydney Valenzuela, female    DOB: 07/26/1980, 40 y.o.   MRN: [id number]  HPI: Sydney Valenzuela is a 40 y.o. female  Chief Complaint  Patient presents with   Cough    For past week, has been productive with a grayish/green phlegm,    Sore Throat    Hurts to touch neck on the outside   UPPER RESPIRATORY TRACT INFECTION Duration: 9 days Worst symptom: coughing, sore throat Fever: no Cough:  yes Shortness of breath: no Wheezing: yes Chest pain: no Chest tightness: no Chest congestion: no Nasal congestion: yes Runny nose: yes Post nasal drip: yes Sneezing: no Sore throat: yes Swollen glands: yes Sinus pressure: yes Headache: no Face pain: yes Toothache: no Ear pain: no  Ear pressure: no  Eyes red/itching:no Eye drainage/crusting: no  Vomiting: no Rash: no Fatigue: yes Sick contacts: yes Strep contacts: no  Context: worse Recurrent sinusitis: no Relief with OTC cold/cough medications: no  Treatments attempted: cold/sinus and mucinex   Relevant past medical, surgical, family and social history reviewed and updated as indicated. Interim medical history since our last visit reviewed. Allergies and medications reviewed and updated.  Review of Systems  Constitutional:  Positive for fatigue. Negative for activity change, appetite change, chills, diaphoresis, fever and unexpected weight change.  HENT:  Positive for congestion, postnasal drip, rhinorrhea, sinus pressure and sinus pain. Negative for dental problem, drooling, ear discharge, ear pain, facial swelling, hearing loss, mouth sores, nosebleeds, sneezing, sore throat, tinnitus, trouble swallowing and voice change.   Respiratory:  Positive for cough and wheezing. Negative for apnea, choking, chest tightness, shortness of breath and stridor.   Cardiovascular: Negative.   Gastrointestinal: Negative.   Musculoskeletal:  Positive for arthralgias and  myalgias. Negative for back pain, gait problem, joint swelling, neck pain and neck stiffness.  Skin: Negative.   Psychiatric/Behavioral: Negative.     Per HPI unless specifically indicated above     Objective:    There were no vitals taken for this visit.  Wt Readings from Last 3 Encounters:  06/30/19 201 lb (91.2 kg)  06/28/19 201 lb (91.2 kg)  05/27/19 195 lb (88.5 kg)    Physical Exam Vitals and nursing note reviewed.  Constitutional:      General: She is not in acute distress.    Appearance: Normal appearance. She is not ill-appearing, toxic-appearing or diaphoretic.  HENT:     Head: Normocephalic and atraumatic.     Right Ear: External ear normal.     Left Ear: External ear normal.     Nose: Nose normal.     Mouth/Throat:     Mouth: Mucous membranes are moist.     Pharynx: Oropharynx is clear.  Eyes:     General: No scleral icterus.       Right eye: No discharge.        Left eye: No discharge.     Conjunctiva/sclera: Conjunctivae normal.     Pupils: Pupils are equal, round, and reactive to light.  Pulmonary:     Effort: Pulmonary effort is normal. No respiratory distress.     Comments: Speaking in full sentences Musculoskeletal:        General: Normal range of motion.     Cervical back: Normal range of motion.  Skin:    Coloration: Skin is not jaundiced or pale.     Findings: No bruising, erythema, lesion or rash.  Neurological:     Mental Status: She is alert and oriented to person, place, and time. Mental status is at baseline.  Psychiatric:        Mood and Affect: Mood normal.        Behavior: Behavior normal.        Thought Content: Thought content normal.        Judgment: Judgment normal.    Results for orders placed or performed in visit on 06/30/19  Surgical pathology  Result Value Ref Range   SURGICAL PATHOLOGY      SURGICAL PATHOLOGY CASE: [id number] PATIENT: Carine Lonia Skinner Surgical Pathology Report     Clinical History: neoplasm of  uncertain behavior - mid back, right axilla, neoplasm left lower leg, present for 20+ years (cm)     FINAL MICROSCOPIC DIAGNOSIS:  A. SKIN, RIGHT AXILLA, EXCISION: - Melanocytic nevus. - No significant cytologic atypia is identified.  B. SKIN, MID BACK, EXCISION: - Melanocytic nevus. - No significant cytologic atypia is identified.  C. SKIN, LEFT LOWER LEG/SHIN, EXCISION: - Dermatofibroma. - See comment.  COMMENT:  C. Dr. Nira Retort of dermatopathology has reviewed part C and concurs with this interpretation.   Valenzuela DESCRIPTION:  A: Received in formalin is a 1.1 x 0.7 x 0.6 cm soft pale brown skin nodule.  The specimen is inked, sectioned and entirely submitted in 1 cassette.  B: Received in formalin is a 0.6 x 0.6 x 0.5 cm soft pale brown papule. The specimen is inked, bisected and entirely submitted in 1 cassette.  C: Received  in formalin is a 0.7 x 0.5 x 0.1 cm skin shave.  The specimen is inked, sectioned and entirely submitted in 1 cassette.  [facility] 07/01/2019)   Final Diagnosis performed by Enid Cutter, MD.   Electronically signed 07/05/2019 Technical and / or Professional components performed at [facility]. [facility], [place] [address], [place], [place] [address].  Immunohistochemistry Technical component (if applicable) was performed at [facility]. [address], [place], [place], [place] [address].   IMMUNOHISTOCHEMISTRY DISCLAIMER (if applicable): Some of these immunohistochemical stains may have been developed and the performance characteristics determine by [facility]. Some may not have been cleared or approved by the U.S. Food and Drug Administration. The FDA has determined that such clearance or approval is not necessary. This test is used for clinical purposes. It should not be regarded as investigational or for research. This laboratory is ce rtified under the Clinical Laboratory Improvement Amendments of  1988 (CLIA-88) as qualified to perform high complexity clinical laboratory testing.  The controls stained appropriately.       Assessment & Plan:   Problem List Items Addressed This Visit       Respiratory   RESOLVED: Sinusitis - Primary    Will treat with prednisone and augmentin. Call with any concerns or not getting better. Continue to monitor.       Relevant Medications   predniSONE (DELTASONE) 10 MG tablet   amoxicillin-clavulanate (AUGMENTIN) 875-125 MG tablet     Follow up plan: Return if symptoms worsen or fail to improve.   This visit was completed via video visit through MyChart due to the restrictions of the COVID-19 pandemic. All issues as above were discussed and addressed. Physical exam was done as above through visual confirmation on video through MyChart. If it was felt that the patient should be evaluated in the office, they were directed there. The patient verbally consented to this visit. Location of the  patient: home Location of the provider: work Those involved with this call:  Provider: Park Liter, DO CMA: Frazier Butt, [place] Llorens Torres Desk/Registration: Dalisha Side  Time spent on call:  15 minutes with patient face to face via video conference. More than 50% of this time was spent in counseling and coordination of care. 23 minutes total spent in review of patient's record and preparation of their chart.

## 2020-10-12 ENCOUNTER — Encounter: Payer: Self-pay | Admitting: Family Medicine

## 2020-10-17 NOTE — Telephone Encounter (Signed)
Appt please

## 2020-10-19 ENCOUNTER — Encounter: Payer: Self-pay | Admitting: Family Medicine

## 2020-10-19 ENCOUNTER — Ambulatory Visit (INDEPENDENT_AMBULATORY_CARE_PROVIDER_SITE_OTHER): Payer: Self-pay | Admitting: Family Medicine

## 2020-10-19 ENCOUNTER — Other Ambulatory Visit: Payer: Self-pay

## 2020-10-19 VITALS — BP 130/80 | HR 76 | Temp 98.5°F | Wt 216.4 lb

## 2020-10-19 DIAGNOSIS — F4310 Post-traumatic stress disorder, unspecified: Secondary | ICD-10-CM

## 2020-10-19 DIAGNOSIS — F322 Major depressive disorder, single episode, severe without psychotic features: Secondary | ICD-10-CM

## 2020-10-19 DIAGNOSIS — F419 Anxiety disorder, unspecified: Secondary | ICD-10-CM

## 2020-10-19 DIAGNOSIS — M546 Pain in thoracic spine: Secondary | ICD-10-CM

## 2020-10-19 DIAGNOSIS — M797 Fibromyalgia: Secondary | ICD-10-CM

## 2020-10-19 MED ORDER — TIZANIDINE HCL 4 MG PO TABS: 4.0000 mg | ORAL_TABLET | Freq: Four times a day (QID) | ORAL | 1 refills | Status: DC | PRN

## 2020-10-19 MED ORDER — METHOCARBAMOL 500 MG PO TABS: 500.0000 mg | ORAL_TABLET | Freq: Three times a day (TID) | ORAL | 3 refills | Status: DC | PRN

## 2020-10-19 MED ORDER — PREGABALIN 100 MG PO CAPS: 100.0000 mg | ORAL_CAPSULE | Freq: Three times a day (TID) | ORAL | 3 refills | Status: DC

## 2020-10-19 NOTE — Assessment & Plan Note (Signed)
Following with psychiatry. Stable. Continue to monitor.

## 2020-10-19 NOTE — Progress Notes (Signed)
BP 130/80   Pulse 76   Temp 98.5 F (36.9 C) (Oral)   Wt 216 lb 6.4 oz (98.2 kg)   SpO2 98%   BMI 39.58 kg/m    Subjective:    Patient ID: Sydney Valenzuela, female    DOB: April 17, 1980, 40 y.o.   MRN: [id number]  HPI: Sydney Valenzuela is a 40 y.o. female  Chief Complaint  Patient presents with   Pain    Pt states she felt something pop in her upper back Monday when trying to keep her grandfather from falling. States she has been feeling worsening pain and burning since then.    Fibromyalgia    Pt states she is wanting to discuss [place] taking over her Lyrica, and possibly changing regimens    FIBROMYALGIA- feels like she is running in circles with the lyrica, would like to find a balance with the pain and not having the brain fog Pain status: controlled Satisfied with current treatment?: no Medication side effects: yes Medication compliance: excellent compliance Duration: chronic Location: widespread Quality: aching and sore Current pain level: moderate Previous pain level: severe Aggravating factors: certain activities, work Alleviating factors: rest, ice, heat, laying, NSAIDs, APAP, and muscle relaxer, lyrica Previous pain specialty evaluation: yes Non-narcotic analgesic meds: yes Narcotic contract:no Treatments attempted: rest, ice, heat, APAP, ibuprofen, aleve, and HEP   Relevant past medical, surgical, family and social history reviewed and updated as indicated. Interim medical history since our last visit reviewed. Allergies and medications reviewed and updated.  Review of Systems  Constitutional:  Positive for fatigue. Negative for activity change, appetite change, chills, diaphoresis, fever and unexpected weight change.  Respiratory: Negative.    Gastrointestinal: Negative.   Musculoskeletal:  Positive for arthralgias, back pain, myalgias and neck pain. Negative for gait problem, joint swelling and neck stiffness.  Skin: Negative.   Neurological:  Positive for  weakness. Negative for dizziness, tremors, seizures, syncope, facial asymmetry, speech difficulty, light-headedness, numbness and headaches.  Psychiatric/Behavioral: Negative.     Per HPI unless specifically indicated above     Objective:    BP 130/80   Pulse 76   Temp 98.5 F (36.9 C) (Oral)   Wt 216 lb 6.4 oz (98.2 kg)   SpO2 98%   BMI 39.58 kg/m   Wt Readings from Last 3 Encounters:  10/19/20 216 lb 6.4 oz (98.2 kg)  06/30/19 201 lb (91.2 kg)  06/28/19 201 lb (91.2 kg)    Physical Exam Vitals and nursing note reviewed.  Constitutional:      General: She is not in acute distress.    Appearance: Normal appearance. She is not ill-appearing, toxic-appearing or diaphoretic.  HENT:     Head: Normocephalic and atraumatic.     Right Ear: External ear normal.     Left Ear: External ear normal.     Nose: Nose normal.     Mouth/Throat:     Mouth: Mucous membranes are moist.     Pharynx: Oropharynx is clear.  Eyes:     General: No scleral icterus.       Right eye: No discharge.        Left eye: No discharge.     Extraocular Movements: Extraocular movements intact.     Conjunctiva/sclera: Conjunctivae normal.     Pupils: Pupils are equal, round, and reactive to light.  Cardiovascular:     Rate and Rhythm: Normal rate and regular rhythm.     Pulses: Normal pulses.     Heart  sounds: Normal heart sounds. No murmur heard.   No friction rub. No gallop.  Pulmonary:     Effort: Pulmonary effort is normal. No respiratory distress.     Breath sounds: Normal breath sounds. No stridor. No wheezing, rhonchi or rales.  Chest:     Chest wall: No tenderness.  Musculoskeletal:        General: Normal range of motion.     Cervical back: Normal range of motion and neck supple.  Skin:    General: Skin is warm and dry.     Capillary Refill: Capillary refill takes less than 2 seconds.     Coloration: Skin is not jaundiced or pale.     Findings: No bruising, erythema, lesion or rash.   Neurological:     General: No focal deficit present.     Mental Status: She is alert and oriented to person, place, and time. Mental status is at baseline.  Psychiatric:        Mood and Affect: Mood normal.        Behavior: Behavior normal.        Thought Content: Thought content normal.        Judgment: Judgment normal.    Results for orders placed or performed in visit on 06/30/19  Surgical pathology  Result Value Ref Range   SURGICAL PATHOLOGY      SURGICAL PATHOLOGY CASE: [id number] PATIENT: [name] Surgical Pathology Report     Clinical History: neoplasm of uncertain behavior - mid back, right axilla, neoplasm left lower leg, present for 20+ years (cm)     FINAL MICROSCOPIC DIAGNOSIS:  A. SKIN, RIGHT AXILLA, EXCISION: - Melanocytic nevus. - No significant cytologic atypia is identified.  B. SKIN, MID BACK, EXCISION: - Melanocytic nevus. - No significant cytologic atypia is identified.  C. SKIN, LEFT LOWER LEG/SHIN, EXCISION: - Dermatofibroma. - See comment.  COMMENT:  C. Dr. Nira Retort of dermatopathology has reviewed part C and concurs with this interpretation.   Valenzuela DESCRIPTION:  A: Received in formalin is a 1.1 x 0.7 x 0.6 cm soft pale brown skin nodule.  The specimen is inked, sectioned and entirely submitted in 1 cassette.  B: Received in formalin is a 0.6 x 0.6 x 0.5 cm soft pale brown papule. The specimen is inked, bisected and entirely submitted in 1 cassette.  C: Received  in formalin is a 0.7 x 0.5 x 0.1 cm skin shave.  The specimen is inked, sectioned and entirely submitted in 1 cassette.  [facility] 07/01/2019)   Final Diagnosis performed by Enid Cutter, MD.   Electronically signed 07/05/2019 Technical and / or Professional components performed at [facility]. [facility], [place] [address] [place], [place] [address].  Immunohistochemistry Technical component (if applicable) was performed at [facility]. [address], [place], [place], [place] [address].   IMMUNOHISTOCHEMISTRY DISCLAIMER (if applicable): Some of these immunohistochemical stains may have been developed and the performance characteristics determine by [facility]. Some may not have been cleared or approved by the U.S. Food and Drug Administration. The FDA has determined that such clearance or approval is not necessary. This test is used for clinical purposes. It should not be regarded as investigational or for research. This laboratory is ce rtified under the Clinical Laboratory Improvement Amendments of 1988 (CLIA-88) as qualified to perform high complexity clinical laboratory testing.  The controls stained appropriately.       Assessment & Plan:   Problem List Items Addressed This Visit  Other   Depression, major, single episode, severe ([place])    Following with psychiatry. Stable. Continue to monitor.        Relevant Medications   desvenlafaxine (PRISTIQ) 50 MG 24 hr tablet   PTSD (post-traumatic stress disorder)    Following with psychiatry. Stable. Continue to monitor.        Relevant Medications   desvenlafaxine (PRISTIQ) 50 MG 24 hr tablet   Back pain   Relevant Medications   methocarbamol (ROBAXIN) 500 MG tablet   tiZANidine (ZANAFLEX) 4 MG tablet   Anxiety    Following with psychiatry. Stable. Continue to monitor.        Relevant Medications   desvenlafaxine (PRISTIQ) 50 MG 24 hr tablet   Fibromyalgia - Primary    Having brain fog on the lyrica- will try to decrease her lyrica and recheck 1-2 months. Call with any concerns. Continue to monitor.        Relevant Medications   desvenlafaxine (PRISTIQ) 50 MG 24 hr tablet   pregabalin (LYRICA) 100 MG capsule   methocarbamol (ROBAXIN) 500 MG tablet   tiZANidine (ZANAFLEX) 4 MG tablet     Follow up plan: Return 1-2 months, for physical.

## 2020-10-19 NOTE — Assessment & Plan Note (Signed)
Having brain fog on the lyrica- will try to decrease her lyrica and recheck 1-2 months. Call with any concerns. Continue to monitor.

## 2020-11-30 ENCOUNTER — Other Ambulatory Visit: Payer: Self-pay

## 2020-11-30 ENCOUNTER — Ambulatory Visit (INDEPENDENT_AMBULATORY_CARE_PROVIDER_SITE_OTHER): Payer: Self-pay | Admitting: Family Medicine

## 2020-11-30 ENCOUNTER — Other Ambulatory Visit (HOSPITAL_COMMUNITY)
Admission: RE | Admit: 2020-11-30 | Discharge: 2020-11-30 | Disposition: A | Discharge status: Home / Self Care | Payer: Medicaid Other | Source: Ambulatory Visit | Attending: Family Medicine | Admitting: Family Medicine

## 2020-11-30 ENCOUNTER — Encounter: Payer: Self-pay | Admitting: Family Medicine

## 2020-11-30 VITALS — BP 124/83 | HR 76 | Temp 99.1°F | Ht 62.01 in | Wt 214.4 lb

## 2020-11-30 DIAGNOSIS — Z0000 Encounter for general adult medical examination without abnormal findings: Secondary | ICD-10-CM | POA: Insufficient documentation

## 2020-11-30 DIAGNOSIS — G43909 Migraine, unspecified, not intractable, without status migrainosus: Secondary | ICD-10-CM

## 2020-11-30 DIAGNOSIS — Z1231 Encounter for screening mammogram for malignant neoplasm of breast: Secondary | ICD-10-CM

## 2020-11-30 DIAGNOSIS — N898 Other specified noninflammatory disorders of vagina: Secondary | ICD-10-CM

## 2020-11-30 LAB — URINALYSIS, ROUTINE W REFLEX MICROSCOPIC
Bilirubin, UA: NEGATIVE
Glucose, UA: NEGATIVE
Ketones, UA: NEGATIVE
Nitrite, UA: NEGATIVE
Protein,UA: NEGATIVE
RBC, UA: NEGATIVE
Specific Gravity, UA: 1.01 (ref 1.005–1.030)
Urobilinogen, Ur: 0.2 mg/dL (ref 0.2–1.0)
pH, UA: 6.5 (ref 5.0–7.5)

## 2020-11-30 LAB — MICROSCOPIC EXAMINATION: RBC, Urine: NONE SEEN /hpf (ref 0–2)

## 2020-11-30 LAB — WET PREP FOR TRICH, YEAST, CLUE
Clue Cell Exam: POSITIVE — AB
Trichomonas Exam: POSITIVE — AB
Yeast Exam: NEGATIVE

## 2020-11-30 MED ORDER — METRONIDAZOLE 500 MG PO TABS: 500.0000 mg | ORAL_TABLET | Freq: Two times a day (BID) | ORAL | 0 refills | Status: DC

## 2020-11-30 MED ORDER — KETOROLAC TROMETHAMINE 60 MG/2ML IM SOLN
60.0000 mg | Freq: Once | INTRAMUSCULAR | Status: AC
  Administered 2020-11-30: 60 mg via INTRAMUSCULAR

## 2020-11-30 MED ORDER — PREGABALIN 100 MG PO CAPS: ORAL_CAPSULE | ORAL | 3 refills | Status: DC

## 2020-11-30 NOTE — Progress Notes (Signed)
BP 124/83   Pulse 76   Temp 99.1 F (37.3 C) (Oral)   Ht 5' 2.01" (1.575 m)   Wt 214 lb 6.4 oz (97.3 kg)   SpO2 96%   BMI 39.20 kg/m    Subjective:    Patient ID: Sydney Valenzuela, female    DOB: May 24, 1980, 40 y.o.   MRN: [id number]  HPI: Sydney Valenzuela is a 40 y.o. female presenting on 11/30/2020 for comprehensive medical examination. Current medical complaints include:none  She currently lives with: husband and kids Menopausal Symptoms: no  Depression Screen done today and results listed below:  Depression screen [facility] 2/9 11/30/2020 09/26/2020 06/28/2019 11/09/2018 12/19/2017  Decreased Interest 2 0 3 0 1  Down, Depressed, Hopeless 3 0 '3 1 1  '$ PHQ - 2 Score 5 0 '6 1 2  '$ Altered sleeping 2 0 '2 1 1  '$ Tired, decreased energy 3 0 '3 1 1  '$ Change in appetite 0 0 '2 1 1  '$ Feeling bad or failure about yourself  3 0 '3 2 2  '$ Trouble concentrating 3 0 '3 2 2  '$ Moving slowly or fidgety/restless 3 0 '2 1 1  '$ Suicidal thoughts 1 0 0 0 0  PHQ-9 Score 20 0 '21 9 10  '$ Difficult doing work/chores - - - Extremely dIfficult -  Some recent data might be hidden   Past Medical History:  Past Medical History:  Diagnosis Date   Allergy    Anxiety    Arthritis    Family history of adverse reaction to anesthesia    N/V, difficult to wake up   Hiatal hernia    Hypertension     Surgical History:  Past Surgical History:  Procedure Laterality Date   CHOLECYSTECTOMY N/A 11/21/2016   Procedure: LAPAROSCOPIC CHOLECYSTECTOMY WITH INTRAOPERATIVE CHOLANGIOGRAM;  Surgeon: Christene Lye, MD;  Location: [facility] ORS;  Service: General;  Laterality: N/A;   ESOPHAGOGASTRODUODENOSCOPY (EGD) WITH PROPOFOL N/A 11/04/2016   Procedure: ESOPHAGOGASTRODUODENOSCOPY (EGD) WITH PROPOFOL;  Surgeon: Manya Silvas, MD;  Location: [facility] ENDOSCOPY;  Service: Endoscopy;  Laterality: N/A;   LEEP     NASAL SINUS SURGERY     TONSILLECTOMY     Medications:  Current Outpatient Medications on File Prior to Visit  Medication Sig    acetaminophen (TYLENOL) 500 MG tablet Take 500-1,000 mg by mouth daily as needed for moderate pain or headache.   albuterol (PROVENTIL) (2.5 MG/3ML) 0.083% nebulizer solution Take 3 mLs (2.5 mg total) by nebulization every 6 (six) hours as needed for wheezing or shortness of breath.   ALPRAZolam (XANAX) 0.5 MG tablet Take 0.5 mg by mouth at bedtime as needed.   desvenlafaxine (PRISTIQ) 50 MG 24 hr tablet Take 1 tablet by mouth daily.   diphenhydrAMINE HCl (BENADRYL ALLERGY PO) Take by mouth 2 (two) times daily.   melatonin 5 MG TABS Take 5 mg by mouth at bedtime as needed.   methocarbamol (ROBAXIN) 500 MG tablet Take 1 tablet (500 mg total) by mouth every 8 (eight) hours as needed for muscle spasms.   pantoprazole (PROTONIX) 40 MG tablet Take 1 tablet (40 mg total) by mouth 2 (two) times daily.   tiZANidine (ZANAFLEX) 4 MG tablet Take 1 tablet (4 mg total) by mouth every 6 (six) hours as needed for muscle spasms.   UNABLE TO FIND Take by mouth as needed. CBD oil   cyclobenzaprine (FLEXERIL) 5 MG tablet Take by mouth.   No current facility-administered medications on file prior to visit.  Allergies:  Allergies  Allergen Reactions   Codeine Itching    Initial dose is the worst, but becomes more tolerable with more doses, severe itching and tongue swelling    Social History:  Social History   Socioeconomic History   Marital status: Married    Spouse name: Not on file   Number of children: Not on file   Years of education: Not on file   Highest education level: Not on file  Occupational History   Not on file  Tobacco Use   Smoking status: Every Day    Packs/day: 0.25    Types: Cigarettes   Smokeless tobacco: Never  Vaping Use   Vaping Use: Some days  Substance and Sexual Activity   Alcohol use: Yes    Comment: occasional   Drug use: No   Sexual activity: Yes    Birth control/protection: I.U.D.  Other Topics Concern   Not on file  Social History Narrative   Not on file    Social Determinants of Health   Financial Resource Strain: Not on file  Food Insecurity: Not on file  Transportation Needs: Not on file  Physical Activity: Not on file  Stress: Not on file  Social Connections: Not on file  Intimate Partner Violence: Not on file   Social History   Tobacco Use  Smoking Status Every Day   Packs/day: 0.25   Types: Cigarettes  Smokeless Tobacco Never   Social History   Substance and Sexual Activity  Alcohol Use Yes   Comment: occasional    Family History:  Family History  Problem Relation Age of Onset   Hypertension Mother    Allergies Mother    Hyperlipidemia Mother    Anxiety disorder Daughter    Hypertension Son    Cancer Maternal Uncle        prostate   Diabetes Maternal Uncle    Heart disease Maternal Uncle    Allergies Maternal Grandmother    COPD Maternal Grandmother    Alzheimer's disease Maternal Grandmother    Diabetes Maternal Grandfather    Heart disease Maternal Grandfather     Past medical history, surgical history, medications, allergies, family history and social history reviewed with patient today and changes made to appropriate areas of the chart.   Review of Systems  Constitutional:  Positive for diaphoresis. Negative for chills, fever, malaise/fatigue and weight loss.  HENT: Negative.    Eyes:  Positive for blurred vision. Negative for double vision, photophobia, pain, discharge and redness.  Respiratory: Negative.    Cardiovascular:  Positive for chest pain (comes and goes- chronic), palpitations (comes and goes- chronic) and leg swelling (in the heat). Negative for orthopnea, claudication and PND.  Gastrointestinal:  Positive for heartburn and nausea. Negative for abdominal pain, blood in stool, constipation, diarrhea, melena and vomiting.  Genitourinary: Negative.   Musculoskeletal:  Positive for joint pain. Negative for back pain, falls, myalgias and neck pain.  Skin: Negative.   Neurological: Negative.    Endo/Heme/Allergies:  Positive for environmental allergies and polydipsia. Bruises/bleeds easily.  Psychiatric/Behavioral:  Positive for depression. Negative for hallucinations, memory loss, substance abuse and suicidal ideas. The patient is nervous/anxious. The patient does not have insomnia.   All other ROS negative except what is listed above and in the HPI.      Objective:    BP 124/83   Pulse 76   Temp 99.1 F (37.3 C) (Oral)   Ht 5' 2.01" (1.575 m)   Wt 214 lb 6.4 oz (  97.3 kg)   SpO2 96%   BMI 39.20 kg/m   Wt Readings from Last 3 Encounters:  11/30/20 214 lb 6.4 oz (97.3 kg)  10/19/20 216 lb 6.4 oz (98.2 kg)  06/30/19 201 lb (91.2 kg)    Physical Exam Vitals and nursing note reviewed. Exam conducted with a chaperone present.  Constitutional:      General: She is not in acute distress.    Appearance: Normal appearance. She is not ill-appearing, toxic-appearing or diaphoretic.  HENT:     Head: Normocephalic and atraumatic.     Right Ear: Tympanic membrane, ear canal and external ear normal. There is no impacted cerumen.     Left Ear: Tympanic membrane, ear canal and external ear normal. There is no impacted cerumen.     Nose: Nose normal. No congestion or rhinorrhea.     Mouth/Throat:     Mouth: Mucous membranes are moist.     Pharynx: Oropharynx is clear. No oropharyngeal exudate or posterior oropharyngeal erythema.  Eyes:     General: No scleral icterus.       Right eye: No discharge.        Left eye: No discharge.     Extraocular Movements: Extraocular movements intact.     Conjunctiva/sclera: Conjunctivae normal.     Pupils: Pupils are equal, round, and reactive to light.  Neck:     Vascular: No carotid bruit.  Cardiovascular:     Rate and Rhythm: Normal rate and regular rhythm.     Pulses: Normal pulses.     Heart sounds: No murmur heard.   No friction rub. No gallop.  Pulmonary:     Effort: Pulmonary effort is normal. No respiratory distress.     Breath  sounds: Normal breath sounds. No stridor. No wheezing, rhonchi or rales.  Chest:     Chest wall: No tenderness.  Breasts:    Right: Normal. No swelling, bleeding, inverted nipple, mass, nipple discharge, skin change or tenderness.     Left: Normal. No swelling, bleeding, inverted nipple, mass, nipple discharge, skin change or tenderness.  Abdominal:     General: Abdomen is flat. Bowel sounds are normal. There is no distension.     Palpations: Abdomen is soft. There is no mass.     Tenderness: There is no abdominal tenderness. There is no right CVA tenderness, left CVA tenderness, guarding or rebound.     Hernia: No hernia is present.  Genitourinary:    Labia:        Right: No rash, tenderness, lesion or injury.        Left: No rash, tenderness, lesion or injury.      Urethra: No prolapse, urethral pain, urethral swelling or urethral lesion.     Vagina: Normal.     Cervix: Normal.     Uterus: Normal.      Adnexa: Right adnexa normal and left adnexa normal.  Musculoskeletal:        General: No swelling, tenderness, deformity or signs of injury.     Cervical back: Normal range of motion and neck supple. No rigidity. No muscular tenderness.     Right lower leg: No edema.     Left lower leg: No edema.  Lymphadenopathy:     Cervical: No cervical adenopathy.  Skin:    General: Skin is warm and dry.     Capillary Refill: Capillary refill takes less than 2 seconds.     Coloration: Skin is not jaundiced or pale.  Findings: No bruising, erythema, lesion or rash.  Neurological:     General: No focal deficit present.     Mental Status: She is alert and oriented to person, place, and time. Mental status is at baseline.     Cranial Nerves: No cranial nerve deficit.     Sensory: No sensory deficit.     Motor: No weakness.     Coordination: Coordination normal.     Gait: Gait normal.     Deep Tendon Reflexes: Reflexes normal.  Psychiatric:        Mood and Affect: Mood normal.         Behavior: Behavior normal.        Thought Content: Thought content normal.        Judgment: Judgment normal.    Results for orders placed or performed in visit on 06/30/19  Surgical pathology  Result Value Ref Range   SURGICAL PATHOLOGY      SURGICAL PATHOLOGY CASE: [id number] PATIENT: Shanautica Lonia Skinner Surgical Pathology Report     Clinical History: neoplasm of uncertain behavior - mid back, right axilla, neoplasm left lower leg, present for 20+ years (cm)     FINAL MICROSCOPIC DIAGNOSIS:  A. SKIN, RIGHT AXILLA, EXCISION: - Melanocytic nevus. - No significant cytologic atypia is identified.  B. SKIN, MID BACK, EXCISION: - Melanocytic nevus. - No significant cytologic atypia is identified.  C. SKIN, LEFT LOWER LEG/SHIN, EXCISION: - Dermatofibroma. - See comment.  COMMENT:  C. Dr. Nira Retort of dermatopathology has reviewed part C and concurs with this interpretation.   Valenzuela DESCRIPTION:  A: Received in formalin is a 1.1 x 0.7 x 0.6 cm soft pale brown skin nodule.  The specimen is inked, sectioned and entirely submitted in 1 cassette.  B: Received in formalin is a 0.6 x 0.6 x 0.5 cm soft pale brown papule. The specimen is inked, bisected and entirely submitted in 1 cassette.  C: Received  in formalin is a 0.7 x 0.5 x 0.1 cm skin shave.  The specimen is inked, sectioned and entirely submitted in 1 cassette.  [facility] 07/01/2019)   Final Diagnosis performed by Enid Cutter, MD.   Electronically signed 07/05/2019 Technical and / or Professional components performed at [facility]. [facility], [place] [address] [place], [place] [address].  Immunohistochemistry Technical component (if applicable) was performed at [facility]. [address] [place], [place], [place] [address].   IMMUNOHISTOCHEMISTRY DISCLAIMER (if applicable): Some of these immunohistochemical stains may have been developed and the performance characteristics determine by [facility]. Some may not have been cleared or approved by the U.S. Food and Drug Administration. The FDA has determined that such clearance or approval is not necessary. This test is used for clinical purposes. It should not be regarded as investigational or for research. This laboratory is ce rtified under the Clinical Laboratory Improvement Amendments of 1988 (CLIA-88) as qualified to perform high complexity clinical laboratory testing.  The controls stained appropriately.       Assessment & Plan:   Problem List Items Addressed This Visit       Cardiovascular and Mediastinum   Migraine    Toradol shot given today.       Relevant Medications   cyclobenzaprine (FLEXERIL) 5 MG tablet   pregabalin (LYRICA) 100 MG capsule   Other Visit Diagnoses     Routine general medical examination at a health care facility    -  Primary   Vaccines up to date/declined. Screening  labs checked today. Pap done. Mammogram ordered. Continue diet and exercise. Call with any concerns.    Relevant Orders   CBC with Differential/Platelet   Comprehensive metabolic panel   Lipid Panel w/o Chol/HDL Ratio   Urinalysis, Routine w reflex microscopic   TSH   Cytology - PAP   Encounter for screening mammogram for malignant neoplasm of breast       Mammo ordered today.   Relevant Orders   MM 3D SCREEN BREAST BILATERAL   Vaginal discharge       + BV- will treat with flagyl   Relevant Orders   WET PREP FOR [place], YEAST, CLUE        Follow up plan: Return in about 3 months (around 03/01/2021).   LABORATORY TESTING:  - Pap smear: pap done  IMMUNIZATIONS:   - Tdap: Tetanus vaccination status reviewed: last tetanus booster within 10 years. - Influenza: Postponed to flu season - Pneumovax: Refused  SCREENING: -Mammogram: Ordered today    PATIENT COUNSELING:   Advised to take 1 mg of folate supplement per day if capable of pregnancy.   Sexuality: Discussed sexually transmitted diseases,  partner selection, use of condoms, avoidance of unintended pregnancy  and contraceptive alternatives.   Advised to avoid cigarette smoking.  I discussed with the patient that most people either abstain from alcohol or drink within safe limits (<=14/week and <=4 drinks/occasion for males, <=7/weeks and <= 3 drinks/occasion for females) and that the risk for alcohol disorders and other health effects rises proportionally with the number of drinks per week and how often a drinker exceeds daily limits.  Discussed cessation/primary prevention of drug use and availability of treatment for abuse.   Diet: Encouraged to adjust caloric intake to maintain  or achieve ideal body weight, to reduce intake of dietary saturated fat and total fat, to limit sodium intake by avoiding high sodium foods and not adding table salt, and to maintain adequate dietary potassium and calcium preferably from fresh fruits, vegetables, and low-fat dairy products.    stressed the importance of regular exercise  Injury prevention: Discussed safety belts, safety helmets, smoke detector, smoking near bedding or upholstery.   Dental health: Discussed importance of regular tooth brushing, flossing, and dental visits.    NEXT PREVENTATIVE PHYSICAL DUE IN 1 YEAR. Return in about 3 months (around 03/01/2021).

## 2020-11-30 NOTE — Patient Instructions (Signed)
Please call to schedule your mammogram: °[facility]  °Address: [address], [place], [place] [address]  °Phone: [phone] ° °

## 2020-11-30 NOTE — Assessment & Plan Note (Signed)
Toradol shot given today.

## 2020-12-01 LAB — COMPREHENSIVE METABOLIC PANEL WITH GFR
ALT: 23 IU/L (ref 0–32)
AST: 17 IU/L (ref 0–40)
Albumin/Globulin Ratio: 2.4 — ABNORMAL HIGH (ref 1.2–2.2)
Albumin: 4.6 g/dL (ref 3.8–4.8)
Alkaline Phosphatase: 78 IU/L (ref 44–121)
BUN/Creatinine Ratio: 14 (ref 9–23)
BUN: 10 mg/dL (ref 6–24)
Bilirubin Total: 0.3 mg/dL (ref 0.0–1.2)
CO2: 23 mmol/L (ref 20–29)
Calcium: 9.5 mg/dL (ref 8.7–10.2)
Chloride: 100 mmol/L (ref 96–106)
Creatinine, Ser: 0.69 mg/dL (ref 0.57–1.00)
Globulin, Total: 1.9 g/dL (ref 1.5–4.5)
Glucose: 83 mg/dL (ref 65–99)
Potassium: 4 mmol/L (ref 3.5–5.2)
Sodium: 137 mmol/L (ref 134–144)
Total Protein: 6.5 g/dL (ref 6.0–8.5)
eGFR: 112 mL/min/1.73

## 2020-12-01 LAB — CBC WITH DIFFERENTIAL/PLATELET
Basophils Absolute: 0.1 10*3/uL (ref 0.0–0.2)
Basos: 1 %
EOS (ABSOLUTE): 0.1 10*3/uL (ref 0.0–0.4)
Eos: 1 %
Hematocrit: 45.7 % (ref 34.0–46.6)
Hemoglobin: 15.3 g/dL (ref 11.1–15.9)
Immature Grans (Abs): 0 10*3/uL (ref 0.0–0.1)
Immature Granulocytes: 0 %
Lymphocytes Absolute: 2.6 10*3/uL (ref 0.7–3.1)
Lymphs: 26 %
MCH: 30.4 pg (ref 26.6–33.0)
MCHC: 33.5 g/dL (ref 31.5–35.7)
MCV: 91 fL (ref 79–97)
Monocytes Absolute: 0.3 10*3/uL (ref 0.1–0.9)
Monocytes: 3 %
Neutrophils Absolute: 7 10*3/uL (ref 1.4–7.0)
Neutrophils: 69 %
Platelets: 223 10*3/uL (ref 150–450)
RBC: 5.03 x10E6/uL (ref 3.77–5.28)
RDW: 12.8 % (ref 11.7–15.4)
WBC: 10.1 10*3/uL (ref 3.4–10.8)

## 2020-12-01 LAB — LIPID PANEL W/O CHOL/HDL RATIO
Cholesterol, Total: 227 mg/dL — ABNORMAL HIGH (ref 100–199)
HDL: 63 mg/dL
LDL Chol Calc (NIH): 132 mg/dL — ABNORMAL HIGH (ref 0–99)
Triglycerides: 181 mg/dL — ABNORMAL HIGH (ref 0–149)
VLDL Cholesterol Cal: 32 mg/dL (ref 5–40)

## 2020-12-01 LAB — TSH: TSH: 2.11 u[IU]/mL (ref 0.450–4.500)

## 2020-12-07 ENCOUNTER — Encounter: Payer: Self-pay | Admitting: Family Medicine

## 2020-12-08 LAB — CYTOLOGY - PAP
Comment: NEGATIVE
Diagnosis: NEGATIVE
High risk HPV: NEGATIVE

## 2020-12-20 ENCOUNTER — Other Ambulatory Visit: Payer: Self-pay | Admitting: Family Medicine

## 2020-12-20 NOTE — Telephone Encounter (Signed)
Requested medication (s) are due for refill today: Yes  Requested medication (s) are on the active medication list: Yes  Last refill:  10/10/20  Future visit scheduled: Yes  Notes to clinic:  See request.    Requested Prescriptions  Pending Prescriptions Disp Refills   tiZANidine (ZANAFLEX) 4 MG tablet [Pharmacy Med Name: tiZANidine HCL 4MG  TABLET] 60 tablet 1    Sig: TAKE ONE TABLET BY MOUTH EVERY 6 HOURS AS NEEDED FOR MUSCLE SPASMS     Not Delegated - Cardiovascular:  [facility] - tizanidine Failed - 12/20/2020 10:37 AM      Failed - This refill cannot be delegated      Passed - Valid encounter within last 6 months    Recent Outpatient Visits           2 weeks ago Routine general medical examination at a health care facility   [facility], [place] P, DO   2 months ago [place], Megan P, DO   2 months ago Acute non-recurrent maxillary sinusitis   [place], Megan P, DO   5 months ago Shortness of breath   [place], NP   9 months ago Acute non-recurrent frontal sinusitis   [facility], Barbaraann Faster, NP       Future Appointments             In 2 months Wynetta Emery, Barb Merino, DO [facility]

## 2021-03-01 ENCOUNTER — Ambulatory Visit: Payer: Self-pay | Admitting: Family Medicine

## 2021-03-01 ENCOUNTER — Encounter: Payer: Self-pay | Admitting: Family Medicine

## 2021-03-05 ENCOUNTER — Encounter: Payer: Self-pay | Admitting: Family Medicine

## 2021-03-05 ENCOUNTER — Other Ambulatory Visit: Payer: Self-pay

## 2021-03-05 ENCOUNTER — Ambulatory Visit (INDEPENDENT_AMBULATORY_CARE_PROVIDER_SITE_OTHER): Payer: Self-pay | Admitting: Family Medicine

## 2021-03-05 VITALS — BP 125/71 | HR 79 | Temp 97.9°F | Wt 217.6 lb

## 2021-03-05 DIAGNOSIS — F419 Anxiety disorder, unspecified: Secondary | ICD-10-CM

## 2021-03-05 DIAGNOSIS — M797 Fibromyalgia: Secondary | ICD-10-CM

## 2021-03-05 DIAGNOSIS — L509 Urticaria, unspecified: Secondary | ICD-10-CM

## 2021-03-05 DIAGNOSIS — F322 Major depressive disorder, single episode, severe without psychotic features: Secondary | ICD-10-CM

## 2021-03-05 DIAGNOSIS — Z9151 Personal history of suicidal behavior: Secondary | ICD-10-CM

## 2021-03-05 MED ORDER — FAMOTIDINE 20 MG PO TABS: 20.0000 mg | ORAL_TABLET | Freq: Two times a day (BID) | ORAL | 1 refills | Status: DC

## 2021-03-05 MED ORDER — TIZANIDINE HCL 4 MG PO TABS
ORAL_TABLET | ORAL | 1 refills | Status: DC
Start: 2021-03-05 — End: 2021-08-08

## 2021-03-05 MED ORDER — METHOCARBAMOL 500 MG PO TABS: 500.0000 mg | ORAL_TABLET | Freq: Three times a day (TID) | ORAL | 3 refills | Status: DC | PRN

## 2021-03-05 MED ORDER — PREGABALIN 100 MG PO CAPS: ORAL_CAPSULE | ORAL | 5 refills | Status: DC

## 2021-03-05 MED ORDER — CETIRIZINE HCL 10 MG PO TABS: 10.0000 mg | ORAL_TABLET | Freq: Every day | ORAL | 4 refills | Status: DC

## 2021-03-05 MED ORDER — MONTELUKAST SODIUM 10 MG PO TABS: 10.0000 mg | ORAL_TABLET | Freq: Every day | ORAL | 1 refills | Status: DC

## 2021-03-05 MED ORDER — PREGABALIN 50 MG PO CAPS: 50.0000 mg | ORAL_CAPSULE | Freq: Two times a day (BID) | ORAL | 3 refills | Status: DC
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
## 2021-03-05 NOTE — Patient Instructions (Addendum)
[facility] [phone] or email [email or web address] to make an appointment.

## 2021-03-06 NOTE — Assessment & Plan Note (Signed)
Not doing well. Will increase her lyrica and recheck 10month. Call with any concerns.

## 2021-03-06 NOTE — Assessment & Plan Note (Signed)
Not doing well. Following with psychiatry. Has action plan if things get bad. Continue to monitor closely. Call with any concerns.

## 2021-03-29 ENCOUNTER — Other Ambulatory Visit: Payer: Self-pay | Admitting: Nurse Practitioner

## 2021-03-30 NOTE — Telephone Encounter (Signed)
Requested Prescriptions  Pending Prescriptions Disp Refills   pantoprazole (PROTONIX) 40 MG tablet [Pharmacy Med Name: PANTOPRAZOLE SOD DR 40 MG TAB] 180 tablet 0    Sig: TAKE ONE TABLET BY MOUTH TWICE A DAY     Gastroenterology: Proton Pump Inhibitors Passed - 03/29/2021  9:55 PM      Passed - Valid encounter within last 12 months    Recent Outpatient Visits          3 weeks ago [place], Megan P, DO   4 months ago Routine general medical examination at a health care facility   [facility], [place] P, DO   5 months ago [place], Megan P, DO   6 months ago Acute non-recurrent maxillary sinusitis   [place], Megan P, DO   9 months ago Shortness of breath   [place], NP      Future Appointments            In 4 days Wynetta Emery, Barb Merino, DO [facility]

## 2021-04-03 ENCOUNTER — Ambulatory Visit (INDEPENDENT_AMBULATORY_CARE_PROVIDER_SITE_OTHER): Payer: Self-pay | Admitting: Family Medicine

## 2021-04-03 ENCOUNTER — Other Ambulatory Visit: Payer: Self-pay

## 2021-04-03 ENCOUNTER — Encounter: Payer: Self-pay | Admitting: Family Medicine

## 2021-04-03 VITALS — BP 119/77 | HR 85 | Temp 98.5°F | Wt 225.8 lb

## 2021-04-03 DIAGNOSIS — M797 Fibromyalgia: Secondary | ICD-10-CM

## 2021-04-03 DIAGNOSIS — F322 Major depressive disorder, single episode, severe without psychotic features: Secondary | ICD-10-CM

## 2021-04-03 DIAGNOSIS — Z720 Tobacco use: Secondary | ICD-10-CM

## 2021-04-03 MED ORDER — PREGABALIN 50 MG PO CAPS
50.0000 mg | ORAL_CAPSULE | Freq: Two times a day (BID) | ORAL | 5 refills | Status: DC
Start: 2021-04-03 — End: 2021-09-10

## 2021-04-03 MED ORDER — NICOTINE 21 MG/24HR TD PT24: 21.0000 mg | MEDICATED_PATCH | Freq: Every day | TRANSDERMAL | 0 refills | Status: DC

## 2021-04-03 NOTE — Progress Notes (Signed)
BP 119/77    Pulse 85    Temp 98.5 F (36.9 C)    Wt 225 lb 12.8 oz (102.4 kg)    SpO2 97%    BMI 41.29 kg/m    Subjective:    Patient ID: Sydney Valenzuela, female    DOB: 1980/05/10, 41 y.o.   MRN: [id number]  HPI: Sydney Valenzuela is a 41 y.o. female  Chief Complaint  Patient presents with   Fibromyalgia    Follow up    FIBROMYALGIA Pain status: stable Satisfied with current treatment?: yes Medication side effects: no Medication compliance: excellent compliance Duration: chronic Location: widespread Quality: aching and sore Current pain level: moderate Previous pain level: severe Aggravating factors: overdoing it Alleviating factors: lyrica Previous pain specialty evaluation: yes Non-narcotic analgesic meds: yes Narcotic contract:no Treatments attempted: rest, ice, heat, APAP, ibuprofen, aleve, and HEP   Relevant past medical, surgical, family and social history reviewed and updated as indicated. Interim medical history since our last visit reviewed. Allergies and medications reviewed and updated.  Review of Systems  Constitutional:  Positive for fatigue. Negative for activity change, appetite change, chills, diaphoresis, fever and unexpected weight change.  Respiratory: Negative.    Cardiovascular: Negative.   Gastrointestinal: Negative.   Musculoskeletal:  Positive for back pain, myalgias, neck pain and neck stiffness. Negative for arthralgias, gait problem and joint swelling.  Skin: Negative.   Neurological: Negative.   Psychiatric/Behavioral: Negative.     Per HPI unless specifically indicated above     Objective:    BP 119/77    Pulse 85    Temp 98.5 F (36.9 C)    Wt 225 lb 12.8 oz (102.4 kg)    SpO2 97%    BMI 41.29 kg/m   Wt Readings from Last 3 Encounters:  04/03/21 225 lb 12.8 oz (102.4 kg)  03/05/21 217 lb 9.6 oz (98.7 kg)  11/30/20 214 lb 6.4 oz (97.3 kg)    Physical Exam Vitals and nursing note reviewed.  Constitutional:      General: She is  not in acute distress.    Appearance: Normal appearance. She is not ill-appearing, toxic-appearing or diaphoretic.  HENT:     Head: Normocephalic and atraumatic.     Right Ear: External ear normal.     Left Ear: External ear normal.     Nose: Nose normal.     Mouth/Throat:     Mouth: Mucous membranes are moist.     Pharynx: Oropharynx is clear.  Eyes:     General: No scleral icterus.       Right eye: No discharge.        Left eye: No discharge.     Extraocular Movements: Extraocular movements intact.     Conjunctiva/sclera: Conjunctivae normal.     Pupils: Pupils are equal, round, and reactive to light.  Cardiovascular:     Rate and Rhythm: Normal rate and regular rhythm.     Pulses: Normal pulses.     Heart sounds: Normal heart sounds. No murmur heard.   No friction rub. No gallop.  Pulmonary:     Effort: Pulmonary effort is normal. No respiratory distress.     Breath sounds: Normal breath sounds. No stridor. No wheezing, rhonchi or rales.  Chest:     Chest wall: No tenderness.  Musculoskeletal:        General: Normal range of motion.     Cervical back: Normal range of motion and neck supple.  Skin:  General: Skin is warm and dry.     Capillary Refill: Capillary refill takes less than 2 seconds.     Coloration: Skin is not jaundiced or pale.     Findings: No bruising, erythema, lesion or rash.  Neurological:     General: No focal deficit present.     Mental Status: She is alert and oriented to person, place, and time. Mental status is at baseline.  Psychiatric:        Mood and Affect: Mood normal.        Behavior: Behavior normal.        Thought Content: Thought content normal.        Judgment: Judgment normal.    Results for orders placed or performed in visit on 11/30/20  WET PREP FOR [place], YEAST, CLUE   Specimen: Sterile Swab   Sterile Swab  Result Value Ref Range   Trichomonas Exam Positive (A) Negative   Yeast Exam Negative Negative   Clue Cell Exam  Positive (A) Negative  Microscopic Examination   Urine  Result Value Ref Range   WBC, UA 0-5 0 - 5 /hpf   RBC None seen 0 - 2 /hpf   Epithelial Cells (non renal) 0-10 0 - 10 /hpf   Bacteria, UA Few (A) None seen/Few  CBC with Differential/Platelet  Result Value Ref Range   WBC 10.1 3.4 - 10.8 x10E3/uL   RBC 5.03 3.77 - 5.28 x10E6/uL   Hemoglobin 15.3 11.1 - 15.9 g/dL   Hematocrit 45.7 34.0 - 46.6 %   MCV 91 79 - 97 fL   MCH 30.4 26.6 - 33.0 pg   MCHC 33.5 31.5 - 35.7 g/dL   RDW 12.8 11.7 - 15.4 %   Platelets 223 150 - 450 x10E3/uL   Neutrophils 69 Not Estab. %   Lymphs 26 Not Estab. %   Monocytes 3 Not Estab. %   Eos 1 Not Estab. %   Basos 1 Not Estab. %   Neutrophils Absolute 7.0 1.4 - 7.0 x10E3/uL   Lymphocytes Absolute 2.6 0.7 - 3.1 x10E3/uL   Monocytes Absolute 0.3 0.1 - 0.9 x10E3/uL   EOS (ABSOLUTE) 0.1 0.0 - 0.4 x10E3/uL   Basophils Absolute 0.1 0.0 - 0.2 x10E3/uL   Immature Granulocytes 0 Not Estab. %   Immature Grans (Abs) 0.0 0.0 - 0.1 x10E3/uL  Comprehensive metabolic panel  Result Value Ref Range   Glucose 83 65 - 99 mg/dL   BUN 10 6 - 24 mg/dL   Creatinine, Ser 0.69 0.57 - 1.00 mg/dL   eGFR 112 >59 mL/min/1.73   BUN/Creatinine Ratio 14 9 - 23   Sodium 137 134 - 144 mmol/L   Potassium 4.0 3.5 - 5.2 mmol/L   Chloride 100 96 - 106 mmol/L   CO2 23 20 - 29 mmol/L   Calcium 9.5 8.7 - 10.2 mg/dL   Total Protein 6.5 6.0 - 8.5 g/dL   Albumin 4.6 3.8 - 4.8 g/dL   Globulin, Total 1.9 1.5 - 4.5 g/dL   Albumin/Globulin Ratio 2.4 (H) 1.2 - 2.2   Bilirubin Total 0.3 0.0 - 1.2 mg/dL   Alkaline Phosphatase 78 44 - 121 IU/L   AST 17 0 - 40 IU/L   ALT 23 0 - 32 IU/L  Lipid Panel w/o Chol/HDL Ratio  Result Value Ref Range   Cholesterol, Total 227 (H) 100 - 199 mg/dL   Triglycerides 181 (H) 0 - 149 mg/dL   HDL 63 >39 mg/dL   VLDL Cholesterol Cal  32 5 - 40 mg/dL   LDL Chol Calc (NIH) 132 (H) 0 - 99 mg/dL  Urinalysis, Routine w reflex microscopic  Result Value Ref  Range   Specific Gravity, UA 1.010 1.005 - 1.030   pH, UA 6.5 5.0 - 7.5   Color, UA Yellow Yellow   Appearance Ur Clear Clear   Leukocytes,UA Trace (A) Negative   Protein,UA Negative Negative/Trace   Glucose, UA Negative Negative   Ketones, UA Negative Negative   RBC, UA Negative Negative   Bilirubin, UA Negative Negative   Urobilinogen, Ur 0.2 0.2 - 1.0 mg/dL   Nitrite, UA Negative Negative   Microscopic Examination See below:   TSH  Result Value Ref Range   TSH 2.110 0.450 - 4.500 uIU/mL  Cytology - PAP  Result Value Ref Range   High risk HPV Negative    Adequacy      Satisfactory for evaluation; transformation zone component PRESENT.   Diagnosis      - Negative for intraepithelial lesion or malignancy (NILM)   Microorganisms Trichomonas vaginalis present    Comment Normal Reference Range HPV - Negative       Assessment & Plan:   Problem List Items Addressed This Visit       Other   Depression, major, single episode, severe ([place])    Following with psychiatry. Has not started her lamictal yet- will start it after her grandfather's funeral.      Relevant Medications   ALPRAZolam (XANAX) 0.5 MG tablet   Fibromyalgia - Primary    Under good control on current regimen. Continue current regimen. Continue to monitor. Call with any concerns. Refills given.         Relevant Medications   lamoTRIgine (LAMICTAL) 25 MG tablet   pregabalin (LYRICA) 50 MG capsule   Other Visit Diagnoses     Tobacco abuse       Would like to try nicoderm. Start as able. Conitnue to monitor.         Follow up plan: Return 2 months.

## 2021-04-03 NOTE — Assessment & Plan Note (Signed)
Under good control on current regimen. Continue current regimen. Continue to monitor. Call with any concerns. Refills given.   

## 2021-04-03 NOTE — Assessment & Plan Note (Signed)
Following with psychiatry. Has not started her lamictal yet- will start it after her grandfather's funeral.

## 2021-06-01 ENCOUNTER — Ambulatory Visit (INDEPENDENT_AMBULATORY_CARE_PROVIDER_SITE_OTHER): Payer: Self-pay | Admitting: Family Medicine

## 2021-06-01 ENCOUNTER — Encounter: Payer: Self-pay | Admitting: Family Medicine

## 2021-06-01 ENCOUNTER — Other Ambulatory Visit: Payer: Self-pay

## 2021-06-01 VITALS — BP 134/85 | HR 111 | Temp 98.5°F | Wt 237.4 lb

## 2021-06-01 DIAGNOSIS — L509 Urticaria, unspecified: Secondary | ICD-10-CM

## 2021-06-01 DIAGNOSIS — M797 Fibromyalgia: Secondary | ICD-10-CM

## 2021-06-01 DIAGNOSIS — F322 Major depressive disorder, single episode, severe without psychotic features: Secondary | ICD-10-CM

## 2021-06-01 MED ORDER — PANTOPRAZOLE SODIUM 40 MG PO TBEC: 40.0000 mg | DELAYED_RELEASE_TABLET | Freq: Two times a day (BID) | ORAL | 1 refills | Status: DC

## 2021-06-01 MED ORDER — ALBUTEROL SULFATE HFA 108 (90 BASE) MCG/ACT IN AERS: 2.0000 | INHALATION_SPRAY | Freq: Four times a day (QID) | RESPIRATORY_TRACT | 6 refills | Status: DC | PRN

## 2021-06-01 MED ORDER — ALBUTEROL SULFATE (2.5 MG/3ML) 0.083% IN NEBU: 2.5000 mg | INHALATION_SOLUTION | Freq: Four times a day (QID) | RESPIRATORY_TRACT | 1 refills | Status: AC | PRN

## 2021-06-01 NOTE — Progress Notes (Signed)
BP 134/85    Pulse (!) 111    Temp 98.5 F (36.9 C) (Oral)    Wt 237 lb 6.4 oz (107.7 kg)    SpO2 98%    BMI 43.41 kg/m    Subjective:    Patient ID: Sydney Valenzuela, female    DOB: Aug 16, 1980, 41 y.o.   MRN: [id number]  HPI: Sydney Valenzuela is a 41 y.o. female  Chief Complaint  Patient presents with   Fibromyalgia   Depression   Mood is still not doing well. She feels like she is keeping more inside. She is still having issues with bed bugs. She has been having issues with her mother-in-law. Hasn't been able to see her grandkids. She has been hurting a lot. She did her 2nd application on her disability and is waiting for her determination. She hopes to be able to move out of her living situation if she is approved. She continues with panic attacks and feeling anxious and depressed. This has made her body hurt even worse. She is aching all over and feels like she can't do much. She continues with the hives from the bed bug bites and the medicine doesn't seem to be helping much either. Her husband needs a knee replacement, which is also causing stress as he will be out of work. She has not been feeling like herself and is feeling really frustrated.   Relevant past medical, surgical, family and social history reviewed and updated as indicated. Interim medical history since our last visit reviewed. Allergies and medications reviewed and updated.  Review of Systems  Constitutional: Negative.   Respiratory: Negative.    Cardiovascular: Negative.   Gastrointestinal: Negative.   Musculoskeletal:  Positive for arthralgias, back pain, myalgias, neck pain and neck stiffness. Negative for gait problem and joint swelling.  Skin:  Positive for rash. Negative for color change, pallor and wound.  Psychiatric/Behavioral:  Positive for dysphoric mood. Negative for agitation, behavioral problems, confusion, decreased concentration, hallucinations, self-injury, sleep disturbance and suicidal ideas. The  patient is nervous/anxious. The patient is not hyperactive.    Per HPI unless specifically indicated above     Objective:    BP 134/85    Pulse (!) 111    Temp 98.5 F (36.9 C) (Oral)    Wt 237 lb 6.4 oz (107.7 kg)    SpO2 98%    BMI 43.41 kg/m   Wt Readings from Last 3 Encounters:  06/01/21 237 lb 6.4 oz (107.7 kg)  04/03/21 225 lb 12.8 oz (102.4 kg)  03/05/21 217 lb 9.6 oz (98.7 kg)    Physical Exam Vitals and nursing note reviewed.  Constitutional:      General: She is not in acute distress.    Appearance: Normal appearance. She is not ill-appearing, toxic-appearing or diaphoretic.  HENT:     Head: Normocephalic and atraumatic.     Right Ear: External ear normal.     Left Ear: External ear normal.     Nose: Nose normal.     Mouth/Throat:     Mouth: Mucous membranes are moist.     Pharynx: Oropharynx is clear.  Eyes:     General: No scleral icterus.       Right eye: No discharge.        Left eye: No discharge.     Extraocular Movements: Extraocular movements intact.     Conjunctiva/sclera: Conjunctivae normal.     Pupils: Pupils are equal, round, and reactive to light.  Cardiovascular:  Rate and Rhythm: Normal rate and regular rhythm.     Pulses: Normal pulses.     Heart sounds: Normal heart sounds. No murmur heard.   No friction rub. No gallop.  Pulmonary:     Effort: Pulmonary effort is normal. No respiratory distress.     Breath sounds: Normal breath sounds. No stridor. No wheezing, rhonchi or rales.  Chest:     Chest wall: No tenderness.  Musculoskeletal:        General: Normal range of motion.     Cervical back: Normal range of motion and neck supple.  Skin:    General: Skin is warm and dry.     Capillary Refill: Capillary refill takes less than 2 seconds.     Coloration: Skin is not jaundiced or pale.     Findings: No bruising, erythema, lesion or rash.  Neurological:     General: No focal deficit present.     Mental Status: She is alert and oriented  to person, place, and time. Mental status is at baseline.  Psychiatric:        Mood and Affect: Mood normal.        Behavior: Behavior normal.        Thought Content: Thought content normal.        Judgment: Judgment normal.    Results for orders placed or performed in visit on 11/30/20  WET PREP FOR [place], YEAST, CLUE   Specimen: Sterile Swab   Sterile Swab  Result Value Ref Range   Trichomonas Exam Positive (A) Negative   Yeast Exam Negative Negative   Clue Cell Exam Positive (A) Negative  Microscopic Examination   Urine  Result Value Ref Range   WBC, UA 0-5 0 - 5 /hpf   RBC None seen 0 - 2 /hpf   Epithelial Cells (non renal) 0-10 0 - 10 /hpf   Bacteria, UA Few (A) None seen/Few  CBC with Differential/Platelet  Result Value Ref Range   WBC 10.1 3.4 - 10.8 x10E3/uL   RBC 5.03 3.77 - 5.28 x10E6/uL   Hemoglobin 15.3 11.1 - 15.9 g/dL   Hematocrit 45.7 34.0 - 46.6 %   MCV 91 79 - 97 fL   MCH 30.4 26.6 - 33.0 pg   MCHC 33.5 31.5 - 35.7 g/dL   RDW 12.8 11.7 - 15.4 %   Platelets 223 150 - 450 x10E3/uL   Neutrophils 69 Not Estab. %   Lymphs 26 Not Estab. %   Monocytes 3 Not Estab. %   Eos 1 Not Estab. %   Basos 1 Not Estab. %   Neutrophils Absolute 7.0 1.4 - 7.0 x10E3/uL   Lymphocytes Absolute 2.6 0.7 - 3.1 x10E3/uL   Monocytes Absolute 0.3 0.1 - 0.9 x10E3/uL   EOS (ABSOLUTE) 0.1 0.0 - 0.4 x10E3/uL   Basophils Absolute 0.1 0.0 - 0.2 x10E3/uL   Immature Granulocytes 0 Not Estab. %   Immature Grans (Abs) 0.0 0.0 - 0.1 x10E3/uL  Comprehensive metabolic panel  Result Value Ref Range   Glucose 83 65 - 99 mg/dL   BUN 10 6 - 24 mg/dL   Creatinine, Ser 0.69 0.57 - 1.00 mg/dL   eGFR 112 >59 mL/min/1.73   BUN/Creatinine Ratio 14 9 - 23   Sodium 137 134 - 144 mmol/L   Potassium 4.0 3.5 - 5.2 mmol/L   Chloride 100 96 - 106 mmol/L   CO2 23 20 - 29 mmol/L   Calcium 9.5 8.7 - 10.2 mg/dL   Total Protein  6.5 6.0 - 8.5 g/dL   Albumin 4.6 3.8 - 4.8 g/dL   Globulin, Total 1.9 1.5 -  4.5 g/dL   Albumin/Globulin Ratio 2.4 (H) 1.2 - 2.2   Bilirubin Total 0.3 0.0 - 1.2 mg/dL   Alkaline Phosphatase 78 44 - 121 IU/L   AST 17 0 - 40 IU/L   ALT 23 0 - 32 IU/L  Lipid Panel w/o Chol/HDL Ratio  Result Value Ref Range   Cholesterol, Total 227 (H) 100 - 199 mg/dL   Triglycerides 181 (H) 0 - 149 mg/dL   HDL 63 >39 mg/dL   VLDL Cholesterol Cal 32 5 - 40 mg/dL   LDL Chol Calc (NIH) 132 (H) 0 - 99 mg/dL  Urinalysis, Routine w reflex microscopic  Result Value Ref Range   Specific Gravity, UA 1.010 1.005 - 1.030   pH, UA 6.5 5.0 - 7.5   Color, UA Yellow Yellow   Appearance Ur Clear Clear   Leukocytes,UA Trace (A) Negative   Protein,UA Negative Negative/Trace   Glucose, UA Negative Negative   Ketones, UA Negative Negative   RBC, UA Negative Negative   Bilirubin, UA Negative Negative   Urobilinogen, Ur 0.2 0.2 - 1.0 mg/dL   Nitrite, UA Negative Negative   Microscopic Examination See below:   TSH  Result Value Ref Range   TSH 2.110 0.450 - 4.500 uIU/mL  Cytology - PAP  Result Value Ref Range   High risk HPV Negative    Adequacy      Satisfactory for evaluation; transformation zone component PRESENT.   Diagnosis      - Negative for intraepithelial lesion or malignancy (NILM)   Microorganisms Trichomonas vaginalis present    Comment Normal Reference Range HPV - Negative       Assessment & Plan:   Problem List Items Addressed This Visit       Other   Depression, major, single episode, severe ([place])    Not doing well. Following with psychiatry, but having significant social stressors which seem to be worsening the symptoms. Advised her that she will likely need to change her living situation to help with the symptoms. Call with any concerns. Will get CCM involved to see if they can help.       Relevant Orders   AMB Referral to [facility]   Fibromyalgia    Not doing well. Following with psychiatry, but having significant social stressors which seem to  be worsening the symptoms. Advised her that she will likely need to change her living situation to help with the symptoms. Call with any concerns. Will get CCM involved to see if they can help.       Relevant Medications   lamoTRIgine (LAMICTAL) 100 MG tablet   aspirin-acetaminophen-caffeine (EXCEDRIN MIGRAINE) 250-250-65 MG tablet   Other Relevant Orders   AMB Referral to [facility]   Other Visit Diagnoses     Hives    -  Primary   Still being exposed. Continue current regimen. Attempt to remove herself from exposures. Call with any concerns.         Follow up plan: Return in about 3 months (around 09/01/2021).

## 2021-06-03 NOTE — Assessment & Plan Note (Signed)
Not doing well. Following with psychiatry, but having significant social stressors which seem to be worsening the symptoms. Advised her that she will likely need to change her living situation to help with the symptoms. Call with any concerns. Will get CCM involved to see if they can help.  ?

## 2021-06-05 ENCOUNTER — Telehealth: Payer: Self-pay

## 2021-06-05 NOTE — Chronic Care Management (AMB) (Signed)
?  Care Management  ? ?Outreach Note ? ?06/05/2021 ?Name: Sydney Valenzuela MRN: [id number] DOB: 1980/07/02 ? ?Referred by: Valerie Roys, DO ?Reason for referral : Care Coordination (Outreach to schedule referral with LCSW ) ? ? ?An unsuccessful telephone outreach was attempted today. The patient was referred to the case management team for assistance with care management and care coordination.  ? ?Follow Up Plan:  ?A HIPAA compliant phone message was left for the patient providing contact information and requesting a return call.  ?The care management team will reach out to the patient again over the next 2 days.  ?If patient returns call to provider office, please advise to call [place] * at [phone]* ? ?Noreene Larsson, RMA ?Care Guide, Embedded Care Coordination ?[facility]  [facility]  ?[place], [place] [address] ?Direct Dial: [phone] ?Museum/gallery [email or web address] ?Website: [email or web address]  ? ?

## 2021-06-11 NOTE — Chronic Care Management (AMB) (Signed)
?  Care Management  ? ?Outreach Note ? ?06/11/2021 ?Name: Sydney Valenzuela MRN: [id number] DOB: April 23, 1980 ? ?Referred by: Valerie Roys, DO ?Reason for referral : Care Coordination (Outreach to schedule referral with LCSW ) ? ? ?A second unsuccessful telephone outreach was attempted today. The patient was referred to the case management team for assistance with care management and care coordination.  ? ?Follow Up Plan:  ?A HIPAA compliant phone message was left for the patient providing contact information and requesting a return call.  ?The care management team will reach out to the patient again over the next 7 days.  ?If patient returns call to provider office, please advise to call [place] * at [phone]* ? ?Noreene Larsson, RMA ?Care Guide, Embedded Care Coordination ?[facility]  ?[place], [place] [address] ?Direct Dial: [phone] ?Museum/gallery [email or web address] ?Website: [email or web address]  ? ?

## 2021-06-19 NOTE — Chronic Care Management (AMB) (Signed)
?  Care Management  ? ?Outreach Note ? ?06/19/2021 ?Name: Sydney Valenzuela MRN: [id number] DOB: [birth year] ? ?Referred by: Valerie Roys, DO ?Reason for referral : Care Coordination (Outreach to schedule referral with LCSW ) ? ? ?Third unsuccessful telephone outreach was attempted today. The patient was referred to the case management team for assistance with care management and care coordination. The patient's primary care provider has been notified of our unsuccessful attempts to make or maintain contact with the patient. The care management team is pleased to engage with this patient at any time in the future should he/she be interested in assistance from the care management team.  ? ?Follow Up Plan:  ?We have been unable to make contact with the patient for follow up. The care management team is available to follow up with the patient after provider conversation with the patient regarding recommendation for care management engagement and subsequent re-referral to the care management team.  ? ?Noreene Larsson, RMA ?Care Guide, Embedded Care Coordination ?[facility]  [facility]  ?[place], [place] [address] ?Direct Dial: [phone] ?Museum/gallery conservator.[email or web address] ?Website: [email or web address]  ? ?

## 2021-06-22 ENCOUNTER — Telehealth: Payer: Self-pay | Admitting: Family Medicine

## 2021-06-22 NOTE — Telephone Encounter (Signed)
Copied from [place] [id number]. Topic: General - Other ?>> Jun 22, 2021 12:05 PM Antonieta Iba C wrote: ?Reason for CRM: pt  says that she are suppose to have Repeat labs before her appt. Pt would like further assistance with having orders placed. Pt says that she was suppose to have her Cholesterol & a few other things rechecked. Pt says that it is okay to send her a [employer]. ?

## 2021-06-24 NOTE — Telephone Encounter (Signed)
We can check these at her appt ?

## 2021-06-26 NOTE — Telephone Encounter (Signed)
Patient is aware labs will be checked at next office visit. No further questions.  ?

## 2021-08-02 ENCOUNTER — Encounter: Payer: Self-pay | Admitting: Family Medicine

## 2021-08-06 ENCOUNTER — Encounter: Payer: Self-pay | Admitting: Family Medicine

## 2021-08-06 ENCOUNTER — Other Ambulatory Visit: Payer: Self-pay | Admitting: Family Medicine

## 2021-08-07 NOTE — Telephone Encounter (Signed)
Requested medication (s) are due for refill today: yes ? ?Requested medication (s) are on the active medication list: yes ? ?Last refill:  03/05/21 #180 1 RF ? ?Future visit scheduled: yes ? ?Notes to clinic:  med not delegated to NT to RF ? ? ?Requested Prescriptions  ?Pending Prescriptions Disp Refills  ? tiZANidine (ZANAFLEX) 4 MG tablet [Pharmacy Med Name: tiZANidine HCL '4MG'$  TABLET] 180 tablet 1  ?  Sig: TAKE ONE TABLET BY MOUTH EVERY 6 HOURS AS NEEDED FOR MUSCLE SPASMS  ?  ? Not Delegated - Cardiovascular:  Alpha-2 Agonists - tizanidine Failed - 08/06/2021  2:37 PM  ?  ?  Failed - This refill cannot be delegated  ?  ?  Passed - Valid encounter within last 6 months  ?  Recent Outpatient Visits   ? ?      ? 2 months ago Hives  ? [place], Megan P, DO  ? 4 months ago Fibromyalgia  ? [place], Megan P, DO  ? 5 months ago Fibromyalgia  ? Grace, DO  ? 8 months ago Routine general medical examination at a health care facility  ? Ward, Megan P, DO  ? 9 months ago Fibromyalgia  ? [place], [place] P, DO  ? ?  ?  ?Future Appointments   ? ?        ? In 1 month Johnson, Barb Merino, DO [place]  ? ?  ? ? ?  ?  ?  ? ? ? ? ?

## 2021-09-03 ENCOUNTER — Ambulatory Visit: Payer: Medicaid Other | Admitting: Family Medicine

## 2021-09-10 ENCOUNTER — Telehealth (INDEPENDENT_AMBULATORY_CARE_PROVIDER_SITE_OTHER): Payer: Self-pay | Admitting: Family Medicine

## 2021-09-10 ENCOUNTER — Encounter: Payer: Self-pay | Admitting: Family Medicine

## 2021-09-10 ENCOUNTER — Ambulatory Visit: Payer: Self-pay | Admitting: *Deleted

## 2021-09-10 DIAGNOSIS — Z8619 Personal history of other infectious and parasitic diseases: Secondary | ICD-10-CM

## 2021-09-10 DIAGNOSIS — J069 Acute upper respiratory infection, unspecified: Secondary | ICD-10-CM

## 2021-09-10 DIAGNOSIS — M797 Fibromyalgia: Secondary | ICD-10-CM

## 2021-09-10 MED ORDER — METHOCARBAMOL 500 MG PO TABS
500.0000 mg | ORAL_TABLET | Freq: Three times a day (TID) | ORAL | 3 refills | Status: DC | PRN
Start: 2021-09-10 — End: 2022-05-09

## 2021-09-10 MED ORDER — MONTELUKAST SODIUM 10 MG PO TABS: 10.0000 mg | ORAL_TABLET | Freq: Every day | ORAL | 1 refills | Status: DC

## 2021-09-10 MED ORDER — PREGABALIN 50 MG PO CAPS: 50.0000 mg | ORAL_CAPSULE | Freq: Two times a day (BID) | ORAL | 5 refills | Status: DC

## 2021-09-10 MED ORDER — PREGABALIN 100 MG PO CAPS
ORAL_CAPSULE | ORAL | 5 refills | Status: DC
Start: 2021-09-10 — End: 2022-05-09

## 2021-09-10 MED ORDER — LIDOCAINE VISCOUS HCL 2 % MT SOLN: 15.0000 mL | OROMUCOSAL | 1 refills | Status: AC | PRN

## 2021-09-10 MED ORDER — PREDNISONE 10 MG PO TABS: ORAL_TABLET | ORAL | 0 refills | Status: DC

## 2021-09-10 NOTE — Progress Notes (Signed)
There were no vitals taken for this visit.   Subjective:    Patient ID: Sydney Valenzuela, female    DOB: 1980-06-05, 41 y.o.   MRN: [id number]  HPI: Sydney Valenzuela is a 41 y.o. female  Chief Complaint  Patient presents with   Depression   Fibromyalgia   URI    Patient states she is having facial pain and ST. Symptoms started 2 days ago. Patient states her son was sick last week with the same symptoms.    UPPER RESPIRATORY TRACT INFECTION Worst symptom: 2-3 days Fever: no Cough: yes Shortness of breath: no Wheezing: no Chest pain: no Chest tightness: no Chest congestion: no Nasal congestion: yes Runny nose: no Post nasal drip: no Sneezing: no Sore throat: yes Swollen glands: yes Sinus pressure: yes Headache: yes Face pain: yes Toothache: yes Ear pain: yes bilateral Ear pressure: no  Eyes red/itching:no Eye drainage/crusting: no  Vomiting: no Rash: no Fatigue: yes Sick contacts: yes Strep contacts: no  Context: worse Recurrent sinusitis: no Relief with OTC cold/cough medications: no  Treatments attempted: none   FIBROMYALGIA Pain status: exacerbated Satisfied with current treatment?: no Medication side effects: no Medication compliance: excellent compliance Duration: chronic Location: wide spread Quality: aching and sore Current pain level: severe Previous pain level: severe Aggravating factors: movement, stress, lack of sleep, overdoing it Alleviating factors: medicine Previous pain specialty evaluation: no Non-narcotic analgesic meds: yes Narcotic contract:no Treatments attempted: rest, ice, heat, APAP, ibuprofen, aleve, and physical therapy, lyrica    Relevant past medical, surgical, family and social history reviewed and updated as indicated. Interim medical history since our last visit reviewed. Allergies and medications reviewed and updated.  Review of Systems  Constitutional:  Positive for chills, diaphoresis and fatigue. Negative for  activity change, appetite change, fever and unexpected weight change.  HENT:  Positive for congestion, postnasal drip, rhinorrhea and sore throat. Negative for dental problem, drooling, ear discharge, ear pain, facial swelling, hearing loss, mouth sores, nosebleeds, sinus pressure, sinus pain, sneezing, tinnitus, trouble swallowing and voice change.   Eyes: Negative.   Respiratory:  Positive for cough and shortness of breath. Negative for apnea, choking, chest tightness, wheezing and stridor.   Cardiovascular: Negative.   Gastrointestinal: Negative.   Genitourinary: Negative.   Musculoskeletal: Negative.   Neurological: Negative.   Psychiatric/Behavioral: Negative.      Per HPI unless specifically indicated above     Objective:    There were no vitals taken for this visit.  Wt Readings from Last 3 Encounters:  06/01/21 237 lb 6.4 oz (107.7 kg)  04/03/21 225 lb 12.8 oz (102.4 kg)  03/05/21 217 lb 9.6 oz (98.7 kg)    Physical Exam Vitals and nursing note reviewed.  Constitutional:      General: She is not in acute distress.    Appearance: Normal appearance. She is not ill-appearing, toxic-appearing or diaphoretic.  HENT:     Head: Normocephalic and atraumatic.     Right Ear: External ear normal.     Left Ear: External ear normal.     Nose: Nose normal.     Mouth/Throat:     Mouth: Mucous membranes are moist.     Pharynx: Oropharynx is clear.  Eyes:     General: No scleral icterus.       Right eye: No discharge.        Left eye: No discharge.     Conjunctiva/sclera: Conjunctivae normal.     Pupils: Pupils are equal, round, and  reactive to light.  Pulmonary:     Effort: Pulmonary effort is normal. No respiratory distress.     Comments: Speaking in full sentences Musculoskeletal:        General: Normal range of motion.     Cervical back: Normal range of motion.  Skin:    Coloration: Skin is not jaundiced or pale.     Findings: No bruising, erythema, lesion or rash.   Neurological:     Mental Status: She is alert and oriented to person, place, and time. Mental status is at baseline.  Psychiatric:        Mood and Affect: Mood normal.        Behavior: Behavior normal.        Thought Content: Thought content normal.        Judgment: Judgment normal.     Results for orders placed or performed in visit on 11/30/20  WET PREP FOR [place], YEAST, CLUE   Specimen: Sterile Swab   Sterile Swab  Result Value Ref Range   Trichomonas Exam Positive (A) Negative   Yeast Exam Negative Negative   Clue Cell Exam Positive (A) Negative  Microscopic Examination   Urine  Result Value Ref Range   WBC, UA 0-5 0 - 5 /hpf   RBC None seen 0 - 2 /hpf   Epithelial Cells (non renal) 0-10 0 - 10 /hpf   Bacteria, UA Few (A) None seen/Few  CBC with Differential/Platelet  Result Value Ref Range   WBC 10.1 3.4 - 10.8 x10E3/uL   RBC 5.03 3.77 - 5.28 x10E6/uL   Hemoglobin 15.3 11.1 - 15.9 g/dL   Hematocrit 45.7 34.0 - 46.6 %   MCV 91 79 - 97 fL   MCH 30.4 26.6 - 33.0 pg   MCHC 33.5 31.5 - 35.7 g/dL   RDW 12.8 11.7 - 15.4 %   Platelets 223 150 - 450 x10E3/uL   Neutrophils 69 Not Estab. %   Lymphs 26 Not Estab. %   Monocytes 3 Not Estab. %   Eos 1 Not Estab. %   Basos 1 Not Estab. %   Neutrophils Absolute 7.0 1.4 - 7.0 x10E3/uL   Lymphocytes Absolute 2.6 0.7 - 3.1 x10E3/uL   Monocytes Absolute 0.3 0.1 - 0.9 x10E3/uL   EOS (ABSOLUTE) 0.1 0.0 - 0.4 x10E3/uL   Basophils Absolute 0.1 0.0 - 0.2 x10E3/uL   Immature Granulocytes 0 Not Estab. %   Immature Grans (Abs) 0.0 0.0 - 0.1 x10E3/uL  Comprehensive metabolic panel  Result Value Ref Range   Glucose 83 65 - 99 mg/dL   BUN 10 6 - 24 mg/dL   Creatinine, Ser 0.69 0.57 - 1.00 mg/dL   eGFR 112 >59 mL/min/1.73   BUN/Creatinine Ratio 14 9 - 23   Sodium 137 134 - 144 mmol/L   Potassium 4.0 3.5 - 5.2 mmol/L   Chloride 100 96 - 106 mmol/L   CO2 23 20 - 29 mmol/L   Calcium 9.5 8.7 - 10.2 mg/dL   Total Protein 6.5 6.0 - 8.5  g/dL   Albumin 4.6 3.8 - 4.8 g/dL   Globulin, Total 1.9 1.5 - 4.5 g/dL   Albumin/Globulin Ratio 2.4 (H) 1.2 - 2.2   Bilirubin Total 0.3 0.0 - 1.2 mg/dL   Alkaline Phosphatase 78 44 - 121 IU/L   AST 17 0 - 40 IU/L   ALT 23 0 - 32 IU/L  Lipid Panel w/o Chol/HDL Ratio  Result Value Ref Range   Cholesterol, Total 227 (H)  100 - 199 mg/dL   Triglycerides 181 (H) 0 - 149 mg/dL   HDL 63 >39 mg/dL   VLDL Cholesterol Cal 32 5 - 40 mg/dL   LDL Chol Calc (NIH) 132 (H) 0 - 99 mg/dL  Urinalysis, Routine w reflex microscopic  Result Value Ref Range   Specific Gravity, UA 1.010 1.005 - 1.030   pH, UA 6.5 5.0 - 7.5   Color, UA Yellow Yellow   Appearance Ur Clear Clear   Leukocytes,UA Trace (A) Negative   Protein,UA Negative Negative/Trace   Glucose, UA Negative Negative   Ketones, UA Negative Negative   RBC, UA Negative Negative   Bilirubin, UA Negative Negative   Urobilinogen, Ur 0.2 0.2 - 1.0 mg/dL   Nitrite, UA Negative Negative   Microscopic Examination See below:   TSH  Result Value Ref Range   TSH 2.110 0.450 - 4.500 uIU/mL  Cytology - PAP  Result Value Ref Range   High risk HPV Negative    Adequacy      Satisfactory for evaluation; transformation zone component PRESENT.   Diagnosis      - Negative for intraepithelial lesion or malignancy (NILM)   Microorganisms Trichomonas vaginalis present    Comment Normal Reference Range HPV - Negative       Assessment & Plan:   Problem List Items Addressed This Visit       Other   Fibromyalgia    Seems to still be in flare. Will treat with prednisone for both URI and fibro. Continue lyrica. Continue to monitor. Call with any concerns.       Relevant Medications   lamoTRIgine (LAMICTAL) 200 MG tablet   lamoTRIgine (LAMICTAL) 25 MG tablet   pregabalin (LYRICA) 100 MG capsule   pregabalin (LYRICA) 50 MG capsule   methocarbamol (ROBAXIN) 500 MG tablet   predniSONE (DELTASONE) 10 MG tablet   Other Visit Diagnoses     Upper  respiratory tract infection, unspecified type    -  Primary   Will treat with prednisone taper and lidocaine. Call if not getting better or getting worse. Continue to monitor.    History of trichomoniasis       Will have her come in for swab. Await results. Treat as needed.    Relevant Orders   WET PREP FOR [place], YEAST, CLUE        Follow up plan: Return in about 3 months (around 12/11/2021) for physical.   This visit was completed via video visit through [facility] due to the restrictions of the COVID-19 pandemic. All issues as above were discussed and addressed. Physical exam was done as above through visual confirmation on video through MyChart. If it was felt that the patient should be evaluated in the office, they were directed there. The patient verbally consented to this visit. Location of the patient: home Location of the provider: work Those involved with this call:  Provider: Park Liter, DO CMA: Louanna Raw, [place] Desk/Registration: [employer]  Time spent on call:  25 minutes with patient face to face via video conference. More than 50% of this time was spent in counseling and coordination of care. 40 minutes total spent in review of patient's record and preparation of their chart.

## 2021-09-10 NOTE — Assessment & Plan Note (Signed)
Seems to still be in flare. Will treat with prednisone for both URI and fibro. Continue lyrica. Continue to monitor. Call with any concerns.

## 2021-09-10 NOTE — Telephone Encounter (Signed)
Dr. Wynetta Emery approved virtual visit.

## 2021-09-10 NOTE — Telephone Encounter (Signed)
   Pt is calling to report that she still dizzy for one day with cold like sx. Pt can hear her pulse in her ear. Please advise      Chief Complaint: Sore throat Symptoms: Sore throat, dry cough, chest tightness,dizziness (positional) pulsatile tinnitus. Frequency: Saturday AM, worsening. Pertinent Negatives: Patient denies fever Disposition: '[]'$ ED /'[]'$ Urgent Care (no appt availability in office) / '[x]'$ Appointment(In office/virtual)/ '[]'$  [facility]/ '[]'$ Home Care/ '[]'$ Refused Recommended Disposition /'[]'$ [facility]/ '[]'$  Follow-up with PCP Additional Notes: Pt had 3 month F/U appt today, requested change to virtual due to dizziness. Called practice, [facility], changed to virtual. Aware Dr. Wynetta Emery may want in office visit for eval of acute symptoms. Pt verbalizes understanding. Care advise provided, verbalizes understanding.   Reason for Disposition  Earache also present  Answer Assessment - Initial Assessment Questions 1. ONSET: "When did the throat start hurting?" (Hours or days ago)      Saturday. 2. SEVERITY: "How bad is the sore throat?" (Scale 1-10; mild, moderate or severe)   - MILD (1-3):  doesn't interfere with eating or normal activities   - MODERATE (4-7): interferes with eating some solids and normal activities   - SEVERE (8-10):  excruciating pain, interferes with most normal activities   - SEVERE DYSPHAGIA: can't swallow liquids, drooling     7/10 3. STREP EXPOSURE: "Has there been any exposure to strep within the past week?" If Yes, ask: "What type of contact occurred?"      No. Son had similar symptoms last week. 4.  VIRAL SYMPTOMS: "Are there any symptoms of a cold, such as a runny nose, cough, hoarse voice or red eyes?"      Cough, dry, chest tightness 5. FEVER: "Do you have a fever?" If Yes, ask: "What is your temperature, how was it measured, and when did it start?"     No. Taking Dayquil and Nyquil 6. PUS ON THE TONSILS: "Is there pus on the tonsils in the  back of your throat?"     No 7. OTHER SYMPTOMS: "Do you have any other symptoms?" (e.g., difficulty breathing, headache, rash)     "Hear pulse in ear"  Protocols used: Sore Throat-A-AH

## 2021-09-11 NOTE — Progress Notes (Signed)
Pt scheduled  

## 2021-09-18 ENCOUNTER — Encounter: Payer: Self-pay | Admitting: Family Medicine

## 2021-10-08 NOTE — Telephone Encounter (Signed)
Appt with me when I'm back please

## 2021-10-08 NOTE — Telephone Encounter (Signed)
Your first available is 7/21, is that okay? OR do you want her to be fit in earlier?

## 2021-10-09 NOTE — Telephone Encounter (Signed)
Called patient to get her scheduled with Dr. Durenda Age first available appointment.

## 2021-11-21 ENCOUNTER — Other Ambulatory Visit: Payer: Self-pay | Admitting: Family Medicine

## 2021-11-21 NOTE — Telephone Encounter (Signed)
Requested medication (s) are due for refill today: Yes  Requested medication (s) are on the active medication list: Yes  Last refill:  08/08/21  Future visit scheduled: Yes  Notes to clinic:  See request.    Requested Prescriptions  Pending Prescriptions Disp Refills   tiZANidine (ZANAFLEX) 4 MG tablet [Pharmacy Med Name: tiZANidine HCL '4MG'$  TABLET] 180 tablet 1    Sig: TAKE ONE TABLET BY MOUTH EVERY 6 HOURS AS NEEDED FOR MUSCLE SPASMS     Not Delegated - Cardiovascular:  Alpha-2 Agonists - tizanidine Failed - 11/21/2021  6:22 AM      Failed - This refill cannot be delegated      Passed - Valid encounter within last 6 months    Recent Outpatient Visits           2 months ago Upper respiratory tract infection, unspecified type   [place], Megan P, DO   5 months ago [place], [place], DO   7 months ago [place], [place], DO   8 months ago [place], Megan P, DO   11 months ago Routine general medical examination at a health care facility   [place], [place], DO       Future Appointments             In 3 weeks Wynetta Emery, Barb Merino, DO [employer], [place]

## 2021-11-30 ENCOUNTER — Ambulatory Visit: Payer: Self-pay

## 2021-11-30 NOTE — Telephone Encounter (Signed)
Pt scheduled 9/7

## 2021-11-30 NOTE — Telephone Encounter (Signed)
Summary: Fibro myalgia pain and CSF   Fibro myalgia pain and CSF crash/ pt has been having headaches, low energy and facial pain which is typical but seems to be lingering around longer / pt asked to schedule appt for next Thursday / please advise      Called pt - LMOMTCB.

## 2021-11-30 NOTE — Telephone Encounter (Signed)
appt

## 2021-11-30 NOTE — Telephone Encounter (Signed)
  Chief Complaint: Long lasting Fibromyalgia  Symptoms: Fatigue, pain, HA no energy Frequency: Ongoing 3 months Pertinent Negatives: Patient denies  Disposition: '[]'$ ED /'[]'$ Urgent Care (no appt availability in office) / '[x]'$ Appointment(In office/virtual)/ '[]'$  [facility]/ '[]'$ Home Care/ '[]'$ Refused Recommended Disposition /'[]'$  Mobile Bus/ '[]'$  Follow-up with PCP Additional Notes: PT states that she has had 15-20 decent days in the past 3 months, and only 5 good days.  She states that this flare of fibromyalgia is very bad and she needs some help to get out of it.  She is hoping that Dr. Wynetta Emery will prescribe a round of prednisone.  PT also states that s/s of STI have returned and she would like a vaginal swab done at her upcoming appt.   Pt also recently bitten by a tick. Her husband was also bitten and he was diagnosed with rocky mountain spotted fever. Pt would like testing for same.  Pt has appt scheduled for Thursday  per her request.   Summary: Fibro myalgia pain and CSF   Fibro myalgia pain and CSF crash/ pt has been having headaches, low energy and facial pain which is typical but seems to be lingering around longer / pt asked to schedule appt for next Thursday / please advise      Reason for Disposition  [1] MODERATE pain (e.g., interferes with normal activities) AND [2] present > 3 days  Answer Assessment - Initial Assessment Questions 1. ONSET: "When did the muscle aches or body pains start?"      Years 2. LOCATION: "What part of your body is hurting?" (e.g., entire body, arms, legs)      Everywhere 3. SEVERITY: "How bad is the pain?" (Scale 1-10; or mild, moderate, severe)   - MILD (1-3): doesn't interfere with normal activities    - MODERATE (4-7): interferes with normal activities or awakens from sleep    - SEVERE (8-10):  excruciating pain, unable to do any normal activities      4-7 4. CAUSE: "What do you think is causing the pains?"     Fibromyalgia-  Crash 5. FEVER: "Have you been having fever?"     no 6. OTHER SYMPTOMS: "Do you have any other symptoms?" (e.g., chest pain, weakness, rash, cold or flu symptoms, weight loss)     CSF 7. PREGNANCY: "Is there any chance you are pregnant?" "When was your last menstrual period?"     *No Answer* 8. TRAVEL: "Have you traveled out of the country in the last month?" (e.g., travel history, exposures)     na  Protocols used: Muscle Aches and Body Pain-A-AH

## 2021-12-06 ENCOUNTER — Ambulatory Visit: Payer: Medicaid Other | Admitting: Family Medicine

## 2021-12-18 ENCOUNTER — Ambulatory Visit (INDEPENDENT_AMBULATORY_CARE_PROVIDER_SITE_OTHER): Payer: Self-pay | Admitting: Family Medicine

## 2021-12-18 ENCOUNTER — Encounter: Payer: Self-pay | Admitting: Family Medicine

## 2021-12-18 VITALS — BP 126/79 | HR 66 | Temp 98.4°F | Ht 62.0 in | Wt 236.4 lb

## 2021-12-18 DIAGNOSIS — W57XXXA Bitten or stung by nonvenomous insect and other nonvenomous arthropods, initial encounter: Secondary | ICD-10-CM

## 2021-12-18 DIAGNOSIS — Z1231 Encounter for screening mammogram for malignant neoplasm of breast: Secondary | ICD-10-CM

## 2021-12-18 DIAGNOSIS — Z0000 Encounter for general adult medical examination without abnormal findings: Secondary | ICD-10-CM

## 2021-12-18 DIAGNOSIS — N898 Other specified noninflammatory disorders of vagina: Secondary | ICD-10-CM

## 2021-12-18 LAB — WET PREP FOR TRICH, YEAST, CLUE
Clue Cell Exam: NEGATIVE
Trichomonas Exam: NEGATIVE
Yeast Exam: NEGATIVE

## 2021-12-18 LAB — MICROSCOPIC EXAMINATION: Bacteria, UA: NONE SEEN

## 2021-12-18 LAB — URINALYSIS, ROUTINE W REFLEX MICROSCOPIC
Bilirubin, UA: NEGATIVE
Glucose, UA: NEGATIVE
Leukocytes,UA: NEGATIVE
Nitrite, UA: NEGATIVE
Protein,UA: NEGATIVE
Specific Gravity, UA: 1.025 (ref 1.005–1.030)
Urobilinogen, Ur: 0.2 mg/dL (ref 0.2–1.0)
pH, UA: 6 (ref 5.0–7.5)

## 2021-12-18 NOTE — Patient Instructions (Signed)
Please call to schedule your mammogram and/or bone density: [facility]  Address: [address], [place], [place] [address] Phone: [phone]  [facility] [address] [place],  [place]  [address] Phone: [phone]   

## 2021-12-18 NOTE — Progress Notes (Signed)
BP 126/79   Pulse 66   Temp 98.4 F (36.9 C)   Ht _0  (1.575 m)   Wt 236 lb 6.4 oz (107.2 kg)   SpO2 98%   BMI 43.24 kg/m    Subjective:    Patient ID: Sydney Valenzuela, female    DOB: 24-Jul-1980, 41 y.o.   MRN: [id number]  HPI: Sydney Valenzuela is a 41 y.o. female presenting on 12/18/2021 for comprehensive medical examination. Current medical complaints include: Has been having a fibro flare and having a lot of issues with her mood.  She currently lives with: husband and kids Menopausal Symptoms: no  Depression Screen done today and results listed below:     12/18/2021    1:14 PM 09/10/2021   11:47 AM 06/01/2021   11:39 AM 04/03/2021    3:02 PM 03/05/2021    1:56 PM  Depression screen PHQ 2/9  Decreased Interest _1 Down, Depressed, Hopeless _2 PHQ - 2 Score _3 Altered sleeping _4 Tired, decreased energy _5 Change in appetite _6 Feeling bad or failure about yourself  _7 Trouble concentrating _8 Moving slowly or fidgety/restless _9 Suicidal thoughts _10 PHQ-9 Score _11 Difficult doing work/chores  Extremely dIfficult Extremely dIfficult  Extremely dIfficult    Past Medical History:  Past Medical History:  Diagnosis Date  . Allergy   . Anxiety   . Arthritis   . Family history of adverse reaction to anesthesia    N/V, difficult to wake up  . Hiatal hernia   . Hypertension     Surgical History:  Past Surgical History:  Procedure Laterality Date  . CHOLECYSTECTOMY N/A 11/21/2016   Procedure: LAPAROSCOPIC CHOLECYSTECTOMY WITH INTRAOPERATIVE CHOLANGIOGRAM;  Surgeon: Christene Lye, MD;  Location: [facility] ORS;  Service: General;  Laterality: N/A;  . ESOPHAGOGASTRODUODENOSCOPY (EGD) WITH PROPOFOL N/A 11/04/2016   Procedure: ESOPHAGOGASTRODUODENOSCOPY (EGD) WITH PROPOFOL;  Surgeon: Manya Silvas, MD;  Location: [facility] ENDOSCOPY;  Service: Endoscopy;  Laterality: N/A;  . LEEP     . NASAL SINUS SURGERY    . TONSILLECTOMY      Medications:  Current Outpatient Medications on File Prior to Visit  Medication Sig  . acetaminophen (TYLENOL) 500 MG tablet Take 500-1,000 mg by mouth daily as needed for moderate pain or headache.  . albuterol (PROVENTIL) (2.5 MG/3ML) 0.083% nebulizer solution Take 3 mLs (2.5 mg total) by nebulization every 6 (six) hours as needed for wheezing or shortness of breath.  Marland Kitchen albuterol (VENTOLIN HFA) 108 (90 Base) MCG/ACT inhaler Inhale 2 puffs into the lungs every 6 (six) hours as needed for wheezing or shortness of breath.  . ALPRAZolam (XANAX) 0.5 MG tablet Take 0.5 mg by mouth 2 (two) times daily as needed.  Marland Kitchen aspirin-acetaminophen-caffeine (EXCEDRIN MIGRAINE) 250-250-65 MG tablet Take by mouth every 6 (six) hours as needed for headache.  . Black Cohosh 40 MG CAPS Take by mouth.  . cetirizine (ZYRTEC) 10 MG tablet Take 1 tablet (10 mg total) by mouth at bedtime.  Marland Kitchen desvenlafaxine (PRISTIQ) 50 MG 24 hr tablet Take 1 tablet by mouth daily.  . diphenhydrAMINE HCl (BENADRYL ALLERGY PO) Take by mouth 2 (two) times daily.  . famotidine (PEPCID)  20 MG tablet Take 1 tablet (20 mg total) by mouth 2 (two) times daily.  . Ginger, Zingiber officinalis, (GINGER ROOT) 550 MG CAPS Take 550 mg by mouth 2 (two) times daily. TID in the A.M. TID in the P.M.  . glucosamine-chondroitin 500-400 MG tablet Take 1 tablet by mouth 3 (three) times daily.  Marland Kitchen lidocaine (XYLOCAINE) 2 % solution Use as directed 15 mLs in the mouth or throat every 4 (four) hours as needed for mouth pain.  . methocarbamol (ROBAXIN) 500 MG tablet Take 1 tablet (500 mg total) by mouth every 8 (eight) hours as needed for muscle spasms.  . Multiple Vitamins-Minerals (MULTIVITAMIN WITH MINERALS) tablet Take 1 tablet by mouth daily.  . Omega-3 Fatty Acids (FISH OIL) 1200 MG CPDR Take 1,200 mg by mouth at bedtime.  . pantoprazole (PROTONIX) 40 MG tablet Take 1 tablet (40 mg total) by mouth 2 (two)  times daily.  . prazosin (MINIPRESS) 1 MG capsule Take 1 mg by mouth at bedtime.  . prazosin (MINIPRESS) 5 MG capsule Take 5 mg by mouth at bedtime.  . pregabalin (LYRICA) 100 MG capsule Take 147m in the AM, 1545min the PM and 2 tabs at bedtime to be taken with her 5047mabs in the AM and PM for 150m41mtal  . pregabalin (LYRICA) 50 MG capsule Take 1 capsule (50 mg total) by mouth 2 (two) times daily. With her 100mg54m 150mg 67ml  . tiZANidine (ZANAFLEX) 4 MG tablet TAKE ONE TABLET BY MOUTH EVERY 6 HOURS AS NEEDED FOR MUSCLE SPASMS  . Turmeric (QC TUMERIC COMPLEX PO) Take 1,500 mg by mouth at bedtime.  . lamoTRIgine (LAMICTAL) 25 MG tablet Take by mouth. (Patient not taking: Reported on 12/18/2021)  . nicotine (NICODERM CQ) 21 mg/24hr patch Place 1 patch (21 mg total) onto the skin daily. (Patient not taking: Reported on 12/18/2021)   No current facility-administered medications on file prior to visit.    Allergies:  Allergies  Allergen Reactions  . Codeine Itching    Initial dose is the worst, but becomes more tolerable with more doses, severe itching and tongue swelling    Social History:  Social History   Socioeconomic History  . Marital status: Married    Spouse name: Not on file  . Number of children: Not on file  . Years of education: Not on file  . Highest education level: Not on file  Occupational History  . Not on file  Tobacco Use  . Smoking status: Former    Packs/day: 1.00    Types: Cigarettes  . Smokeless tobacco: Never  Vaping Use  . Vaping Use: Every day  Substance and Sexual Activity  . Alcohol use: Yes    Comment: occasional  . Drug use: No  . Sexual activity: Yes    Birth control/protection: I.U.D.  Other Topics Concern  . Not on file  Social History Narrative  . Not on file   Social Determinants of Health   Financial Resource Strain: Not on file  Food Insecurity: Not on file  Transportation Needs: Not on file  Physical Activity: Not on file   Stress: Not on file  Social Connections: Not on file  Intimate Partner Violence: Not on file   Social History   Tobacco Use  Smoking Status Former  . Packs/day: 1.00  . Types: Cigarettes  Smokeless Tobacco Never   Social History   Substance and Sexual Activity  Alcohol Use Yes   Comment: occasional    Family  History:  Family History  Problem Relation Age of Onset  . Hypertension Mother   . Allergies Mother   . Hyperlipidemia Mother   . Anxiety disorder Daughter   . Hypertension Son   . Cancer Maternal Uncle        prostate  . Diabetes Maternal Uncle   . Heart disease Maternal Uncle   . Allergies Maternal Grandmother   . COPD Maternal Grandmother   . Alzheimer's disease Maternal Grandmother   . Diabetes Maternal Grandfather   . Heart disease Maternal Grandfather     Past medical history, surgical history, medications, allergies, family history and social history reviewed with patient today and changes made to appropriate areas of the chart.   Review of Systems  Constitutional:  Positive for malaise/fatigue. Negative for chills, diaphoresis, fever and weight loss.  HENT: Negative.    Eyes: Negative.   Respiratory:  Negative for cough, hemoptysis, sputum production, shortness of breath and wheezing.   Cardiovascular:  Positive for palpitations and leg swelling. Negative for chest pain, orthopnea, claudication and PND.  Gastrointestinal:  Positive for constipation, diarrhea, heartburn, nausea and vomiting. Negative for abdominal pain, blood in stool and melena.  Genitourinary:  Positive for frequency. Negative for dysuria, flank pain, hematuria and urgency.  Musculoskeletal:  Positive for back pain, joint pain and myalgias. Negative for falls and neck pain.  Skin: Negative.   Neurological:  Positive for tingling and weakness. Negative for dizziness, tremors, sensory change, speech change, focal weakness, seizures, loss of consciousness and headaches.   Endo/Heme/Allergies: Negative.   Psychiatric/Behavioral:  Positive for depression. Negative for hallucinations, memory loss, substance abuse and suicidal ideas. The patient is nervous/anxious. The patient does not have insomnia.    All other ROS negative except what is listed above and in the HPI.      Objective:    BP 126/79   Pulse 66   Temp 98.4 F (36.9 C)   Ht _0  (1.575 m)   Wt 236 lb 6.4 oz (107.2 kg)   SpO2 98%   BMI 43.24 kg/m   Wt Readings from Last 3 Encounters:  12/18/21 236 lb 6.4 oz (107.2 kg)  06/01/21 237 lb 6.4 oz (107.7 kg)  04/03/21 225 lb 12.8 oz (102.4 kg)    Physical Exam  Results for orders placed or performed in visit on 11/30/20  WET PREP FOR [place], YEAST, CLUE   Specimen: Sterile Swab   Sterile Swab  Result Value Ref Range   Trichomonas Exam Positive (A) Negative   Yeast Exam Negative Negative   Clue Cell Exam Positive (A) Negative  Microscopic Examination   Urine  Result Value Ref Range   WBC, UA 0-5 0 - 5 /hpf   RBC, Urine None seen 0 - 2 /hpf   Epithelial Cells (non renal) 0-10 0 - 10 /hpf   Bacteria, UA Few (A) None seen/Few  CBC with Differential/Platelet  Result Value Ref Range   WBC 10.1 3.4 - 10.8 x10E3/uL   RBC 5.03 3.77 - 5.28 x10E6/uL   Hemoglobin 15.3 11.1 - 15.9 g/dL   Hematocrit 45.7 34.0 - 46.6 %   MCV 91 79 - 97 fL   MCH 30.4 26.6 - 33.0 pg   MCHC 33.5 31.5 - 35.7 g/dL   RDW 12.8 11.7 - 15.4 %   Platelets 223 150 - 450 x10E3/uL   Neutrophils 69 Not Estab. %   Lymphs 26 Not Estab. %   Monocytes 3 Not Estab. %   Eos 1  Not Estab. %   Basos 1 Not Estab. %   Neutrophils Absolute 7.0 1.4 - 7.0 x10E3/uL   Lymphocytes Absolute 2.6 0.7 - 3.1 x10E3/uL   Monocytes Absolute 0.3 0.1 - 0.9 x10E3/uL   EOS (ABSOLUTE) 0.1 0.0 - 0.4 x10E3/uL   Basophils Absolute 0.1 0.0 - 0.2 x10E3/uL   Immature Granulocytes 0 Not Estab. %   Immature Grans (Abs) 0.0 0.0 - 0.1 x10E3/uL  Comprehensive metabolic panel  Result Value Ref Range    Glucose 83 65 - 99 mg/dL   BUN 10 6 - 24 mg/dL   Creatinine, Ser 0.69 0.57 - 1.00 mg/dL   eGFR 112 >59 mL/min/1.73   BUN/Creatinine Ratio 14 9 - 23   Sodium 137 134 - 144 mmol/L   Potassium 4.0 3.5 - 5.2 mmol/L   Chloride 100 96 - 106 mmol/L   CO2 23 20 - 29 mmol/L   Calcium 9.5 8.7 - 10.2 mg/dL   Total Protein 6.5 6.0 - 8.5 g/dL   Albumin 4.6 3.8 - 4.8 g/dL   Globulin, Total 1.9 1.5 - 4.5 g/dL   Albumin/Globulin Ratio 2.4 (H) 1.2 - 2.2   Bilirubin Total 0.3 0.0 - 1.2 mg/dL   Alkaline Phosphatase 78 44 - 121 IU/L   AST 17 0 - 40 IU/L   ALT 23 0 - 32 IU/L  Lipid Panel w/o Chol/HDL Ratio  Result Value Ref Range   Cholesterol, Total 227 (H) 100 - 199 mg/dL   Triglycerides 181 (H) 0 - 149 mg/dL   HDL 63 >39 mg/dL   VLDL Cholesterol Cal 32 5 - 40 mg/dL   LDL Chol Calc (NIH) 132 (H) 0 - 99 mg/dL  Urinalysis, Routine w reflex microscopic  Result Value Ref Range   Specific Gravity, UA 1.010 1.005 - 1.030   pH, UA 6.5 5.0 - 7.5   Color, UA Yellow Yellow   Appearance Ur Clear Clear   Leukocytes,UA Trace (A) Negative   Protein,UA Negative Negative/Trace   Glucose, UA Negative Negative   Ketones, UA Negative Negative   RBC, UA Negative Negative   Bilirubin, UA Negative Negative   Urobilinogen, Ur 0.2 0.2 - 1.0 mg/dL   Nitrite, UA Negative Negative   Microscopic Examination See below:   TSH  Result Value Ref Range   TSH 2.110 0.450 - 4.500 uIU/mL  Cytology - PAP  Result Value Ref Range   High risk HPV Negative    Adequacy      Satisfactory for evaluation; transformation zone component PRESENT.   Diagnosis      - Negative for intraepithelial lesion or malignancy (NILM)   Microorganisms Trichomonas vaginalis present    Comment Normal Reference Range HPV - Negative       Assessment & Plan:   Problem List Items Addressed This Visit   None Visit Diagnoses     Routine general medical examination at a health care facility    -  Primary   Relevant Orders   CBC with  Differential/Platelet   Comprehensive metabolic panel   Lipid Panel w/o Chol/HDL Ratio   Urinalysis, Routine w reflex microscopic   TSH   Hepatitis C Antibody   HIV Antibody (routine testing w rflx)   Encounter for screening mammogram for malignant neoplasm of breast       Relevant Orders   MM 3D SCREEN BREAST BILATERAL        Follow up plan: No follow-ups on file.   LABORATORY TESTING:  - Pap smear: up to  date  IMMUNIZATIONS:   - Tdap: Tetanus vaccination status reviewed: last tetanus booster within 10 years. - Influenza: Refused - Pneumovax: Up to date - Prevnar: Not applicable - COVID: Refused - HPV: Refused - Shingrix vaccine: Not applicable  SCREENING: -Mammogram: Ordered today  - Colonoscopy: Not applicable   PATIENT COUNSELING:   Advised to take 1 mg of folate supplement per day if capable of pregnancy.   Sexuality: Discussed sexually transmitted diseases, partner selection, use of condoms, avoidance of unintended pregnancy  and contraceptive alternatives.   Advised to avoid cigarette smoking.  I discussed with the patient that most people either abstain from alcohol or drink within safe limits (<=14/week and <=4 drinks/occasion for males, <=7/weeks and <= 3 drinks/occasion for females) and that the risk for alcohol disorders and other health effects rises proportionally with the number of drinks per week and how often a drinker exceeds daily limits.  Discussed cessation/primary prevention of drug use and availability of treatment for abuse.   Diet: Encouraged to adjust caloric intake to maintain  or achieve ideal body weight, to reduce intake of dietary saturated fat and total fat, to limit sodium intake by avoiding high sodium foods and not adding table salt, and to maintain adequate dietary potassium and calcium preferably from fresh fruits, vegetables, and low-fat dairy products.    stressed the importance of regular exercise  Injury prevention: Discussed  safety belts, safety helmets, smoke detector, smoking near bedding or upholstery.   Dental health: Discussed importance of regular tooth brushing, flossing, and dental visits.    NEXT PREVENTATIVE PHYSICAL DUE IN 1 YEAR. No follow-ups on file.

## 2021-12-19 ENCOUNTER — Encounter: Payer: Self-pay | Admitting: Family Medicine

## 2021-12-22 LAB — COMPREHENSIVE METABOLIC PANEL
ALT: 16 IU/L (ref 0–32)
AST: 13 IU/L (ref 0–40)
Albumin/Globulin Ratio: 2.3 — ABNORMAL HIGH (ref 1.2–2.2)
Albumin: 4.6 g/dL (ref 3.9–4.9)
Alkaline Phosphatase: 111 IU/L (ref 44–121)
BUN/Creatinine Ratio: 12 (ref 9–23)
BUN: 10 mg/dL (ref 6–24)
Bilirubin Total: 0.3 mg/dL (ref 0.0–1.2)
CO2: 22 mmol/L (ref 20–29)
Calcium: 9.3 mg/dL (ref 8.7–10.2)
Chloride: 101 mmol/L (ref 96–106)
Creatinine, Ser: 0.81 mg/dL (ref 0.57–1.00)
Globulin, Total: 2 g/dL (ref 1.5–4.5)
Glucose: 83 mg/dL (ref 70–99)
Potassium: 3.9 mmol/L (ref 3.5–5.2)
Sodium: 139 mmol/L (ref 134–144)
Total Protein: 6.6 g/dL (ref 6.0–8.5)
eGFR: 93 mL/min/{1.73_m2} (ref >59)

## 2021-12-22 LAB — CBC WITH DIFFERENTIAL/PLATELET
Basophils Absolute: 0 10*3/uL (ref 0.0–0.2)
Basos: 0 %
EOS (ABSOLUTE): 0.1 10*3/uL (ref 0.0–0.4)
Eos: 1 %
Hematocrit: 39.2 % (ref 34.0–46.6)
Hemoglobin: 13 g/dL (ref 11.1–15.9)
Immature Grans (Abs): 0 10*3/uL (ref 0.0–0.1)
Immature Granulocytes: 0 %
Lymphocytes Absolute: 2 10*3/uL (ref 0.7–3.1)
Lymphs: 28 %
MCH: 29.5 pg (ref 26.6–33.0)
MCHC: 33.2 g/dL (ref 31.5–35.7)
MCV: 89 fL (ref 79–97)
Monocytes Absolute: 0.3 10*3/uL (ref 0.1–0.9)
Monocytes: 4 %
Neutrophils Absolute: 4.8 10*3/uL (ref 1.4–7.0)
Neutrophils: 67 %
Platelets: 254 10*3/uL (ref 150–450)
RBC: 4.41 x10E6/uL (ref 3.77–5.28)
RDW: 12.5 % (ref 11.7–15.4)
WBC: 7.2 10*3/uL (ref 3.4–10.8)

## 2021-12-22 LAB — LIPID PANEL W/O CHOL/HDL RATIO
Cholesterol, Total: 261 mg/dL — ABNORMAL HIGH (ref 100–199)
HDL: 61 mg/dL (ref >39)
LDL Chol Calc (NIH): 152 mg/dL — ABNORMAL HIGH (ref 0–99)
Triglycerides: 263 mg/dL — ABNORMAL HIGH (ref 0–149)
VLDL Cholesterol Cal: 48 mg/dL — ABNORMAL HIGH (ref 5–40)

## 2021-12-22 LAB — BABESIA MICROTI ANTIBODY PANEL
Babesia microti IgG: <1:10 {titer}
Babesia microti IgM: <1:10 {titer}

## 2021-12-22 LAB — EHRLICHIA ANTIBODY PANEL
E. Chaffeensis (HME) IgM Titer: NEGATIVE
E.Chaffeensis (HME) IgG: NEGATIVE
HGE IgG Titer: NEGATIVE
HGE IgM Titer: NEGATIVE

## 2021-12-22 LAB — HIV ANTIBODY (ROUTINE TESTING W REFLEX): HIV Screen 4th Generation wRfx: NONREACTIVE

## 2021-12-22 LAB — TSH: TSH: 1.58 u[IU]/mL (ref 0.450–4.500)

## 2021-12-22 LAB — ROCKY MTN SPOTTED FVR ABS PNL(IGG+IGM)
RMSF IgG: NEGATIVE
RMSF IgM: 0.27 index (ref 0.00–0.89)

## 2021-12-22 LAB — HEPATITIS C ANTIBODY: Hep C Virus Ab: NONREACTIVE

## 2021-12-22 LAB — LYME DISEASE SEROLOGY W/REFLEX: Lyme Total Antibody EIA: NEGATIVE

## 2022-03-01 ENCOUNTER — Other Ambulatory Visit: Payer: Self-pay | Admitting: Family Medicine

## 2022-03-01 NOTE — Telephone Encounter (Signed)
Requested Prescriptions  Pending Prescriptions Disp Refills   pantoprazole (PROTONIX) 40 MG tablet [Pharmacy Med Name: PANTOPRAZOLE SOD DR 40 MG TAB] 180 tablet 0    Sig: TAKE ONE TABLET BY MOUTH TWICE A DAY     Gastroenterology: Proton Pump Inhibitors Passed - 03/01/2022  1:15 PM      Passed - Valid encounter within last 12 months    Recent Outpatient Visits           2 months ago Routine general medical examination at a health care facility   [facility], Garrett P, DO   5 months ago Upper respiratory tract infection, unspecified type   [facility], Megan P, DO   9 months ago [place], [place], DO   11 months ago [place], [place], DO   12 months ago [place], [place], DO

## 2022-03-11 DIAGNOSIS — F4312 Post-traumatic stress disorder, chronic: Secondary | ICD-10-CM | POA: Diagnosis not present

## 2022-03-11 DIAGNOSIS — F411 Generalized anxiety disorder: Secondary | ICD-10-CM | POA: Diagnosis not present

## 2022-03-11 DIAGNOSIS — F32A Depression, unspecified: Secondary | ICD-10-CM | POA: Diagnosis not present

## 2022-03-12 ENCOUNTER — Encounter: Payer: Self-pay | Admitting: Family Medicine

## 2022-03-14 MED ORDER — NICOTINE 14 MG/24HR TD PT24: 14.0000 mg | MEDICATED_PATCH | Freq: Every day | TRANSDERMAL | 0 refills | Status: DC

## 2022-03-14 MED ORDER — NICOTINE 7 MG/24HR TD PT24: 7.0000 mg | MEDICATED_PATCH | Freq: Every day | TRANSDERMAL | 0 refills | Status: DC

## 2022-04-03 DIAGNOSIS — F39 Unspecified mood [affective] disorder: Secondary | ICD-10-CM | POA: Diagnosis not present

## 2022-04-03 DIAGNOSIS — F0630 Mood disorder due to known physiological condition, unspecified: Secondary | ICD-10-CM | POA: Diagnosis not present

## 2022-04-03 DIAGNOSIS — F411 Generalized anxiety disorder: Secondary | ICD-10-CM | POA: Diagnosis not present

## 2022-04-03 DIAGNOSIS — F4312 Post-traumatic stress disorder, chronic: Secondary | ICD-10-CM | POA: Diagnosis not present

## 2022-04-10 ENCOUNTER — Other Ambulatory Visit: Payer: Self-pay | Admitting: Family Medicine

## 2022-04-10 NOTE — Telephone Encounter (Signed)
Requested medication (s) are due for refill today: routing for approval of zanaflex  Requested medication (s) are on the active medication list: yes for zanaflex  Last refill:  11/22/21,zanaflex  Future visit scheduled:yes  Notes to clinic:  Unable to refill per protocol, Rx expired for Nicoderm CQ. Zanaflex can not be deleigated     Requested Prescriptions  Pending Prescriptions Disp Refills   nicotine (NICODERM CQ - DOSED IN MG/24 HOURS) 21 mg/24hr patch [Pharmacy Med Name: NICOTINE 21 MG/24HR PATCH] 28 patch 0    Sig: PLACE 1 PATCH ONTO SKIN DAILY     Psychiatry:  Drug Dependence Therapy Passed - 04/10/2022 12:36 PM      Passed - Valid encounter within last 12 months    Recent Outpatient Visits           3 months ago Routine general medical examination at a health care facility   [facility], [place], DO   7 months ago Upper respiratory tract infection, unspecified type   [place], Megan P, DO   10 months ago [place], [place], DO   1 year ago [place], [place], DO   1 year ago Fibromyalgia   [place], Megan P, DO               tiZANidine (ZANAFLEX) 4 MG tablet [Pharmacy Med Name: tiZANidine HCL '4MG'$  TABLET] 136 tablet     Sig: TAKE ONE TABLET BY MOUTH EVERY 6 HOURS AS NEEDED FOR MUSCLE SPASMS     Not Delegated - Cardiovascular:  Alpha-2 Agonists - tizanidine Failed - 04/10/2022 12:36 PM      Failed - This refill cannot be delegated      Passed - Valid encounter within last 6 months    Recent Outpatient Visits           3 months ago Routine general medical examination at a health care facility   [facility], [place], DO   7 months ago Upper respiratory tract infection, unspecified type   [place], Megan P, DO   10 months ago [place], [place], DO    1 year ago [place], [place], DO   1 year ago Fibromyalgia   [place], Megan P, DO              Refused Prescriptions Disp Refills   pantoprazole (PROTONIX) 40 MG tablet [Pharmacy Med Name: PANTOPRAZOLE SOD DR 40 MG TAB] 180 tablet 0    Sig: TAKE 1 TABLET BY MOUTH TWICE A DAY     Gastroenterology: Proton Pump Inhibitors Passed - 04/10/2022 12:36 PM      Passed - Valid encounter within last 12 months    Recent Outpatient Visits           3 months ago Routine general medical examination at a health care facility   [facility], [place], DO   7 months ago Upper respiratory tract infection, unspecified type   [place], Megan P, DO   10 months ago [place], [place], DO   1 year ago [place], DO   1 year ago [place], [place], DO

## 2022-04-10 NOTE — Telephone Encounter (Signed)
Unable to refill per protocol, Rx expired. Medication was discontinued 03/14/22. Will refuse.  Requested Prescriptions  Pending Prescriptions Disp Refills   nicotine (NICODERM CQ - DOSED IN MG/24 HOURS) 21 mg/24hr patch [Pharmacy Med Name: NICOTINE 21 MG/24HR PATCH] 28 patch 0    Sig: PLACE 1 PATCH ONTO SKIN DAILY     Psychiatry:  Drug Dependence Therapy Passed - 04/10/2022 12:36 PM      Passed - Valid encounter within last 12 months    Recent Outpatient Visits           3 months ago Routine general medical examination at a health care facility   [facility], [place], DO   7 months ago Upper respiratory tract infection, unspecified type   [place], Megan P, DO   10 months ago [place], [place], DO   1 year ago [place], [place], DO   1 year ago Fibromyalgia   [place], Megan P, DO               tiZANidine (ZANAFLEX) 4 MG tablet [Pharmacy Med Name: tiZANidine HCL '4MG'$  TABLET] 136 tablet     Sig: TAKE ONE TABLET BY MOUTH EVERY 6 HOURS AS NEEDED FOR MUSCLE SPASMS     Not Delegated - Cardiovascular:  Alpha-2 Agonists - tizanidine Failed - 04/10/2022 12:36 PM      Failed - This refill cannot be delegated      Passed - Valid encounter within last 6 months    Recent Outpatient Visits           3 months ago Routine general medical examination at a health care facility   [facility], [place], DO   7 months ago Upper respiratory tract infection, unspecified type   [place], Megan P, DO   10 months ago [place], [place], DO   1 year ago [place], [place], DO   1 year ago [place], Megan P, DO               pantoprazole (PROTONIX) 40 MG tablet [Pharmacy Med Name: PANTOPRAZOLE SOD DR  40 MG TAB] 180 tablet 0    Sig: TAKE 1 TABLET BY MOUTH TWICE A DAY     Gastroenterology: Proton Pump Inhibitors Passed - 04/10/2022 12:36 PM      Passed - Valid encounter within last 12 months    Recent Outpatient Visits           3 months ago Routine general medical examination at a health care facility   [facility], [place], DO   7 months ago Upper respiratory tract infection, unspecified type   [place], Megan P, DO   10 months ago [place], [place], DO   1 year ago [place], [place], DO   1 year ago [place], [place], DO

## 2022-04-11 ENCOUNTER — Other Ambulatory Visit: Payer: Self-pay | Admitting: Family Medicine

## 2022-04-12 NOTE — Telephone Encounter (Signed)
Requested medications are due for refill today.  unsure  Requested medications are on the active medications list.  yes  Last refill. 11/22/2021 #180 1 rf  Future visit scheduled.   no  Notes to clinic.  Refill not delegated.    Requested Prescriptions  Pending Prescriptions Disp Refills   tiZANidine (ZANAFLEX) 4 MG tablet [Pharmacy Med Name: tiZANidine HCL '4MG'$  TABLET] 136 tablet     Sig: TAKE ONE TABLET BY MOUTH EVERY 6 HOURS AS NEEDED FOR MUSCLE SPASMS     Not Delegated - Cardiovascular:  Alpha-2 Agonists - tizanidine Failed - 04/11/2022  4:30 PM      Failed - This refill cannot be delegated      Passed - Valid encounter within last 6 months    Recent Outpatient Visits           3 months ago Routine general medical examination at a health care facility   [facility], [place], DO   7 months ago Upper respiratory tract infection, unspecified type   [place], Megan P, DO   10 months ago [place], [place], DO   1 year ago [place], [place], DO   1 year ago [place], [place], DO

## 2022-04-23 ENCOUNTER — Encounter: Payer: Self-pay | Admitting: Family Medicine

## 2022-04-28 MED ORDER — NICOTINE 21 MG/24HR TD PT24: 21.0000 mg | MEDICATED_PATCH | Freq: Every day | TRANSDERMAL | 3 refills | Status: DC

## 2022-04-29 DIAGNOSIS — F39 Unspecified mood [affective] disorder: Secondary | ICD-10-CM | POA: Diagnosis not present

## 2022-04-29 DIAGNOSIS — F4312 Post-traumatic stress disorder, chronic: Secondary | ICD-10-CM | POA: Diagnosis not present

## 2022-04-29 DIAGNOSIS — F411 Generalized anxiety disorder: Secondary | ICD-10-CM | POA: Diagnosis not present

## 2022-04-30 ENCOUNTER — Other Ambulatory Visit: Payer: Self-pay | Admitting: Family Medicine

## 2022-05-01 MED ORDER — PANTOPRAZOLE SODIUM 40 MG PO TBEC: 40.0000 mg | DELAYED_RELEASE_TABLET | Freq: Two times a day (BID) | ORAL | 0 refills | Status: DC

## 2022-05-01 NOTE — Telephone Encounter (Signed)
Pt advised pharmacy that she lost her Rx for Pantoprazole recently during a move / se is still in the process of moving and hasn't been able to locate medication anywhere / pt asked if she can get a short supply refill until she can see Dr. Wynetta Emery on 2.8.24/ please call pt to advise   [facility] [phone] Lorina Rabon, [place]

## 2022-05-01 NOTE — Telephone Encounter (Signed)
Requested Prescriptions  Pending Prescriptions Disp Refills   pantoprazole (PROTONIX) 40 MG tablet 60 tablet 0    Sig: Take 1 tablet (40 mg total) by mouth 2 (two) times daily.     Gastroenterology: Proton Pump Inhibitors Passed - 05/01/2022 10:48 AM      Passed - Valid encounter within last 12 months    Recent Outpatient Visits           4 months ago Routine general medical examination at a health care facility   [facility], [place] P, DO   7 months ago Upper respiratory tract infection, unspecified type   [place], [place], DO   11 months ago [place], [place], DO   1 year ago [place], [place], DO   1 year ago [place], Barb Merino, DO       Future Appointments             In 1 week Wynetta Emery, Barb Merino, DO [place], [facility]            Refused Prescriptions Disp Refills   pantoprazole (PROTONIX) 40 MG tablet [Pharmacy Med Name: PANTOPRAZOLE SOD DR 40 MG TAB] 180 tablet 0    Sig: TAKE 1 TABLET BY MOUTH TWICE A DAY     Gastroenterology: Proton Pump Inhibitors Passed - 05/01/2022 10:48 AM      Passed - Valid encounter within last 12 months    Recent Outpatient Visits           4 months ago Routine general medical examination at a health care facility   [facility], [place] P, DO   7 months ago Upper respiratory tract infection, unspecified type   [place], Megan P, DO   11 months ago [place], [place], DO   1 year ago [place], [place], DO   1 year ago [place], Barb Merino, DO       Future Appointments             In 1 week  Wynetta Emery, Barb Merino, DO [place], [facility]

## 2022-05-01 NOTE — Telephone Encounter (Signed)
Unable to refill per protocol, Rx request is too soon. Last refill 03/01/22 for 90 days.  Requested Prescriptions  Pending Prescriptions Disp Refills   pantoprazole (PROTONIX) 40 MG tablet [Pharmacy Med Name: PANTOPRAZOLE SOD DR 40 MG TAB] 180 tablet 0    Sig: TAKE 1 TABLET BY MOUTH TWICE A DAY     Gastroenterology: Proton Pump Inhibitors Passed - 04/30/2022  3:59 PM      Passed - Valid encounter within last 12 months    Recent Outpatient Visits           4 months ago Routine general medical examination at a health care facility   [facility], [place], DO   7 months ago Upper respiratory tract infection, unspecified type   [place], [place], DO   11 months ago [place], [place], DO   1 year ago [place], [place], DO   1 year ago [place], [place], DO

## 2022-05-01 NOTE — Addendum Note (Signed)
Addended by: Matilde Sprang on: 05/01/2022 10:48 AM   Modules accepted: Orders

## 2022-05-09 ENCOUNTER — Encounter: Payer: Self-pay | Admitting: Family Medicine

## 2022-05-09 ENCOUNTER — Ambulatory Visit: Payer: Medicaid Other | Admitting: Family Medicine

## 2022-05-09 VITALS — BP 106/71 | HR 72 | Temp 98.8°F | Ht 62.0 in | Wt 223.5 lb

## 2022-05-09 DIAGNOSIS — F419 Anxiety disorder, unspecified: Secondary | ICD-10-CM | POA: Diagnosis not present

## 2022-05-09 DIAGNOSIS — F17200 Nicotine dependence, unspecified, uncomplicated: Secondary | ICD-10-CM | POA: Diagnosis not present

## 2022-05-09 DIAGNOSIS — F322 Major depressive disorder, single episode, severe without psychotic features: Secondary | ICD-10-CM

## 2022-05-09 DIAGNOSIS — M797 Fibromyalgia: Secondary | ICD-10-CM | POA: Diagnosis not present

## 2022-05-09 DIAGNOSIS — Z1231 Encounter for screening mammogram for malignant neoplasm of breast: Secondary | ICD-10-CM | POA: Diagnosis not present

## 2022-05-09 NOTE — Progress Notes (Signed)
BP 106/71   Pulse 72   Temp 98.8 F (37.1 C) (Oral)   Ht 5' 2"$  (1.575 m)   Wt 223 lb 8 oz (101.4 kg)   SpO2 98%   BMI 40.88 kg/m    Subjective:    Patient ID: Sydney Valenzuela, female    DOB: [birth year], 42 y.o.   MRN: [id number]  HPI: Sydney Valenzuela is a 42 y.o. female  Chief Complaint  Patient presents with   Anxiety   Nicotine Dependence   Fibromyalgia   Has been having continued issues with bedbugs. Has moved out of her home. Has moved in with her daughter. She notes that she had been under a huge amount of stress. She notes that she has been under a huge amount of stress and has slipped on her smoking.   SMOKING CESSATION Smoking Status: current every day smoker Smoking Amount: >ppd Smoking Onset: 30s Smoking Quit Date: middle of December  Smoking triggers: stress Type of tobacco use: cigarettes Children in the house: yes Other household members who smoke: yes Treatments attempted: nicoderm patches and lozenges  Had her disability hearing and has been working with her insurance.   FIBROMYALGIA Pain status: exacerbated Satisfied with current treatment?: no Medication side effects: yes Medication compliance: excellent compliance Duration: chronic Location: widespread Quality: aching and sore Current pain level: severe Previous pain level: moderate Aggravating factors: stress, moving Alleviating factors: medication, rest Previous pain specialty evaluation: no Non-narcotic analgesic meds: yes Narcotic contract:no Treatments attempted:  lyrica, rest, ice, heat, APAP, ibuprofen, and aleve     Relevant past medical, surgical, family and social history reviewed and updated as indicated. Interim medical history since our last visit reviewed. Allergies and medications reviewed and updated.  Review of Systems  Constitutional: Negative.   Respiratory: Negative.    Cardiovascular: Negative.   Gastrointestinal: Negative.   Genitourinary: Negative.    Musculoskeletal:  Positive for arthralgias, back pain, myalgias, neck pain and neck stiffness. Negative for gait problem and joint swelling.  Skin: Negative.   Neurological: Negative.   Psychiatric/Behavioral:  Positive for dysphoric mood. Negative for agitation, behavioral problems, confusion, decreased concentration, hallucinations, self-injury, sleep disturbance and suicidal ideas. The patient is nervous/anxious. The patient is not hyperactive.     Per HPI unless specifically indicated above     Objective:    BP 106/71   Pulse 72   Temp 98.8 F (37.1 C) (Oral)   Ht 5' 2"$  (1.575 m)   Wt 223 lb 8 oz (101.4 kg)   SpO2 98%   BMI 40.88 kg/m   Wt Readings from Last 3 Encounters:  05/09/22 223 lb 8 oz (101.4 kg)  12/18/21 236 lb 6.4 oz (107.2 kg)  06/01/21 237 lb 6.4 oz (107.7 kg)    Physical Exam Vitals and nursing note reviewed.  Constitutional:      General: She is not in acute distress.    Appearance: Normal appearance. She is obese. She is not ill-appearing, toxic-appearing or diaphoretic.  HENT:     Head: Normocephalic and atraumatic.     Right Ear: External ear normal.     Left Ear: External ear normal.     Nose: Nose normal.     Mouth/Throat:     Mouth: Mucous membranes are moist.     Pharynx: Oropharynx is clear.  Eyes:     General: No scleral icterus.       Right eye: No discharge.        Left eye: No  discharge.     Extraocular Movements: Extraocular movements intact.     Conjunctiva/sclera: Conjunctivae normal.     Pupils: Pupils are equal, round, and reactive to light.  Cardiovascular:     Rate and Rhythm: Normal rate and regular rhythm.     Pulses: Normal pulses.     Heart sounds: Normal heart sounds. No murmur heard.    No friction rub. No gallop.  Pulmonary:     Effort: Pulmonary effort is normal. No respiratory distress.     Breath sounds: Normal breath sounds. No stridor. No wheezing, rhonchi or rales.  Chest:     Chest wall: No tenderness.   Musculoskeletal:        General: Normal range of motion.     Cervical back: Normal range of motion and neck supple.  Skin:    General: Skin is warm and dry.     Capillary Refill: Capillary refill takes less than 2 seconds.     Coloration: Skin is not jaundiced or pale.     Findings: No bruising, erythema, lesion or rash.  Neurological:     General: No focal deficit present.     Mental Status: She is alert and oriented to person, place, and time. Mental status is at baseline.  Psychiatric:        Mood and Affect: Mood normal.        Behavior: Behavior normal.        Thought Content: Thought content normal.        Judgment: Judgment normal.     Results for orders placed or performed in visit on 12/18/21  WET PREP FOR [place], YEAST, CLUE   Specimen: Sterile Swab   Sterile Swab  Result Value Ref Range   Trichomonas Exam Negative Negative   Yeast Exam Negative Negative   Clue Cell Exam Negative Negative  Microscopic Examination   Urine  Result Value Ref Range   WBC, UA 0-5 0 - 5 /hpf   RBC, Urine 0-2 0 - 2 /hpf   Epithelial Cells (non renal) 0-10 0 - 10 /hpf   Mucus, UA Present (A) Not Estab.   Bacteria, UA None seen None seen/Few  CBC with Differential/Platelet  Result Value Ref Range   WBC 7.2 3.4 - 10.8 x10E3/uL   RBC 4.41 3.77 - 5.28 x10E6/uL   Hemoglobin 13.0 11.1 - 15.9 g/dL   Hematocrit 39.2 34.0 - 46.6 %   MCV 89 79 - 97 fL   MCH 29.5 26.6 - 33.0 pg   MCHC 33.2 31.5 - 35.7 g/dL   RDW 12.5 11.7 - 15.4 %   Platelets 254 150 - 450 x10E3/uL   Neutrophils 67 Not Estab. %   Lymphs 28 Not Estab. %   Monocytes 4 Not Estab. %   Eos 1 Not Estab. %   Basos 0 Not Estab. %   Neutrophils Absolute 4.8 1.4 - 7.0 x10E3/uL   Lymphocytes Absolute 2.0 0.7 - 3.1 x10E3/uL   Monocytes Absolute 0.3 0.1 - 0.9 x10E3/uL   EOS (ABSOLUTE) 0.1 0.0 - 0.4 x10E3/uL   Basophils Absolute 0.0 0.0 - 0.2 x10E3/uL   Immature Granulocytes 0 Not Estab. %   Immature Grans (Abs) 0.0 0.0 - 0.1  x10E3/uL  Comprehensive metabolic panel  Result Value Ref Range   Glucose 83 70 - 99 mg/dL   BUN 10 6 - 24 mg/dL   Creatinine, Ser 0.81 0.57 - 1.00 mg/dL   eGFR 93 >59 mL/min/1.73   BUN/Creatinine Ratio 12 9 -  23   Sodium 139 134 - 144 mmol/L   Potassium 3.9 3.5 - 5.2 mmol/L   Chloride 101 96 - 106 mmol/L   CO2 22 20 - 29 mmol/L   Calcium 9.3 8.7 - 10.2 mg/dL   Total Protein 6.6 6.0 - 8.5 g/dL   Albumin 4.6 3.9 - 4.9 g/dL   Globulin, Total 2.0 1.5 - 4.5 g/dL   Albumin/Globulin Ratio 2.3 (H) 1.2 - 2.2   Bilirubin Total 0.3 0.0 - 1.2 mg/dL   Alkaline Phosphatase 111 44 - 121 IU/L   AST 13 0 - 40 IU/L   ALT 16 0 - 32 IU/L  Lipid Panel w/o Chol/HDL Ratio  Result Value Ref Range   Cholesterol, Total 261 (H) 100 - 199 mg/dL   Triglycerides 263 (H) 0 - 149 mg/dL   HDL 61 >39 mg/dL   VLDL Cholesterol Cal 48 (H) 5 - 40 mg/dL   LDL Chol Calc (NIH) 152 (H) 0 - 99 mg/dL  Urinalysis, Routine w reflex microscopic  Result Value Ref Range   Specific Gravity, UA 1.025 1.005 - 1.030   pH, UA 6.0 5.0 - 7.5   Color, UA Yellow Yellow   Appearance Ur Clear Clear   Leukocytes,UA Negative Negative   Protein,UA Negative Negative/Trace   Glucose, UA Negative Negative   Ketones, UA 1+ (A) Negative   RBC, UA 2+ (A) Negative   Bilirubin, UA Negative Negative   Urobilinogen, Ur 0.2 0.2 - 1.0 mg/dL   Nitrite, UA Negative Negative   Microscopic Examination See below:   TSH  Result Value Ref Range   TSH 1.580 0.450 - 4.500 uIU/mL  Hepatitis C Antibody  Result Value Ref Range   Hep C Virus Ab Non Reactive Non Reactive  HIV Antibody (routine testing w rflx)  Result Value Ref Range   HIV Screen 4th Generation wRfx Non Reactive Non Reactive  Lyme Disease Serology w/Reflex  Result Value Ref Range   Lyme Total Antibody EIA Negative Negative  Babesia microti Antibody Panel  Result Value Ref Range   Babesia microti IgG <1:10 Neg:<1:10   Babesia microti IgM <1:10 Neg:<1:10   Result Comment: Comment    Rocky mtn spotted fvr abs pnl(IgG+IgM)  Result Value Ref Range   RMSF IgG Negative Negative   RMSF IgM 0.27 0.00 - 123456 index  Ehrlichia Antibody Panel  Result Value Ref Range   E.Chaffeensis (HME) IgG Negative Neg:<1:64   E. Chaffeensis (HME) IgM Titer Negative Neg:<1:20   HGE IgG Titer Negative Neg:<1:64   HGE IgM Titer Negative Neg:<1:20   Result Comment: Comment       Assessment & Plan:   Problem List Items Addressed This Visit       Other   Smoker    Doing well on nicoderm patch- does need longer on the higher dose. Refills given. Call with any concerns.       Depression, major, single episode, severe ([place])    Not under great control. Continue to follow with psychiatry. Continue to monitor. Call with any concerns.       Relevant Medications   desvenlafaxine (PRISTIQ) 100 MG 24 hr tablet   Anxiety    Not under great control. Continue to follow with psychiatry. Continue to monitor. Call with any concerns.       Relevant Medications   desvenlafaxine (PRISTIQ) 100 MG 24 hr tablet   Fibromyalgia - Primary    Not under good control. Will get her into PT and pain management to  see if they can help get her under better control. Await their input. Refills given. Call with any concerns.       Relevant Medications   desvenlafaxine (PRISTIQ) 100 MG 24 hr tablet   lamoTRIgine (LAMICTAL) 200 MG tablet   tiZANidine (ZANAFLEX) 4 MG tablet   methocarbamol (ROBAXIN) 500 MG tablet   pregabalin (LYRICA) 100 MG capsule   pregabalin (LYRICA) 50 MG capsule   Other Relevant Orders   Ambulatory referral to Pain Clinic   Ambulatory referral to Physical Therapy   Other Visit Diagnoses     Encounter for screening mammogram for malignant neoplasm of breast       Mammogram ordered today. Await results.   Relevant Orders   MM 3D SCREEN BREAST BILATERAL        Follow up plan: Return in about 3 months (around 08/07/2022).

## 2022-05-09 NOTE — Patient Instructions (Signed)
Please call to schedule your mammogram and/or bone density: [facility]  Address: [address], [place], [place] [address] Phone: [phone]  [facility] [place] [address] [place],  [place]  [address] Phone: [phone]   

## 2022-05-12 MED ORDER — METHOCARBAMOL 500 MG PO TABS: 500.0000 mg | ORAL_TABLET | Freq: Three times a day (TID) | ORAL | 3 refills | Status: DC | PRN

## 2022-05-12 MED ORDER — PREGABALIN 100 MG PO CAPS: ORAL_CAPSULE | ORAL | 5 refills | Status: DC

## 2022-05-12 MED ORDER — PANTOPRAZOLE SODIUM 40 MG PO TBEC: 40.0000 mg | DELAYED_RELEASE_TABLET | Freq: Two times a day (BID) | ORAL | 1 refills | Status: DC

## 2022-05-12 MED ORDER — PREGABALIN 50 MG PO CAPS: 50.0000 mg | ORAL_CAPSULE | Freq: Two times a day (BID) | ORAL | 5 refills | Status: DC

## 2022-05-12 MED ORDER — TIZANIDINE HCL 4 MG PO TABS: 4.0000 mg | ORAL_TABLET | Freq: Four times a day (QID) | ORAL | 3 refills | Status: DC | PRN

## 2022-05-12 MED ORDER — CICLOPIROX 8 % EX SOLN: Freq: Every day | CUTANEOUS | 3 refills | Status: DC

## 2022-05-12 MED ORDER — ALBUTEROL SULFATE HFA 108 (90 BASE) MCG/ACT IN AERS: 2.0000 | INHALATION_SPRAY | Freq: Four times a day (QID) | RESPIRATORY_TRACT | 6 refills | Status: DC | PRN

## 2022-05-12 MED ORDER — CETIRIZINE HCL 10 MG PO TABS: 10.0000 mg | ORAL_TABLET | Freq: Every day | ORAL | 4 refills | Status: DC

## 2022-05-12 NOTE — Assessment & Plan Note (Signed)
Not under great control. Continue to follow with psychiatry. Continue to monitor. Call with any concerns.

## 2022-05-12 NOTE — Assessment & Plan Note (Signed)
Not under good control. Will get her into PT and pain management to see if they can help get her under better control. Await their input. Refills given. Call with any concerns.

## 2022-05-12 NOTE — Assessment & Plan Note (Signed)
Doing well on nicoderm patch- does need longer on the higher dose. Refills given. Call with any concerns.

## 2022-05-13 ENCOUNTER — Telehealth: Payer: Self-pay

## 2022-05-13 MED ORDER — NICOTINE POLACRILEX 2 MG MT LOZG: 2.0000 mg | LOZENGE | OROMUCOSAL | 3 refills | Status: DC | PRN

## 2022-05-13 NOTE — Telephone Encounter (Signed)
Handicap Placard have been completed and printed and placed in provider's folder for signature.

## 2022-05-13 NOTE — Telephone Encounter (Signed)
OK to write up handicap placard for 6 months

## 2022-05-20 DIAGNOSIS — F411 Generalized anxiety disorder: Secondary | ICD-10-CM | POA: Diagnosis not present

## 2022-05-20 DIAGNOSIS — F39 Unspecified mood [affective] disorder: Secondary | ICD-10-CM | POA: Diagnosis not present

## 2022-05-20 DIAGNOSIS — F4312 Post-traumatic stress disorder, chronic: Secondary | ICD-10-CM | POA: Diagnosis not present

## 2022-05-21 ENCOUNTER — Ambulatory Visit: Payer: Medicaid Other | Admitting: Nurse Practitioner

## 2022-05-23 ENCOUNTER — Encounter: Payer: Self-pay | Admitting: Family Medicine

## 2022-05-23 ENCOUNTER — Ambulatory Visit: Payer: Medicaid Other | Admitting: Family Medicine

## 2022-05-23 VITALS — BP 116/73 | HR 63 | Temp 98.8°F | Ht 62.0 in | Wt 227.3 lb

## 2022-05-23 DIAGNOSIS — J301 Allergic rhinitis due to pollen: Secondary | ICD-10-CM | POA: Diagnosis not present

## 2022-05-23 DIAGNOSIS — J309 Allergic rhinitis, unspecified: Secondary | ICD-10-CM

## 2022-05-23 DIAGNOSIS — Z20828 Contact with and (suspected) exposure to other viral communicable diseases: Secondary | ICD-10-CM | POA: Diagnosis not present

## 2022-05-23 DIAGNOSIS — R0982 Postnasal drip: Secondary | ICD-10-CM | POA: Diagnosis not present

## 2022-05-23 DIAGNOSIS — J069 Acute upper respiratory infection, unspecified: Secondary | ICD-10-CM

## 2022-05-23 LAB — VERITOR FLU A/B WAIVED
Influenza A: NEGATIVE
Influenza B: NEGATIVE

## 2022-05-23 MED ORDER — PREDNISONE 10 MG PO TABS: ORAL_TABLET | ORAL | 0 refills | Status: DC

## 2022-05-23 NOTE — Assessment & Plan Note (Addendum)
Acute. Ongoing. Influenza swab done today, results: NEGATIVE

## 2022-05-23 NOTE — Progress Notes (Signed)
BP 116/73   Pulse 63   Temp 98.8 F (37.1 C) (Oral)   Ht '5\' 2"'$  (1.575 m)   Wt 227 lb 4.8 oz (103.1 kg)   SpO2 97%   BMI 41.57 kg/m    Subjective:    Patient ID: Sydney Valenzuela, female    DOB: 03/16/1981, 42 y.o.   MRN: [id number]  HPI: Sydney Valenzuela is a 42 y.o. female  Chief Complaint  Patient presents with   Cough    Patient says she has been symptomatic for the past 3 weeks. Patient says she became sick from her grand-kids. Patient says she has tried DayQuil and Mucinex. Patient says in the morning she is coughing up chunks of phlegm. Patient says she is very tired. Patient says she tested for COVID and it was negative.    Sore Throat   UPPER RESPIRATORY TRACT INFECTION Duration: about a month Worst symptom: cough Fever: no Cough: yes Shortness of breath: no Wheezing: no Chest pain: no Chest tightness: yes Chest congestion: yes Nasal congestion: yes Runny nose: no Post nasal drip: yes Sneezing: no Sore throat: yes Swollen glands: no Sinus pressure: no Headache: yes Face pain: no Toothache: no Ear pain: no  Ear pressure: no  Eyes red/itching:no Eye drainage/crusting: yes  Vomiting: no Rash: no Fatigue: yes Sick contacts: yes Strep contacts: no  Context: stable Recurrent sinusitis: no Relief with OTC cold/cough medications: no  Treatments attempted: dayquil, nyquil, ginger, elderberry   Relevant past medical, surgical, family and social history reviewed and updated as indicated. Interim medical history since our last visit reviewed. Allergies and medications reviewed and updated.  Review of Systems  Constitutional: Negative.   HENT:  Positive for congestion, ear discharge, postnasal drip and sore throat. Negative for dental problem, drooling, ear pain, facial swelling, hearing loss, mouth sores, nosebleeds, rhinorrhea, sinus pressure, sinus pain, sneezing, tinnitus, trouble swallowing and voice change.   Eyes: Negative.   Respiratory:  Positive for  cough. Negative for apnea, choking, chest tightness, shortness of breath, wheezing and stridor.   Cardiovascular: Negative.   Gastrointestinal: Negative.   Psychiatric/Behavioral: Negative.      Per HPI unless specifically indicated above     Objective:    BP 116/73   Pulse 63   Temp 98.8 F (37.1 C) (Oral)   Ht '5\' 2"'$  (1.575 m)   Wt 227 lb 4.8 oz (103.1 kg)   SpO2 97%   BMI 41.57 kg/m   Wt Readings from Last 3 Encounters:  05/23/22 227 lb 4.8 oz (103.1 kg)  05/09/22 223 lb 8 oz (101.4 kg)  12/18/21 236 lb 6.4 oz (107.2 kg)    Physical Exam Vitals and nursing note reviewed.  Constitutional:      General: She is not in acute distress.    Appearance: Normal appearance. She is well-developed. She is obese. She is not ill-appearing, toxic-appearing or diaphoretic.  HENT:     Head: Normocephalic and atraumatic.     Right Ear: External ear normal.     Left Ear: External ear normal.     Nose: Rhinorrhea present. No congestion.     Mouth/Throat:     Mouth: Mucous membranes are moist. No oral lesions.     Pharynx: Oropharynx is clear. Posterior oropharyngeal erythema present. No pharyngeal swelling, oropharyngeal exudate or uvula swelling.  Eyes:     General: No scleral icterus.       Right eye: No discharge.        Left eye:  No discharge.     Extraocular Movements: Extraocular movements intact.     Right eye: Normal extraocular motion.     Left eye: Normal extraocular motion.     Conjunctiva/sclera: Conjunctivae normal.     Pupils: Pupils are equal, round, and reactive to light.  Cardiovascular:     Rate and Rhythm: Normal rate and regular rhythm.     Pulses: Normal pulses.     Heart sounds: Normal heart sounds. No murmur heard.    No friction rub. No gallop.  Pulmonary:     Effort: Pulmonary effort is normal. No respiratory distress.     Breath sounds: Normal breath sounds. No stridor. No wheezing, rhonchi or rales.  Chest:     Chest wall: No tenderness.   Musculoskeletal:        General: Normal range of motion.     Cervical back: Normal range of motion and neck supple.  Skin:    General: Skin is warm and dry.     Capillary Refill: Capillary refill takes less than 2 seconds.     Coloration: Skin is not jaundiced or pale.     Findings: No bruising, erythema, lesion or rash.  Neurological:     General: No focal deficit present.     Mental Status: She is alert and oriented to person, place, and time. Mental status is at baseline.  Psychiatric:        Mood and Affect: Mood normal.        Behavior: Behavior normal.        Thought Content: Thought content normal.        Judgment: Judgment normal.     Results for orders placed or performed in visit on 05/23/22  Veritor Flu A/B Waived  Result Value Ref Range   Influenza A Negative Negative   Influenza B Negative Negative      Assessment & Plan:   Problem List Items Addressed This Visit       Respiratory   Allergic rhinitis with postnasal drip    Acute, unstable. Influenza done today, results came back negative. Start 6 day Prednisone '60mg'$  dose taper for symptom relief. Return as needed if symptoms fail to improve.         Other   Exposure to influenza    Acute. Ongoing. Influenza swab done today, results: NEGATIVE      Relevant Orders   Veritor Flu A/B Waived (Completed)   Other Visit Diagnoses     Upper respiratory tract infection, unspecified type    -  Primary   Flu negative. Will treat with prednisone taper. Call if not getting better or getting worse. Continue to monitor.        Follow up plan: Return if symptoms worsen or fail to improve.

## 2022-05-23 NOTE — Assessment & Plan Note (Addendum)
Acute, unstable. Influenza done today, results came back negative. Start 6 day Prednisone 97m dose taper for symptom relief. Return as needed if symptoms fail to improve.

## 2022-05-23 NOTE — Progress Notes (Signed)
BP 116/73   Pulse 63   Temp 98.8 F (37.1 C) (Oral)   Ht '5\' 2"'$  (1.575 m)   Wt 227 lb 4.8 oz (103.1 kg)   SpO2 97%   BMI 41.57 kg/m    Subjective:    Patient ID: Sydney Valenzuela, female    DOB: Aug 19, 1980, 42 y.o.   MRN: [id number]  HPI: Sydney Valenzuela is a 42 y.o. female  Chief Complaint  Patient presents with   Cough    Patient says she has been symptomatic for the past 3 weeks. Patient says she became sick from her grand-kids. Patient says she has tried DayQuil and Mucinex. Patient says in the morning she is coughing up chunks of phlegm. Patient says she is very tired. Patient says she tested for COVID and it was negative.    Sore Throat    Relevant past medical, surgical, family and social history reviewed and updated as indicated. Interim medical history since our last visit reviewed. Allergies and medications reviewed and updated.  UPPER RESPIRATORY TRACT INFECTION Negative COVID 3 weeks ago.  Duration: 4 weeks, yellow phlegm Worst symptom: Headache Fever: no Cough: yes, with yellow sputum Shortness of breath: no Wheezing: no Chest pain: No Chest tightness: yes Chest congestion: yes Nasal congestion: yes; Runny nose: yes Post nasal drip: yes Sneezing: yes Sore throat: Yes, but better Swollen glands: no Sinus pressure: yes Headache: yes Face pain: yes Toothache: no Ear pain: no but  drainage.  Ear pressure: no  Eyes red/itching:no Eye drainage/crusting: yes , crusting in the morning Vomiting: no Rash: no Fatigue: yes Sick contacts: yes; grandchildren; one has the flu; day before yesterday; going around the school.  Strep contacts: no  Context: fluctuating Recurrent sinusitis: no Relief with OTC cold/cough medications:  None    Treatments attempted: Dayquil, and nyquil did not provide much relief, instead caused a headache. Ginger and elderberry helped a little. Moist, warm, and cold compresses over sinuses did not provide much relief.   Review of  Systems  Constitutional:  Positive for fatigue. Negative for chills and fever.  HENT:  Positive for congestion, ear discharge, postnasal drip, rhinorrhea, sinus pressure, sinus pain, sneezing and sore throat. Negative for ear pain.   Respiratory:  Positive for cough and chest tightness. Negative for shortness of breath.   Cardiovascular:  Negative for chest pain.  Gastrointestinal:  Negative for nausea.    Per HPI unless specifically indicated above     Objective:    BP 116/73   Pulse 63   Temp 98.8 F (37.1 C) (Oral)   Ht '5\' 2"'$  (1.575 m)   Wt 227 lb 4.8 oz (103.1 kg)   SpO2 97%   BMI 41.57 kg/m   Wt Readings from Last 3 Encounters:  05/23/22 227 lb 4.8 oz (103.1 kg)  05/09/22 223 lb 8 oz (101.4 kg)  12/18/21 236 lb 6.4 oz (107.2 kg)    Physical Exam Vitals and nursing note reviewed.  Constitutional:      General: She is not in acute distress.    Appearance: Normal appearance. She is obese. She is not ill-appearing, toxic-appearing or diaphoretic.  HENT:     Head: Normocephalic and atraumatic.     Right Ear: Tympanic membrane, ear canal and external ear normal. There is no impacted cerumen.     Left Ear: Tympanic membrane, ear canal and external ear normal. There is no impacted cerumen.     Nose: Nasal tenderness, mucosal edema, congestion and rhinorrhea present.  Right Turbinates: Swollen and pale.     Left Turbinates: Swollen and pale.     Right Sinus: Maxillary sinus tenderness and frontal sinus tenderness present.     Left Sinus: Maxillary sinus tenderness and frontal sinus tenderness present.     Mouth/Throat:     Mouth: Mucous membranes are moist.     Pharynx: Posterior oropharyngeal erythema present. No oropharyngeal exudate.  Eyes:     General: No scleral icterus.       Right eye: No discharge.        Left eye: No discharge.     Extraocular Movements: Extraocular movements intact.     Conjunctiva/sclera: Conjunctivae normal.     Pupils: Pupils are equal,  round, and reactive to light.  Neck:     Vascular: No carotid bruit.  Cardiovascular:     Rate and Rhythm: Normal rate and regular rhythm.     Pulses: Normal pulses.     Heart sounds: Normal heart sounds, S1 normal and S2 normal. No murmur heard.    No friction rub. No gallop.  Pulmonary:     Effort: Pulmonary effort is normal. No respiratory distress.     Breath sounds: No stridor. Decreased breath sounds and rales present. No wheezing or rhonchi.  Chest:     Chest wall: No tenderness.  Musculoskeletal:        General: Normal range of motion.     Cervical back: Normal range of motion and neck supple. No rigidity. No muscular tenderness.  Lymphadenopathy:     Cervical: Cervical adenopathy present.  Skin:    General: Skin is warm and dry.     Capillary Refill: Capillary refill takes less than 2 seconds.     Coloration: Skin is not jaundiced or pale.     Findings: No bruising, erythema, lesion or rash.  Neurological:     General: No focal deficit present.     Mental Status: She is alert and oriented to person, place, and time. Mental status is at baseline.     Cranial Nerves: No cranial nerve deficit.     Sensory: No sensory deficit.     Motor: No weakness.     Coordination: Coordination normal.     Gait: Gait normal.     Deep Tendon Reflexes: Reflexes normal.  Psychiatric:        Mood and Affect: Mood normal.        Behavior: Behavior normal.        Thought Content: Thought content normal.        Judgment: Judgment normal.     Results for orders placed or performed in visit on 05/23/22  Veritor Flu A/B Waived  Result Value Ref Range   Influenza A Negative Negative   Influenza B Negative Negative      Assessment & Plan:   Problem List Items Addressed This Visit       Respiratory   Allergic rhinitis with postnasal drip - Primary    Acute, unstable. Influenza done today, results came back negative. Start 6 day Prednisone '60mg'$  dose taper for symptom relief. Return as  needed if symptoms fail to improve.         Other   Exposure to influenza    Acute. Ongoing. Influenza swab done today, results: NEGATIVE      Relevant Orders   Veritor Flu A/B Waived (Completed)     Follow up plan: Return if symptoms worsen or fail to improve.

## 2022-05-29 ENCOUNTER — Encounter: Payer: Self-pay | Admitting: Family Medicine

## 2022-05-30 MED ORDER — AMOXICILLIN-POT CLAVULANATE 875-125 MG PO TABS: 1.0000 | ORAL_TABLET | Freq: Two times a day (BID) | ORAL | 0 refills | Status: DC

## 2022-06-11 ENCOUNTER — Ambulatory Visit: Payer: Medicaid Other | Admitting: Physical Therapy

## 2022-06-12 ENCOUNTER — Encounter: Payer: Self-pay | Admitting: Family Medicine

## 2022-06-19 ENCOUNTER — Encounter: Payer: Medicaid Other | Admitting: Physical Therapy

## 2022-06-20 DIAGNOSIS — F39 Unspecified mood [affective] disorder: Secondary | ICD-10-CM | POA: Diagnosis not present

## 2022-06-20 DIAGNOSIS — F4312 Post-traumatic stress disorder, chronic: Secondary | ICD-10-CM | POA: Diagnosis not present

## 2022-06-20 DIAGNOSIS — F411 Generalized anxiety disorder: Secondary | ICD-10-CM | POA: Diagnosis not present

## 2022-06-26 ENCOUNTER — Encounter: Payer: Medicaid Other | Admitting: Physical Therapy

## 2022-07-03 ENCOUNTER — Encounter: Payer: Medicaid Other | Admitting: Physical Therapy

## 2022-07-10 ENCOUNTER — Encounter: Payer: Medicaid Other | Admitting: Physical Therapy

## 2022-07-17 ENCOUNTER — Encounter: Payer: Medicaid Other | Admitting: Physical Therapy

## 2022-07-17 DIAGNOSIS — F411 Generalized anxiety disorder: Secondary | ICD-10-CM | POA: Diagnosis not present

## 2022-07-17 DIAGNOSIS — F39 Unspecified mood [affective] disorder: Secondary | ICD-10-CM | POA: Diagnosis not present

## 2022-07-17 DIAGNOSIS — F4312 Post-traumatic stress disorder, chronic: Secondary | ICD-10-CM | POA: Diagnosis not present

## 2022-07-24 ENCOUNTER — Encounter: Payer: Medicaid Other | Admitting: Physical Therapy

## 2022-07-31 ENCOUNTER — Encounter: Payer: Medicaid Other | Admitting: Physical Therapy

## 2022-08-07 ENCOUNTER — Telehealth: Payer: Self-pay | Admitting: Family Medicine

## 2022-08-07 ENCOUNTER — Ambulatory Visit: Payer: Medicaid Other | Admitting: Family Medicine

## 2022-08-07 NOTE — Telephone Encounter (Signed)
Just an FYI

## 2022-08-07 NOTE — Telephone Encounter (Signed)
Copied from CRM [id number]. Topic: Appointment Scheduling - Scheduling Inquiry for Clinic >> Aug 07, 2022  8:50 AM Marlow Baars wrote: Reason for CRM: The patient called this morning needing to cancel her appt for later as she said her son was recently murdered and she is not in a good place especially with Mothers Day coming up. She was worried about a no show charge because she called in the same day as her appt. Please assist patient further

## 2022-08-16 ENCOUNTER — Ambulatory Visit (INDEPENDENT_AMBULATORY_CARE_PROVIDER_SITE_OTHER): Payer: Medicare Other | Admitting: Family Medicine

## 2022-08-16 ENCOUNTER — Encounter: Payer: Self-pay | Admitting: Family Medicine

## 2022-08-16 VITALS — BP 125/80 | HR 83 | Temp 98.6°F | Ht 62.0 in | Wt 235.8 lb

## 2022-08-16 DIAGNOSIS — F4321 Adjustment disorder with depressed mood: Secondary | ICD-10-CM

## 2022-08-16 DIAGNOSIS — Z634 Disappearance and death of family member: Secondary | ICD-10-CM

## 2022-08-16 MED ORDER — FAMOTIDINE 20 MG PO TABS: 20.0000 mg | ORAL_TABLET | Freq: Two times a day (BID) | ORAL | 1 refills | Status: DC

## 2022-08-16 MED ORDER — TIZANIDINE HCL 4 MG PO TABS: 4.0000 mg | ORAL_TABLET | Freq: Four times a day (QID) | ORAL | 3 refills | Status: DC | PRN

## 2022-08-16 NOTE — Progress Notes (Unsigned)
BP 125/80   Pulse 83   Temp 98.6 F (37 C) (Oral)   Ht 5\' 2"  (1.575 m)   Wt 235 lb 12.8 oz (107 kg)   SpO2 98%   BMI 43.13 kg/m    Subjective:    Patient ID: Sydney Valenzuela, female    DOB: [birth year], 42 y.o.   MRN: [id number]  HPI: Sydney Valenzuela is a 42 y.o. female  Chief Complaint  Patient presents with   Anxiety   Depression   ANXIETY/DEPRESSION- her son was murdered while driving. They have not caught the suspect. She is understandably distraught Duration: chronic, but significantly worse in the last 3 weeks since her son's death Status:exacerbated Anxious mood: yes  Excessive worrying: yes Irritability: no  Sweating: no Nausea: no Palpitations:yes Hyperventilation: yes Panic attacks: yes Agoraphobia: no  Obscessions/compulsions: yes Depressed mood: yes    08/16/2022   10:05 AM 05/23/2022    1:12 PM 05/09/2022   10:24 AM 12/18/2021    1:14 PM 09/10/2021   11:47 AM  Depression screen PHQ 2/9  Decreased Interest 3 2 3 3 3   Down, Depressed, Hopeless 3 1 3 3 3   PHQ - 2 Score 6 3 6 6 6   Altered sleeping 3 2 3 3 3   Tired, decreased energy 3 3 3 3 3   Change in appetite 3 3 3 3 2   Feeling bad or failure about yourself  3 3 3 3 3   Trouble concentrating 3 2 3 3 2   Moving slowly or fidgety/restless 3 2 3 3 2   Suicidal thoughts 3 1 2 2 1   PHQ-9 Score 27 19 26 26 22   Difficult doing work/chores Extremely dIfficult Extremely dIfficult Extremely dIfficult  Extremely dIfficult   Anhedonia: yes Weight changes: no Insomnia: yes hard to fall asleep  Hypersomnia: yes Fatigue/loss of energy: yes Feelings of worthlessness: yes Feelings of guilt: yes Impaired concentration/indecisiveness: yes Suicidal ideations: no  Crying spells: yes Recent Stressors/Life Changes: yes   Relationship problems: no   Family stress: yes     Financial stress: yes    Job stress: yes    Recent death/loss: yes  Relevant past medical, surgical, family and social history reviewed and  updated as indicated. Interim medical history since our last visit reviewed. Allergies and medications reviewed and updated.  Review of Systems  Constitutional: Negative.   Respiratory: Negative.    Cardiovascular: Negative.   Gastrointestinal: Negative.   Musculoskeletal: Negative.   Neurological: Negative.   Psychiatric/Behavioral:  Positive for dysphoric mood and sleep disturbance. Negative for agitation, behavioral problems, confusion, decreased concentration, hallucinations, self-injury and suicidal ideas. The patient is nervous/anxious. The patient is not hyperactive.     Per HPI unless specifically indicated above     Objective:    BP 125/80   Pulse 83   Temp 98.6 F (37 C) (Oral)   Ht 5\' 2"  (1.575 m)   Wt 235 lb 12.8 oz (107 kg)   SpO2 98%   BMI 43.13 kg/m   Wt Readings from Last 3 Encounters:  08/16/22 235 lb 12.8 oz (107 kg)  05/23/22 227 lb 4.8 oz (103.1 kg)  05/09/22 223 lb 8 oz (101.4 kg)    Physical Exam Vitals and nursing note reviewed.  Constitutional:      General: She is not in acute distress.    Appearance: Normal appearance. She is not ill-appearing, toxic-appearing or diaphoretic.  HENT:     Head: Normocephalic and atraumatic.     Right Ear:  External ear normal.     Left Ear: External ear normal.     Nose: Nose normal.     Mouth/Throat:     Mouth: Mucous membranes are moist.     Pharynx: Oropharynx is clear.  Eyes:     General: No scleral icterus.       Right eye: No discharge.        Left eye: No discharge.     Extraocular Movements: Extraocular movements intact.     Conjunctiva/sclera: Conjunctivae normal.     Pupils: Pupils are equal, round, and reactive to light.  Cardiovascular:     Rate and Rhythm: Normal rate and regular rhythm.     Pulses: Normal pulses.     Heart sounds: Normal heart sounds. No murmur heard.    No friction rub. No gallop.  Pulmonary:     Effort: Pulmonary effort is normal. No respiratory distress.     Breath  sounds: Normal breath sounds. No stridor. No wheezing, rhonchi or rales.  Chest:     Chest wall: No tenderness.  Musculoskeletal:        General: Normal range of motion.     Cervical back: Normal range of motion and neck supple.  Skin:    General: Skin is warm and dry.     Capillary Refill: Capillary refill takes less than 2 seconds.     Coloration: Skin is not jaundiced or pale.     Findings: No bruising, erythema, lesion or rash.  Neurological:     General: No focal deficit present.     Mental Status: She is alert and oriented to person, place, and time. Mental status is at baseline.  Psychiatric:        Mood and Affect: Mood normal. Affect is tearful.        Behavior: Behavior normal.        Thought Content: Thought content normal.        Judgment: Judgment normal.     Results for orders placed or performed in visit on 05/23/22  Veritor Flu A/B Waived  Result Value Ref Range   Influenza A Negative Negative   Influenza B Negative Negative      Assessment & Plan:   Problem List Items Addressed This Visit   None Visit Diagnoses     Grief at loss of child    -  Primary   In early grief. Will get counselor involved. Call with any concerns. Continue to monitor.   Relevant Orders   AMB Referral to Managed Medicaid Care Management        Follow up plan: Return in about 4 weeks (around 09/13/2022).

## 2022-08-18 ENCOUNTER — Encounter: Payer: Self-pay | Admitting: Family Medicine

## 2022-08-27 ENCOUNTER — Other Ambulatory Visit: Payer: Medicare Other | Admitting: Licensed Clinical Social Worker

## 2022-08-27 NOTE — Patient Instructions (Signed)
Visit Information  Sydney Valenzuela was given information about Medicaid Managed Care team care coordination services as a part of their Healthy [facility] Medicaid benefit. [name] verbally consented to engagement with the [facility] Managed Care team.   If you are experiencing a medical emergency, please call 911 or report to your local emergency department or urgent care.   If you have a non-emergency medical problem during routine business hours, please contact your provider's office and ask to speak with a nurse.   For questions related to your Healthy [facility] health plan, please call: [phone] or visit the homepage here: [email or web address]  If you would like to schedule transportation through your Healthy [facility] plan, please call the following number at least 2 days in advance of your appointment: [phone]  For information about your ride after you set it up, call Ride Assist at [phone]. Use this number to activate a Will Call pickup, or if your transportation is late for a scheduled pickup. Use this number, too, if you need to make a change or cancel a previously scheduled reservation.  If you need transportation services right away, call [phone]. The after-hours call center is staffed 24 hours to handle ride assistance and urgent reservation requests (including discharges) 365 days a year. Urgent trips include sick visits, hospital discharge requests and life-sustaining treatment.  Call the [facility] Line at [phone], at any time, 24 hours a day, 7 days a week. If you are in danger or need immediate medical attention call 911.  If you would like help to quit smoking, call 1-800-QUIT-NOW (807-783-3266) OR Espaol: 1-855-Djelo-Ya (4-742-595-6387) o para ms informacin haga clic aqu or Text READY to 564-332 to register via text   Following is a copy of your plan of care:  Care Plan : LCSW Plan of Care   Updates made by Gustavus Bryant, LCSW since 08/27/2022 12:00 AM     Problem: Depression Identification (Depression)      Goal: Depressive Symptoms Identified   Start Date: 08/27/2022  Priority: High  Note:   Priority: High  Timeframe:  Short Range Goal Priority:  High Start Date:   08/27/22             Expected End Date:  ongoing                     Follow Up Date--09/03/22 at 10 am  - check out bereavement counseling and long term counseling options resources sent to you by email  - keep 90 percent of all appointments  Why is this important?             Beating grief and depression may take some time.            If you don't feel better right away, don't give up on your treatment plan.    Current barriers:   Chronic Mental Health needs related to depression, anxiety and grief from losing her child recently. Patient requires Support, Education, Resources, Referrals, Advocacy, and Care Coordination, in order to meet Unmet Mental Health Needs. Patient will implement clinical interventions discussed today to decrease symptoms of grief and increase knowledge and/or ability of: coping skills. Mental Health Concerns and Social Isolation Patient lacks knowledge of available community counseling agencies and resources.  Clinical Goal(s): verbalize understanding of plan for management of Grief, Anxiety, Depression, and Stress and demonstrate a reduction in symptoms. Patient will connect with a provider for ongoing mental health treatment, increase coping skills,  healthy habits, self-management skills, and stress reduction. Patient already has a stable psychiatrist that she sees regularly.     Patient Goals/Self-Care Activities: Over the next 120 days Attend scheduled medical appointments Utilize healthy coping skills and supportive resources discussed Contact PCP with any questions or concerns Keep 90 percent of counseling appointments Call your insurance provider for more information  about your Enhanced Benefits  Check out counseling resources provided  Begin personal counseling with LCSW, to reduce and manage symptoms of Depression and Stress, until well-established with mental health provider Accept all calls from representative at [facility] as an effort to establish ongoing mental health counseling and supportive grief services.  Incorporate into daily practice - relaxation techniques, deep breathing exercises, and mindfulness meditation strategies. Talk about feelings with friends, family members, spiritual advisor, etc. Contact LCSW directly [phone]), if you have questions, need assistance, or if additional social work needs are identified between now and our next scheduled telephone outreach call. Call 988 for mental health hotline/crisis line if needed (24/7 available) Try techniques to reduce symptoms of anxiety/negative thinking (deep breathing, distraction, positive self talk, etc)  - develop a personal safety plan - develop a plan to deal with triggers like holidays, anniversaries - exercise at least 2 to 3 times per week - have a plan for how to handle bad days - journal feelings and what helps to feel better or worse - spend time or talk with others at least 2 to 3 times per week - watch for early signs of feeling worse - begin personal counseling - call and visit an old friend - check out volunteer opportunities - join a support group - laugh; watch a funny movie or comedian - learn and use visualization or guided imagery - perform a random act of kindness - practice relaxation or meditation daily - start or continue a personal journal - practice positive thinking and self-talk -continue with compliance of taking medication  -identify current effective and ineffective coping strategies.  -implement positive self-talk in care to increase self-esteem, confidence and feelings of control.  -consider alternative and complementary therapy approaches  such as meditation, mindfulness or yoga.  -journaling, prayer, worship services, meditation or pastoral counseling.  -increase participation in pleasurable group activities such as hobbies, singing, sports or volunteering).  -consider the use of meditative movement therapy such as tai chi, yoga or qigong.  -start a regular daily exercise program based on tolerance, ability and patient choice to support positive thinking and activity     Managing Loss, Adult People experience loss in many different ways throughout their lives. Events such as moving, changing jobs, and losing friends can create a sense of loss. The loss may be as serious as a major health change, divorce, death of a pet, or death of a loved one. All of these types of loss are likely to create a physical and emotional reaction known as grief. Grief is the result of a major change or an absence of something or someone that you count on. Grief is a normal reaction to loss. A variety of factors can affect your grieving experience, including: The nature of your loss. Your relationship to what or whom you lost. Your understanding of grief and how to manage it. Your support system. How to manage lifestyle changes Keep to your normal routine as much as possible. If you have trouble focusing or doing normal activities, it is acceptable to take some time away from your normal routine. Spend time with friends and  loved ones. Eat a healthy diet, get plenty of sleep, and rest when you feel tired. How to recognize changes  The way that you deal with your grief will affect your ability to function as you normally do. When grieving, you may experience these changes: Numbness, shock, sadness, anxiety, anger, denial, and guilt. Thoughts about death. Unexpected crying. A physical sensation of emptiness in your stomach. Problems sleeping and eating. Tiredness (fatigue). Loss of interest in normal activities. Dreaming about or imagining seeing  the person who died. A need to remember what or whom you lost. Difficulty thinking about anything other than your loss for a period of time. Relief. If you have been expecting the loss for a while, you may feel a sense of relief when it happens. Follow these instructions at home:    Activity Express your feelings in healthy ways, such as: Talking with others about your loss. It may be helpful to find others who have had a similar loss, such as a support group. Writing down your feelings in a journal. Doing physical activities to release stress and emotional energy. Doing creative activities like painting, sculpting, or playing or listening to music. Practicing resilience. This is the ability to recover and adjust after facing challenges. Reading some resources that encourage resilience may help you to learn ways to practice those behaviors. General instructions Be patient with yourself and others. Allow the grieving process to happen, and remember that grieving takes time. It is likely that you may never feel completely done with some grief. You may find a way to move on while still cherishing memories and feelings about your loss. Accepting your loss is a process. It can take months or longer to adjust. Keep all follow-up visits as told by your health care provider. This is important. Where to find support To get support for managing loss: Ask your health care provider for help and recommendations, such as grief counseling or therapy. Think about joining a support group for people who are managing a loss. Where to find more information You can find more information about managing loss from: American Society of Clinical Oncology: www.cancer.net American Psychological Association: DiceTournament.ca Contact a health care provider if: Your grief is extreme and keeps getting worse. You have ongoing grief that does not improve. Your body shows symptoms of grief, such as illness. You feel  depressed, anxious, or lonely. Get help right away if: You have thoughts about hurting yourself or others. If you ever feel like you may hurt yourself or others, or have thoughts about taking your own life, get help right away. You can go to your nearest emergency department or call: Your local emergency services (911 in the [place] A suicide crisis helpline, such as the National Suicide Prevention Lifeline at (539)044-8061. This is open 24 hours a day. Summary Grief is the result of a major change or an absence of someone or something that you count on. Grief is a normal reaction to loss. The depth of grief and the period of recovery depend on the type of loss and your ability to adjust to the change and process your feelings. Processing grief requires patience and a willingness to accept your feelings and talk about your loss with people who are supportive. It is important to find resources that work for you and to realize that people experience grief differently. There is not one grieving process that works for everyone in the same way. Be aware that when grief becomes extreme, it can lead to  more severe issues like isolation, depression, anxiety, or suicidal thoughts. Talk with your health care provider if you have any of these issues. This information is not intended to replace advice given to you by your health care provider. Make sure you discuss any questions you have with your health care provider. Document Revised: 05/22/2018 Document Reviewed: 08/01/2016 Elsevier Patient Education  2020 [employer].  Help for Managing Grief   When you are experiencing grief, many resources and support are available to help you. You are not alone.    Grief Share (https://www.SunglassSpecialist.gl):    [employer] of [address] [place], [place]      [employer]  [phone] [address] [place], [place]     [facility] [facility] [address] [place], [place]       [facility] [address] [place], [place]  [phone]   [facility] [address] [place], [place] [phone]   [facility] [address] [place], [place]     [phone]   [facility] [address] [place], [place]     [phone]   If a Hospice or Palliative Care team has been involved in the care of your loved one, you may reach out to your contact there or locally, you may reach out to [facility]:    [facility] [place] [facility]   [address] [place], [place] [address] [phone]   [facility]  [facility] in [place], [place]  [address], [place], [place] [address] Phone: [phone]  Crisis Support Resources     24- Hour Availability:    [facility]  [address] [place], [place] [phone] Crisis [phone]   [facility] [phone]  [facility]  [phone]    [facility]  [phone] (after hours)   Therapeutic Alternative/Mobile Crisis   [phone]   [place] National Suicide Hotline  504-538-9161 Len Child[place]) [place] 854   Call 53 for mental health emergencies   [facility]  [phone]);  [place] and [employer]  [phone]); [place], [place], [place], [place], [place], [place], [place]    [facility] for [facility] Residents For 24/7 walk-up access to mental health services for [facility] children (4+), adolescents and adults, please visit the [facility] located at [address] in [place], [place].  *[facility] also provides comprehensive outpatient behavioral health services in a variety of locations around the [place].  Connect With [place] [address] [place], [place]  [address] HelpLine: (612)850-3012 or 1-608-731-0733  Get Directions  Find Help 24/7 By Phone Call our 24-hour HelpLine at (772)129-3626 or 873-169-2985 for immediate assistance for mental health and substance abuse issues.  Walk-In Help [place]: [facility] (Ages 4 and Up) [place]: Emergency Dept., [facility] Additional Resources National Hopeline Network: 1-800-SUICIDE The National Suicide Prevention Lifeline: 4-008-676-PPJK    Initial Goal

## 2022-08-27 NOTE — Patient Outreach (Signed)
Medicaid Managed Care Social Work Note  08/27/2022 Name:  KETSIA LEATON MRN:  [id number] DOB:  17-Jan-1981  [name] is an 42 y.o. year old female who is a primary patient of Dorcas Carrow, DO.  The [facility] Managed Care Coordination team was consulted for assistance with:  Grief Counseling  Ms. Jolivet was given information about Medicaid Managed Care Coordination team services today. [name] Patient agreed to services and verbal consent obtained.  Engaged with patient  for by telephone forinitial visit in response to referral for case management and/or care coordination services.   Assessments/Interventions:  Review of past medical history, allergies, medications, health status, including review of consultants reports, laboratory and other test data, was performed as part of comprehensive evaluation and provision of chronic care management services.  SDOH: (Social Determinant of Health) assessments and interventions performed: SDOH Interventions    Flowsheet Row Patient Outreach Telephone from 08/27/2022 in [place] [facility]PART[facility] Office Visit from 12/18/2021 in [facility] Office Visit from 06/01/2021 in [facility] Office Visit from 06/28/2019 in [facility]  SDOH Interventions      Housing Interventions Intervention Not Indicated  [Patient has already moved into housing but had unstable housing for the past year] -- -- --  Depression Interventions/Treatment  Currently on Treatment Referral to Psychiatry Medication, Currently on Treatment Referral to Psychiatry  Stress Interventions Offered [employer], Provide Counseling -- -- --       Advanced Directives Status:  See Care Plan for related entries.  Care Plan                 Allergies  Allergen Reactions   Codeine Itching    Initial dose is the worst, but becomes more tolerable with more doses, severe itching  and tongue swelling    Medications Reviewed Today     Reviewed by Dorcas Carrow, DO (Physician) on 08/18/22 at 2201  Med List Status: <None>   Medication Order Taking? Sig Documenting Provider Last Dose Status Informant  acetaminophen (TYLENOL) 500 MG tablet [id number] Yes Take 500-1,000 mg by mouth daily as needed for moderate pain or headache. [provider] Taking Active Self  albuterol (PROVENTIL) (2.5 MG/3ML) 0.083% nebulizer solution [id number] Yes Take 3 mLs (2.5 mg total) by nebulization every 6 (six) hours as needed for wheezing or shortness of breath. Johnson, Megan P, DO Taking Active   albuterol (VENTOLIN HFA) 108 (90 Base) MCG/ACT inhaler [id number] Yes Inhale 2 puffs into the lungs every 6 (six) hours as needed for wheezing or shortness of breath. Johnson, Megan P, DO Taking Active   ALPRAZolam Prudy Feeler) 0.5 MG tablet [id number] Yes Take 0.5 mg by mouth 2 (two) times daily as needed. [provider] Taking Active   aspirin-acetaminophen-caffeine (EXCEDRIN MIGRAINE) [id number] MG tablet [id number] Yes Take by mouth every 6 (six) hours as needed for headache. [provider] Taking Active   Black Cohosh 40 MG CAPS [id number] Yes Take by mouth. [provider] Taking Active   cetirizine (ZYRTEC) 10 MG tablet [id number] Yes Take 1 tablet (10 mg total) by mouth at bedtime. Johnson, Megan P, DO Taking Active   ciclopirox (PENLAC) 8 % solution [id number] Yes Apply topically at bedtime. Apply over nail and surrounding skin. Apply daily over previous coat. After seven (7) days, may remove with alcohol and continue cycle. Johnson, Megan P, DO Taking Active   desvenlafaxine (PRISTIQ) 100 MG 24 hr tablet  [id number] Yes Take 100 mg by mouth daily. [provider] Taking Active   diphenhydrAMINE HCl (BENADRYL ALLERGY PO) [id number] Yes Take by mouth 2 (two) times daily. [provider] Taking Active            Med Note Greggory Stallion Jun 28, 2019   3:01 PM) As needed  famotidine (PEPCID) 20 MG tablet [id number]  Take 1 tablet (20 mg total) by mouth 2 (two) times daily. Johnson, Megan P, DO  Active   Ginger, Zingiber officinalis, (GINGER ROOT) 550 MG CAPS [id number] Yes Take 550 mg by mouth 2 (two) times daily. TID in the A.M. TID in the P.M. [provider] Taking Active   glucosamine-chondroitin 500-400 MG tablet [id number] Yes Take 1 tablet by mouth 3 (three) times daily. [provider] Taking Active   lamoTRIgine (LAMICTAL) 200 MG tablet [id number] Yes Take 200 mg by mouth daily. [provider] Taking Active   lidocaine (XYLOCAINE) 2 % solution [id number] Yes Use as directed 15 mLs in the mouth or throat every 4 (four) hours as needed for mouth pain. Johnson, Megan P, DO Taking Active   methocarbamol (ROBAXIN) 500 MG tablet [id number] Yes Take 1 tablet (500 mg total) by mouth every 8 (eight) hours as needed for muscle spasms. Olevia Perches P, DO Taking Active   Multiple Vitamins-Minerals (MULTIVITAMIN WITH MINERALS) tablet [id number] Yes Take 1 tablet by mouth daily. [provider] Taking Active   nicotine (NICODERM CQ) 14 mg/24hr patch [id number] Yes Place 1 patch (14 mg total) onto the skin daily. Johnson, Megan P, DO Taking Active   nicotine (NICODERM CQ) 21 mg/24hr patch [id number] Yes Place 1 patch (21 mg total) onto the skin daily. Johnson, Megan P, DO Taking Active   nicotine (NICODERM CQ) 7 mg/24hr patch [id number] Yes Place 1 patch (7 mg total) onto the skin daily. Olevia Perches P, DO Taking Active   nicotine polacrilex (NICOTINE MINI) 2 MG lozenge [id number] Yes Take 1 lozenge (2 mg total) by mouth as needed for smoking cessation. No more than 10 a day Johnson, Megan P, DO Taking Active   Omega-3 Fatty Acids (FISH OIL) 1200 MG CPDR [id number] Yes Take 1,200 mg by mouth at bedtime. [provider] Taking Active   pantoprazole (PROTONIX) 40 MG tablet [id number] Yes Take 1 tablet (40 mg total) by  mouth 2 (two) times daily. Johnson, Megan P, DO Taking Active   prazosin (MINIPRESS) 1 MG capsule [id number] Yes Take 1 mg by mouth at bedtime. [provider] Taking Active   prazosin (MINIPRESS) 5 MG capsule [id number] Yes Take by mouth. [provider] Taking Active   pregabalin (LYRICA) 100 MG capsule [id number] Yes Take 150mg  in the AM, 150mg  in the PM and 2 tabs at bedtime to be taken with her 50mg  tabs in the AM and PM for 150mg  total Johnson, Megan P, DO Taking Active   pregabalin (LYRICA) 50 MG capsule [id number] Yes Take 1 capsule (50 mg total) by mouth 2 (two) times daily. With her 100mg  for 150mg  total Laural Benes, Megan P, DO Taking Active   tiZANidine (ZANAFLEX) 4 MG tablet [id number]  Take 1 tablet (4 mg total) by mouth every 6 (six) hours as needed for muscle spasms. OK to take 2 at bedtime Laural Benes, Megan P, DO  Active   Turmeric (QC TUMERIC COMPLEX PO) [id number] Yes Take 1,500 mg by mouth at bedtime. [provider] Taking Active  Patient Active Problem List   Diagnosis Date Noted   Exposure to influenza 05/23/2022   Migraine 12/22/2017   Plantar fasciitis 08/04/2017   Atypical nevi 02/26/2017   Knee pain, left 01/28/2017   Allergic rhinitis with postnasal drip 01/28/2017   Depression, major, single episode, severe (HCC) 01/28/2017   PTSD (post-traumatic stress disorder) 01/28/2017   Shoulder pain 01/28/2017   Anxiety 01/28/2017   Fibromyalgia 01/28/2017   Primary insomnia 01/28/2017   Smoker 08/04/2014   Abnormal uterine bleeding (AUB) 06/02/2014   Obesity, Class III, BMI 40-49.9 (morbid obesity) (HCC) 02/01/2014   Hiatal hernia 02/01/2014   Esophageal reflux 02/01/2014    Conditions to be addressed/monitored per PCP order:   Grief  Care Plan : LCSW Plan of Care  Updates made by Gustavus Bryant, LCSW since 08/27/2022 12:00 AM     Problem: Depression Identification (Depression)      Goal: Depressive Symptoms Identified   Start  Date: 08/27/2022  Priority: High  Note:   Priority: High  Timeframe:  Short Range Goal Priority:  High Start Date:   08/27/22             Expected End Date:  ongoing                     Follow Up Date--09/03/22 at 10 am  - check out bereavement counseling and long term counseling options resources sent to you by email  - keep 90 percent of all appointments  Why is this important?             Beating grief and depression may take some time.            If you don't feel better right away, don't give up on your treatment plan.    Current barriers:   Chronic Mental Health needs related to depression, anxiety and grief from losing her child recently. Patient requires Support, Education, Resources, Referrals, Advocacy, and Care Coordination, in order to meet Unmet Mental Health Needs. Patient will implement clinical interventions discussed today to decrease symptoms of grief and increase knowledge and/or ability of: coping skills. Mental Health Concerns and Social Isolation Patient lacks knowledge of available community counseling agencies and resources.  Clinical Goal(s): verbalize understanding of plan for management of Grief, Anxiety, Depression, and Stress and demonstrate a reduction in symptoms. Patient will connect with a provider for ongoing mental health treatment, increase coping skills, healthy habits, self-management skills, and stress reduction. Patient already has a stable psychiatrist that she sees regularly.     Clinical Interventions:  Assessed patient's previous and current treatment, coping skills, support system and barriers to care. Patient recently lost her son Chrissie Noa by murder last month. Patient has a daughter who is battling substance abuse.  Patient actively sees her psychiatrist at [facility]. However, we are unsure if there are therapist there which is the service she is seeking right now.  Patient and her spouse recently transitioned from  living with their church friend to a new apartment on 08/02/22.  Patient reported having issues with stable housing for the last several years.  Patient is very involved in the church. Verbalization of feelings encouraged, motivational interviewing employed Emotional support provided, positive coping strategies explored Self care emphasized Patient is agreeable to referral to bereavement counseling with [facility]. [facility] LCSW left a message with the referral on their voice mail. [facility] LCSW sent patient and email with grief support resources on 08/27/22.  Patient  reports significant worsening grief impacting her ability to function appropriately and carry out daily task. LCSW provided education on relaxation techniques such as meditation, deep breathing, massage, grounding exercises or yoga that can activate the body's relaxation response and ease symptoms of extreme grief, anxiety and stress. LCSW ask that when pt is struggling with difficult emotions and racing thoughts that they start this relaxation response process. LCSW provided extensive education on healthy coping skills for anxiety. SW used active and reflective listening, validated patient's feelings/concerns, and provided emotional support. Patient will work on implementing appropriate self-care habits into their daily routine such as: staying positive, writing a gratitude list, drinking water, staying active around the house, taking their medications as prescribed, combating negative thoughts or emotions and staying connected with their family and friends. Positive reinforcement provided for this decision to work on this. [facility] LCSW advised patient to start putting breathing/grounding/meditation/self-care exercises into her nightly routine to combat racing thoughts at night. LCSW encouraged patient to wake up at the same time each day, make their sleeping environment comfortable, exercise when able, to limit naps and to not eat or drink anything right  before bed.  Motivational Interviewing employed Depression screen reviewed  PHQ2/ PHQ9 completed Mindfulness or Relaxation training provided Active listening / Reflection utilized  Advance Care and HCPOA education provided Emotional Support Provided Problem Solving /Task Center strategies reviewed Provided psychoeducation for mental health needs  Provided brief CBT , patient is familiar with CBT and has benefited from this intervention in therapy before Reviewed mental health medications and discussed importance of compliance:  Quality of sleep assessed & Sleep Hygiene techniques promoted  Participation in counseling encouraged  Verbalization of feelings encouraged  Suicidal Ideation/Homicidal Ideation assessed: Patient denies SI/HI  Review resources, discussed options and provided patient information about  Mental Health Resources Inter-disciplinary care team collaboration (see longitudinal plan of care) Patient Goals/Self-Care Activities: Over the next 120 days Attend scheduled medical appointments Utilize healthy coping skills and supportive resources discussed Contact PCP with any questions or concerns Keep 90 percent of counseling appointments Call your insurance provider for more information about your Enhanced Benefits  Check out counseling resources provided  Begin personal counseling with LCSW, to reduce and manage symptoms of Depression and Stress, until well-established with mental health provider Accept all calls from representative at [facility] as an effort to establish ongoing mental health counseling and supportive grief services.  Incorporate into daily practice - relaxation techniques, deep breathing exercises, and mindfulness meditation strategies. Talk about feelings with friends, family members, spiritual advisor, etc. Contact LCSW directly [phone]), if you have questions, need assistance, or if additional social work needs are identified between now and  our next scheduled telephone outreach call. Call [phone] for mental health hotline/crisis line if needed (24/7 available) Try techniques to reduce symptoms of anxiety/negative thinking (deep breathing, distraction, positive self talk, etc)  - develop a personal safety plan - develop a plan to deal with triggers like holidays, anniversaries - exercise at least 2 to 3 times per week - have a plan for how to handle bad days - journal feelings and what helps to feel better or worse - spend time or talk with others at least 2 to 3 times per week - watch for early signs of feeling worse - begin personal counseling - call and visit an old friend - check out volunteer opportunities - join a support group - laugh; watch a funny movie or comedian - learn and use visualization or guided imagery -  perform a random act of kindness - practice relaxation or meditation daily - start or continue a personal journal - practice positive thinking and self-talk -continue with compliance of taking medication  -identify current effective and ineffective coping strategies.  -implement positive self-talk in care to increase self-esteem, confidence and feelings of control.  -consider alternative and complementary therapy approaches such as meditation, mindfulness or yoga.  -journaling, prayer, worship services, meditation or pastoral counseling.  -increase participation in pleasurable group activities such as hobbies, singing, sports or volunteering).  -consider the use of meditative movement therapy such as tai chi, yoga or qigong.  -start a regular daily exercise program based on tolerance, ability and patient choice to support positive thinking and activity     Managing Loss, Adult People experience loss in many different ways throughout their lives. Events such as moving, changing jobs, and losing friends can create a sense of loss. The loss may be as serious as a major health change, divorce, death of a pet, or  death of a loved one. All of these types of loss are likely to create a physical and emotional reaction known as grief. Grief is the result of a major change or an absence of something or someone that you count on. Grief is a normal reaction to loss. A variety of factors can affect your grieving experience, including: The nature of your loss. Your relationship to what or whom you lost. Your understanding of grief and how to manage it. Your support system. How to manage lifestyle changes Keep to your normal routine as much as possible. If you have trouble focusing or doing normal activities, it is acceptable to take some time away from your normal routine. Spend time with friends and loved ones. Eat a healthy diet, get plenty of sleep, and rest when you feel tired. How to recognize changes  The way that you deal with your grief will affect your ability to function as you normally do. When grieving, you may experience these changes: Numbness, shock, sadness, anxiety, anger, denial, and guilt. Thoughts about death. Unexpected crying. A physical sensation of emptiness in your stomach. Problems sleeping and eating. Tiredness (fatigue). Loss of interest in normal activities. Dreaming about or imagining seeing the person who died. A need to remember what or whom you lost. Difficulty thinking about anything other than your loss for a period of time. Relief. If you have been expecting the loss for a while, you may feel a sense of relief when it happens. Follow these instructions at home:    Activity Express your feelings in healthy ways, such as: Talking with others about your loss. It may be helpful to find others who have had a similar loss, such as a support group. Writing down your feelings in a journal. Doing physical activities to release stress and emotional energy. Doing creative activities like painting, sculpting, or playing or listening to music. Practicing resilience. This is the  ability to recover and adjust after facing challenges. Reading some resources that encourage resilience may help you to learn ways to practice those behaviors. General instructions Be patient with yourself and others. Allow the grieving process to happen, and remember that grieving takes time. It is likely that you may never feel completely done with some grief. You may find a way to move on while still cherishing memories and feelings about your loss. Accepting your loss is a process. It can take months or longer to adjust. Keep all follow-up visits as told by your  health care provider. This is important. Where to find support To get support for managing loss: Ask your health care provider for help and recommendations, such as grief counseling or therapy. Think about joining a support group for people who are managing a loss. Where to find more information You can find more information about managing loss from: American Society of Clinical Oncology: www.cancer.net American Psychological Association: D[employer].ca Contact a health care provider if: Your grief is extreme and keeps getting worse. You have ongoing grief that does not improve. Your body shows symptoms of grief, such as illness. You feel depressed, anxious, or lonely. Get help right away if: You have thoughts about hurting yourself or others. If you ever feel like you may hurt yourself or others, or have thoughts about taking your own life, get help right away. You can go to your nearest emergency department or call: Your local emergency services (911 in the [place] A suicide crisis helpline, such as the National Suicide Prevention Lifeline at 639-790-5487. This is open 24 hours a day. Summary Grief is the result of a major change or an absence of someone or something that you count on. Grief is a normal reaction to loss. The depth of grief and the period of recovery depend on the type of loss and your ability to adjust to the  change and process your feelings. Processing grief requires patience and a willingness to accept your feelings and talk about your loss with people who are supportive. It is important to find resources that work for you and to realize that people experience grief differently. There is not one grieving process that works for everyone in the same way. Be aware that when grief becomes extreme, it can lead to more severe issues like isolation, depression, anxiety, or suicidal thoughts. Talk with your health care provider if you have any of these issues. This information is not intended to replace advice given to you by your health care provider. Make sure you discuss any questions you have with your health care provider. Document Revised: 05/22/2018 Document Reviewed: 08/01/2016 Elsevier Patient Education  2020 [employer].  Help for Managing Grief   When you are experiencing grief, many resources and support are available to help you. You are not alone.    Grief Share (https://www.SunglassSpecialist.gl):    [employer] of [address] [place], [place]      [employer]  [phone] [address] [place], [place]     [employer] [address] [place], [place]      [facility] [address] [place], [place]  [phone]   [facility] [address] [place], [place] [phone]   [facility] [address] [place], [place]     [phone]   [facility] [address] [place], [place]     [phone]   If a Hospice or Palliative Care team has been involved in the care of your loved one, you may reach out to your contact there or locally, you may reach out to [facility]:    [facility]osp[facility] and Palliative Care of [facility] [facility]   [address] [place], [place] [address] [phone]   [facility]  [facility] in [place],  [place]  [address], [place], [place] [address] Phone: [phone]  Crisis Support Resources     24- Hour Availability:    [place]  [facility]  [address] [place], [place] [phone] Crisis [phone]   [facility] [phone]  [place] [facility]  [phone]    [facility]  [phone] (after hours)   Therapeutic Alternative/Mobile Crisis   [phone]   [place] National Suicide Hotline  334-189-9580 Len Child[place]) [place] [address]84   Call [phone] for mental health emergencies   [facility]  [phone]);  [place] and [employer]  [phone]); [place], [place], [place], [place], [place], [place], [place]    [facility] for [facility] Residents For 24/7 walk-up access to mental health services for [facility] children (4+), adolescents and adults, please visit the [facility] located at [address] in [place], [place].  *[facility] also provides comprehensive outpatient behavioral health services in a variety of locations around the [place].  Connect With [place] [address] [place], [place] [address] HelpLine: (628) 285-3354 or 1-5204996916  Get Directions  Find Help 24/7 By Phone Call our 24-hour HelpLine at (407)170-7581 or 214 056 6431 for immediate assistance for mental health and substance abuse issues.  Walk-In Help [place]: [facility] (Ages 4 and Up) [place]: Emergency Dept., [facility] Additional Resources National Hopeline Network: 1-800-SUICIDE The National Suicide Prevention Lifeline: 6-270-350-KXFG    Initial Goal     Follow up:  Patient agrees to Care Plan and Follow-up.  Plan: The Managed Medicaid care management team will reach out to the patient again over the next 30 days.  Dickie La, BSW, MSW,  [employer] Managed Medicaid LCSW [facility]  [facility] [email or web address] Phone: [phone]

## 2022-08-28 ENCOUNTER — Ambulatory Visit
Admission: RE | Admit: 2022-08-28 | Discharge: 2022-08-28 | Disposition: A | Discharge status: Home / Self Care | Payer: Medicare Other | Source: Ambulatory Visit | Attending: Family Medicine | Admitting: Family Medicine

## 2022-08-28 DIAGNOSIS — Z1231 Encounter for screening mammogram for malignant neoplasm of breast: Secondary | ICD-10-CM | POA: Insufficient documentation

## 2022-08-29 ENCOUNTER — Inpatient Hospital Stay
Admission: RE | Admit: 2022-08-29 | Discharge: 2022-08-29 | Disposition: A | Discharge status: Home / Self Care | Payer: Self-pay | Source: Ambulatory Visit | Attending: Family Medicine | Admitting: Family Medicine

## 2022-08-29 ENCOUNTER — Other Ambulatory Visit: Payer: Self-pay | Admitting: *Deleted

## 2022-08-29 DIAGNOSIS — Z1231 Encounter for screening mammogram for malignant neoplasm of breast: Secondary | ICD-10-CM

## 2022-09-03 ENCOUNTER — Other Ambulatory Visit: Payer: Medicare Other | Admitting: Licensed Clinical Social Worker

## 2022-09-03 NOTE — Patient Instructions (Signed)
[name] ,   The [facility] Managed Care Team is available to provide assistance to you with your healthcare needs at no cost and as a benefit of your [facility] Health plan. Here are so resources to keep in mind until you are established with a long term therapist.     70- Hour Availability:    Medical [facility]  [address] [place], [place] [phone] Crisis [phone]   [facility] [phone]  [facility]  [phone]    [facility]  [phone] (after hours)   Therapeutic Alternative/Mobile Crisis   [phone]   [place] National Suicide Hotline  (628)673-9605 Len Childs) [place] [address]   Call (514)696-7170 for mental health emergencies   [facility]  [phone]);  [place] and [employer]  ([phone]); [place], [place], [place], [place], [place], [place], [place]    [facility] for [facility] Residents For 24/7 walk-up access to mental health services for [facility] children (4+), adolescents and adults, please visit the [facility] located at [address] in [place], [place].  *[place] also provides comprehensive outpatient behavioral health services in a variety of locations around the [place].  Connect With [place] [address] [place], [place] [address] HelpLine: (606)559-4423 or [phone]-(310) [phone]  Get Directions  Find Help 24/7 By Phone Call our 24-hour HelpLine at 815-756-8531 or [phone] for immediate assistance for mental health and substance abuse issues.  Walk-In Help [place]: [facility] (Ages 4 and Up) [place]: Emergency Dept., [facility] Additional Resources National Hopeline Network: 1-800-SUICIDE The National Suicide Prevention Lifeline: 1-800-273-TALK      Dickie La, BSW, MSW, LCSW Managed  Medicaid LCSW [facility]  [facility] [email or web address] Phone: [phone]

## 2022-09-03 NOTE — Patient Outreach (Signed)
Medicaid Managed Care Social Work Note  09/03/2022 Name:  Sydney Valenzuela MRN:  [id number] DOB:  [birth year]  Sydney Valenzuela is an 42 y.o. year old female who is a primary patient of Dorcas Carrow, DO.  The Medicaid Managed Care Coordination team was consulted for assistance with:  Mental Health Counseling and Resources  Ms. Cantor was given information about Medicaid Managed Care Coordination team services today. Sydney Valenzuela Patient agreed to services and verbal consent obtained.  Engaged with patient  for by telephone forfollow up visit in response to referral for case management and/or care coordination services.   Assessments/Interventions:  Review of past medical history, allergies, medications, health status, including review of consultants reports, laboratory and other test data, was performed as part of comprehensive evaluation and provision of chronic care management services.  SDOH: (Social Determinant of Health) assessments and interventions performed: SDOH Interventions    Flowsheet Row Patient Outreach Telephone from 09/03/2022 in [place] [facility] Patient Outreach Telephone from 08/27/2022 in [place] [facility] Office Visit from 12/18/2021 in [facility] Office Visit from 06/01/2021 in [facility] Office Visit from 06/28/2019 in [facility]  SDOH Interventions       Housing Interventions -- Intervention Not Indicated  [Patient has already moved into housing but had unstable housing for the past year] -- -- --  Depression Interventions/Treatment  -- Currently on Treatment Referral to Psychiatry Medication, Currently on Treatment Referral to Psychiatry  Stress Interventions Offered [employer], Provide Counseling Offered [employer] Resources, Provide Counseling -- -- --       Advanced Directives Status:  See Care Plan for related  entries.  Care Plan                 Allergies  Allergen Reactions   Codeine Itching    Initial dose is the worst, but becomes more tolerable with more doses, severe itching and tongue swelling    Medications Reviewed Today     Reviewed by Dorcas Carrow, DO (Physician) on 08/18/22 at 2201  Med List Status: <None>   Medication Order Taking? Sig Documenting Provider Last Dose Status Informant  acetaminophen (TYLENOL) 500 MG tablet [id number] Yes Take 500-1,000 mg by mouth daily as needed for moderate pain or headache. [provider] Taking Active Self  albuterol (PROVENTIL) (2.5 MG/3ML) 0.083% nebulizer solution [id number] Yes Take 3 mLs (2.5 mg total) by nebulization every 6 (six) hours as needed for wheezing or shortness of breath. Johnson, Megan P, DO Taking Active   albuterol (VENTOLIN HFA) 108 (90 Base) MCG/ACT inhaler [id number] Yes Inhale 2 puffs into the lungs every 6 (six) hours as needed for wheezing or shortness of breath. Johnson, Megan P, DO Taking Active   ALPRAZolam Prudy Feeler) 0.5 MG tablet [id number] Yes Take 0.5 mg by mouth 2 (two) times daily as needed. [provider] Taking Active   aspirin-acetaminophen-caffeine (EXCEDRIN MIGRAINE) [phone] MG tablet [id number] Yes Take by mouth every 6 (six) hours as needed for headache. [provider] Taking Active   Black Cohosh 40 MG CAPS [id number] Yes Take by mouth. [provider] Taking Active   cetirizine (ZYRTEC) 10 MG tablet [id number] Yes Take 1 tablet (10 mg total) by mouth at bedtime. Johnson, Megan P, DO Taking Active   ciclopirox (PENLAC) 8 % solution [id number] Yes Apply topically at bedtime. Apply over nail and surrounding skin. Apply daily over previous coat. After seven (  7) days, may remove with alcohol and continue cycle. Johnson, Megan P, DO Taking Active   desvenlafaxine (PRISTIQ) 100 MG 24 hr tablet [id number] Yes Take 100 mg by mouth daily. [provider] Taking Active    diphenhydrAMINE HCl (BENADRYL ALLERGY PO) [id number] Yes Take by mouth 2 (two) times daily. [provider] Taking Active            Med Note Greggory Stallion Jun 28, 2019  3:01 PM) As needed  famotidine (PEPCID) 20 MG tablet [id number]  Take 1 tablet (20 mg total) by mouth 2 (two) times daily. Johnson, Megan P, DO  Active   Ginger, Zingiber officinalis, (GINGER ROOT) 550 MG CAPS [id number] Yes Take 550 mg by mouth 2 (two) times daily. TID in the A.M. TID in the P.M. [provider] Taking Active   glucosamine-chondroitin 500-400 MG tablet [id number] Yes Take 1 tablet by mouth 3 (three) times daily. [provider] Taking Active   lamoTRIgine (LAMICTAL) 200 MG tablet [id number] Yes Take 200 mg by mouth daily. [provider] Taking Active   lidocaine (XYLOCAINE) 2 % solution [id number] Yes Use as directed 15 mLs in the mouth or throat every 4 (four) hours as needed for mouth pain. Johnson, Megan P, DO Taking Active   methocarbamol (ROBAXIN) 500 MG tablet [id number] Yes Take 1 tablet (500 mg total) by mouth every 8 (eight) hours as needed for muscle spasms. Olevia Perches P, DO Taking Active   Multiple Vitamins-Minerals (MULTIVITAMIN WITH MINERALS) tablet [id number] Yes Take 1 tablet by mouth daily. [provider] Taking Active   nicotine (NICODERM CQ) 14 mg/24hr patch [id number] Yes Place 1 patch (14 mg total) onto the skin daily. Johnson, Megan P, DO Taking Active   nicotine (NICODERM CQ) 21 mg/24hr patch [id number] Yes Place 1 patch (21 mg total) onto the skin daily. Johnson, Megan P, DO Taking Active   nicotine (NICODERM CQ) 7 mg/24hr patch [id number] Yes Place 1 patch (7 mg total) onto the skin daily. Olevia Perches P, DO Taking Active   nicotine polacrilex (NICOTINE MINI) 2 MG lozenge [id number] Yes Take 1 lozenge (2 mg total) by mouth as needed for smoking cessation. No more than 10 a day Johnson, Megan P, DO Taking Active   Omega-3 Fatty Acids (FISH OIL)  1200 MG CPDR [id number] Yes Take 1,200 mg by mouth at bedtime. [provider] Taking Active   pantoprazole (PROTONIX) 40 MG tablet [id number] Yes Take 1 tablet (40 mg total) by mouth 2 (two) times daily. Johnson, Megan P, DO Taking Active   prazosin (MINIPRESS) 1 MG capsule [id number] Yes Take 1 mg by mouth at bedtime. [provider] Taking Active   prazosin (MINIPRESS) 5 MG capsule [id number] Yes Take by mouth. [provider] Taking Active   pregabalin (LYRICA) 100 MG capsule [id number] Yes Take 150mg  in the AM, 150mg  in the PM and 2 tabs at bedtime to be taken with her 50mg  tabs in the AM and PM for 150mg  total Johnson, Megan P, DO Taking Active   pregabalin (LYRICA) 50 MG capsule [id number] Yes Take 1 capsule (50 mg total) by mouth 2 (two) times daily. With her 100mg  for 150mg  total Johnson, Megan P, DO Taking Active   tiZANidine (ZANAFLEX) 4 MG tablet [id number]  Take 1 tablet (4 mg total) by mouth every 6 (six) hours as needed for muscle spasms. OK to take 2 at bedtime Johnson, Megan P, DO  Active   Turmeric (QC TUMERIC COMPLEX PO)  [id number] Yes Take 1,500 mg by mouth at bedtime. [provider] Taking Active             Patient Active Problem List   Diagnosis Date Noted   Exposure to influenza 05/23/2022   Migraine 12/22/2017   Plantar fasciitis 08/04/2017   Atypical nevi 02/26/2017   Knee pain, left 01/28/2017   Allergic rhinitis with postnasal drip 01/28/2017   Depression, major, single episode, severe (HCC) 01/28/2017   PTSD (post-traumatic stress disorder) 01/28/2017   Shoulder pain 01/28/2017   Anxiety 01/28/2017   Fibromyalgia 01/28/2017   Primary insomnia 01/28/2017   Smoker 08/04/2014   Abnormal uterine bleeding (AUB) 06/02/2014   Obesity, Class III, BMI 40-49.9 (morbid obesity) (HCC) 02/01/2014   Hiatal hernia 02/01/2014   Esophageal reflux 02/01/2014    Conditions to be addressed/monitored per PCP order:   grief/depression  Care  Plan : LCSW Plan of Care  Updates made by Gustavus Bryant, LCSW since 09/03/2022 12:00 AM     Problem: Depression Identification (Depression)      Goal: Depressive Symptoms Identified   Start Date: 08/27/2022  Priority: High  Note:   Priority: High  Timeframe:  Short Range Goal Priority:  High Start Date:   08/27/22             Expected End Date:  ongoing                     Follow Up Date--09/16/22 at 945 am  - check out bereavement counseling and long term counseling options resources sent to you by email  - keep 90 percent of all appointments  Why is this important?             Beating grief and depression may take some time.            If you don't feel better right away, don't give up on your treatment plan.    Current barriers:   Chronic Mental Health needs related to depression, anxiety and grief from losing her child recently. Patient requires Support, Education, Resources, Referrals, Advocacy, and Care Coordination, in order to meet Unmet Mental Health Needs. Patient will implement clinical interventions discussed today to decrease symptoms of grief and increase knowledge and/or ability of: coping skills. Mental Health Concerns and Social Isolation Patient lacks knowledge of available community counseling agencies and resources.  Clinical Goal(s): verbalize understanding of plan for management of Grief, Anxiety, Depression, and Stress and demonstrate a reduction in symptoms. Patient will connect with a provider for ongoing mental health treatment, increase coping skills, healthy habits, self-management skills, and stress reduction. Patient already has a stable psychiatrist that she sees regularly.     Clinical Interventions:  Assessed patient's previous and current treatment, coping skills, support system and barriers to care. Patient recently lost her son Chrissie Noa by murder last month. Patient has a daughter who is battling substance abuse.  Patient actively sees her  psychiatrist at [facility]. However, we are unsure if there are therapist there which is the service she is seeking right now.  Patient and her spouse recently transitioned from living with their church friend to a new apartment on 08/02/22.  Patient reported having issues with stable housing for the last several years.  Patient is very involved in the church. Verbalization of feelings encouraged, motivational interviewing employed Emotional support provided, positive coping strategies explored Self care emphasized Patient is agreeable to referral to bereavement counseling with [facility].  [facility] LCSW left a message with the referral on their voice mail. [facility] LCSW sent patient and email with grief support resources on 08/27/22.  Patient reports significant worsening grief impacting her ability to function appropriately and carry out daily task. LCSW provided education on relaxation techniques such as meditation, deep breathing, massage, grounding exercises or yoga that can activate the body's relaxation response and ease symptoms of extreme grief, anxiety and stress. LCSW ask that when pt is struggling with difficult emotions and racing thoughts that they start this relaxation response process. LCSW provided extensive education on healthy coping skills for anxiety. SW used active and reflective listening, validated patient's feelings/concerns, and provided emotional support. Patient will work on implementing appropriate self-care habits into their daily routine such as: staying positive, writing a gratitude list, drinking water, staying active around the house, taking their medications as prescribed, combating negative thoughts or emotions and staying connected with their family and friends. Positive reinforcement provided for this decision to work on this. [facility] LCSW advised patient to start putting breathing/grounding/meditation/self-care exercises into her nightly routine to combat  racing thoughts at night. LCSW encouraged patient to wake up at the same time each day, make their sleeping environment comfortable, exercise when able, to limit naps and to not eat or drink anything right before bed.  Motivational Interviewing employed Depression screen reviewed  PHQ2/ PHQ9 completed Mindfulness or Relaxation training provided Active listening / Reflection utilized  Advance Care and HCPOA education provided Emotional Support Provided Problem Solving /Task Center strategies reviewed Provided psychoeducation for mental health needs  Provided brief CBT , patient is familiar with CBT and has benefited from this intervention in therapy before Reviewed mental health medications and discussed importance of compliance:  Quality of sleep assessed & Sleep Hygiene techniques promoted  Participation in counseling encouraged  Verbalization of feelings encouraged  Suicidal Ideation/Homicidal Ideation assessed: Patient denies SI/HI  Review resources, discussed options and provided patient information about  Mental Health Resources Inter-disciplinary care team collaboration (see longitudinal plan of care) 09/03/22- Patient reports that she is feeling more stable but admits to ongoing frequent mood swifts when reminders of her son come up. She reports that she has really not done much over the last week since talking with [facility] LCSW due to getting physically sick. She reports that she will check her email to look at resources that [facility] LCSW sent last week and to check and see if [facility] had emailed/called her yet for grief counseling. Patient is agreeable to referral to [facility]. Referral made today by [facility] LCSW on 09/03/22.  Patient Goals/Self-Care Activities: Over the next 120 days Attend scheduled medical appointments Utilize healthy coping skills and supportive resources discussed Contact PCP with any questions or concerns Keep 90 percent of counseling appointments Call your  insurance provider for more information about your Enhanced Benefits  Check out counseling resources provided  Begin personal counseling with LCSW, to reduce and manage symptoms of Depression and Stress, until well-established with mental health provider Accept all calls from representative at [facility] as an effort to establish ongoing mental health counseling and supportive grief services.  Incorporate into daily practice - relaxation techniques, deep breathing exercises, and mindfulness meditation strategies. Talk about feelings with friends, family members, spiritual advisor, etc. Contact LCSW directly [phone]), if you have questions, need assistance, or if additional social work needs are identified between now and our next scheduled telephone outreach call. Call [phone] for mental health hotline/crisis line if needed (24/7 available) Try techniques  to reduce symptoms of anxiety/negative thinking (deep breathing, distraction, positive self talk, etc)  - develop a personal safety plan - develop a plan to deal with triggers like holidays, anniversaries - exercise at least 2 to 3 times per week - have a plan for how to handle bad days - journal feelings and what helps to feel better or worse - spend time or talk with others at least 2 to 3 times per week - watch for early signs of feeling worse - begin personal counseling - call and visit an old friend - check out volunteer opportunities - join a support group - laugh; watch a funny movie or comedian - learn and use visualization or guided imagery - perform a random act of kindness - practice relaxation or meditation daily - start or continue a personal journal - practice positive thinking and self-talk -continue with compliance of taking medication  -identify current effective and ineffective coping strategies.  -implement positive self-talk in care to increase self-esteem, confidence and feelings of control.  -consider  alternative and complementary therapy approaches such as meditation, mindfulness or yoga.  -journaling, prayer, worship services, meditation or pastoral counseling.  -increase participation in pleasurable group activities such as hobbies, singing, sports or volunteering).  -consider the use of meditative movement therapy such as tai chi, yoga or qigong.  -start a regular daily exercise program based on tolerance, ability and patient choice to support positive thinking and activity     Managing Loss, Adult People experience loss in many different ways throughout their lives. Events such as moving, changing jobs, and losing friends can create a sense of loss. The loss may be as serious as a major health change, divorce, death of a pet, or death of a loved one. All of these types of loss are likely to create a physical and emotional reaction known as grief. Grief is the result of a major change or an absence of something or someone that you count on. Grief is a normal reaction to loss. A variety of factors can affect your grieving experience, including: The nature of your loss. Your relationship to what or whom you lost. Your understanding of grief and how to manage it. Your support system. How to manage lifestyle changes Keep to your normal routine as much as possible. If you have trouble focusing or doing normal activities, it is acceptable to take some time away from your normal routine. Spend time with friends and loved ones. Eat a healthy diet, get plenty of sleep, and rest when you feel tired. How to recognize changes  The way that you deal with your grief will affect your ability to function as you normally do. When grieving, you may experience these changes: Numbness, shock, sadness, anxiety, anger, denial, and guilt. Thoughts about death. Unexpected crying. A physical sensation of emptiness in your stomach. Problems sleeping and eating. Tiredness (fatigue). Loss of interest in normal  activities. Dreaming about or imagining seeing the person who died. A need to remember what or whom you lost. Difficulty thinking about anything other than your loss for a period of time. Relief. If you have been expecting the loss for a while, you may feel a sense of relief when it happens. Follow these instructions at home:    Activity Express your feelings in healthy ways, such as: Talking with others about your loss. It may be helpful to find others who have had a similar loss, such as a support group. Writing down your feelings in a  journal. Doing physical activities to release stress and emotional energy. Doing creative activities like painting, sculpting, or playing or listening to music. Practicing resilience. This is the ability to recover and adjust after facing challenges. Reading some resources that encourage resilience may help you to learn ways to practice those behaviors. General instructions Be patient with yourself and others. Allow the grieving process to happen, and remember that grieving takes time. It is likely that you may never feel completely done with some grief. You may find a way to move on while still cherishing memories and feelings about your loss. Accepting your loss is a process. It can take months or longer to adjust. Keep all follow-up visits as told by your health care provider. This is important. Where to find support To get support for managing loss: Ask your health care provider for help and recommendations, such as grief counseling or therapy. Think about joining a support group for people who are managing a loss. Where to find more information You can find more information about managing loss from: American Society of Clinical Oncology: www.cancer.net American Psychological Association: [employer].ca Contact a health care provider if: Your grief is extreme and keeps getting worse. You have ongoing grief that does not improve. Your body shows  symptoms of grief, such as illness. You feel depressed, anxious, or lonely. Get help right away if: You have thoughts about hurting yourself or others. If you ever feel like you may hurt yourself or others, or have thoughts about taking your own life, get help right away. You can go to your nearest emergency department or call: Your local emergency services (911 in the [place] A suicide crisis helpline, such as the National Suicide Prevention Lifeline at (430) 569-5099. This is open 24 hours a day. Summary Grief is the result of a major change or an absence of someone or something that you count on. Grief is a normal reaction to loss. The depth of grief and the period of recovery depend on the type of loss and your ability to adjust to the change and process your feelings. Processing grief requires patience and a willingness to accept your feelings and talk about your loss with people who are supportive. It is important to find resources that work for you and to realize that people experience grief differently. There is not one grieving process that works for everyone in the same way. Be aware that when grief becomes extreme, it can lead to more severe issues like isolation, depression, anxiety, or suicidal thoughts. Talk with your health care provider if you have any of these issues. This information is not intended to replace advice given to you by your health care provider. Make sure you discuss any questions you have with your health care provider. Document Revised: 05/22/2018 Document Reviewed: 08/01/2016 Elsevier Patient Education  2020 [employer].  Help for Managing Grief   When you are experiencing grief, many resources and support are available to help you. You are not alone.    Grief Share (https://www.SunglassSpecialist.gl):    [employer] of [address] [place], [place]      [employer]  [phone] [address] [place], [place]     [employer] [address] [place], [place]      [employer] [address] [place], [place]  [place]- [phone]   [facility] [address] [place], [place] [phone]   [employer] [address] [place], [place]  [phone]   [facility] [address] [place], [place]     [phone]   If a Hospice or Palliative Care team has been involved in the care of your loved one, you may reach out to your contact there or locally, you may reach out to [facility]:    [facility]   [address] [place], [place] [address] [phone]   [facility]  [facility] in [place], [place]  [address], [place], [place] [address] Phone: [phone]  Crisis Support Resources     24- Hour Availability:    [facility]  [address] [place], [place] [phone] Crisis [phone]   [facility] [phone]  [place] [facility]  [phone]    [facility]  [phone] (after hours)   Therapeutic Alternative/Mobile Crisis   [phone]   [place] National Suicide Hotline  214-870-0016 Len [place]) [place] [address]   Call 29 for mental health emergencies   [facility]  ([phone]);  [place] and [employer]  [phone]); [place], [place], [place], [place], [place], [place], [place]    [facility] Residents For 24/7 walk-up access to mental health services for [facility] children (4+), adolescents and adults, please visit the [facility] located at [address] in [place], [place].  *[place] also provides comprehensive outpatient behavioral health services in a variety of locations around the [place].  Connect With [place] [address] [place], [place] [address] HelpLine: (269) 791-4077 or 1-413-195-7385  Get Directions  Find Help 24/7 By Phone Call our 24-hour HelpLine at 707-057-0529 or (507)458-6379 for immediate assistance for mental health and substance abuse issues.  Walk-In Help [place]: [facility] (Ages 4 and Up) [place]: Emergency Dept., [facility] Additional Resources National Hopeline Network: 1-800-SUICIDE The National Suicide Prevention Lifeline: 5-102-585-IDPO    Initial Goal     Follow up:  Patient agrees to Care Plan and Follow-up.  Plan: The Managed Medicaid care management team will reach out to the patient again over the next 30 days.  Dickie La, BSW, MSW, [employer] LCSW [facility]  [facility] [email or web address] Phone: [phone]

## 2022-09-09 ENCOUNTER — Other Ambulatory Visit: Payer: Medicare Other | Admitting: Licensed Clinical Social Worker

## 2022-09-09 NOTE — Patient Instructions (Signed)
Visit Information  Sydney Valenzuela was given information about Medicaid Managed Care team care coordination services and verbally consented to engagement with the [facility] Managed Care team.   Following is a copy of your plan of care:  Care Plan : LCSW Plan of Care  Updates made by Gustavus Bryant, LCSW since 09/09/2022 12:00 AM     Problem: Depression Identification (Depression)      Goal: Depressive Symptoms Identified   Start Date: 08/27/2022  Priority: High  Note:   Priority: High  Timeframe:  Short Range Goal Priority:  High Start Date:   08/27/22             Expected End Date:  ongoing                     Follow Up Date- 09/27/22 at 10:15 am  - check out bereavement counseling and long term counseling options resources sent to you by email  - keep 90 percent of all appointments  Why is this important?             Beating grief and depression may take some time.            If you don't feel better right away, don't give up on your treatment plan.    Current barriers:   Chronic Mental Health needs related to depression, anxiety and grief from losing her child recently. Patient requires Support, Education, Resources, Referrals, Advocacy, and Care Coordination, in order to meet Unmet Mental Health Needs. Patient will implement clinical interventions discussed today to decrease symptoms of grief and increase knowledge and/or ability of: coping skills. Mental Health Concerns and Social Isolation Patient lacks knowledge of available community counseling agencies and resources.  Clinical Goal(s): verbalize understanding of plan for management of Grief, Anxiety, Depression, and Stress and demonstrate a reduction in symptoms. Patient will connect with a provider for ongoing mental health treatment, increase coping skills, healthy habits, self-management skills, and stress reduction. Patient already has a stable psychiatrist that she sees regularly.     Patient Goals/Self-Care Activities:  Over the next 120 days Attend scheduled medical appointments Utilize healthy coping skills and supportive resources discussed Contact PCP with any questions or concerns Keep 90 percent of counseling appointments Call your insurance provider for more information about your Enhanced Benefits  Check out counseling resources provided  Begin personal counseling with LCSW, to reduce and manage symptoms of Depression and Stress, until well-established with mental health provider Accept all calls from representative at [facility] as an effort to establish ongoing mental health counseling and supportive grief services.  Incorporate into daily practice - relaxation techniques, deep breathing exercises, and mindfulness meditation strategies. Talk about feelings with friends, family members, spiritual advisor, etc. Contact LCSW directly [phone]), if you have questions, need assistance, or if additional social work needs are identified between now and our next scheduled telephone outreach call. Call 988 for mental health hotline/crisis line if needed (24/7 available) Try techniques to reduce symptoms of anxiety/negative thinking (deep breathing, distraction, positive self talk, etc)  - develop a personal safety plan - develop a plan to deal with triggers like holidays, anniversaries - exercise at least 2 to 3 times per week - have a plan for how to handle bad days - journal feelings and what helps to feel better or worse - spend time or talk with others at least 2 to 3 times per week - watch for early signs of feeling worse - begin personal  counseling - call and visit an old friend - check out volunteer opportunities - join a support group - laugh; watch a funny movie or comedian - learn and use visualization or guided imagery - perform a random act of kindness - practice relaxation or meditation daily - start or continue a personal journal - practice positive thinking and  self-talk -continue with compliance of taking medication  -identify current effective and ineffective coping strategies.  -implement positive self-talk in care to increase self-esteem, confidence and feelings of control.  -consider alternative and complementary therapy approaches such as meditation, mindfulness or yoga.  -journaling, prayer, worship services, meditation or pastoral counseling.  -increase participation in pleasurable group activities such as hobbies, singing, sports or volunteering).  -consider the use of meditative movement therapy such as tai chi, yoga or qigong.  -start a regular daily exercise program based on tolerance, ability and patient choice to support positive thinking and activity     Managing Loss, Adult People experience loss in many different ways throughout their lives. Events such as moving, changing jobs, and losing friends can create a sense of loss. The loss may be as serious as a major health change, divorce, death of a pet, or death of a loved one. All of these types of loss are likely to create a physical and emotional reaction known as grief. Grief is the result of a major change or an absence of something or someone that you count on. Grief is a normal reaction to loss. A variety of factors can affect your grieving experience, including: The nature of your loss. Your relationship to what or whom you lost. Your understanding of grief and how to manage it. Your support system. How to manage lifestyle changes Keep to your normal routine as much as possible. If you have trouble focusing or doing normal activities, it is acceptable to take some time away from your normal routine. Spend time with friends and loved ones. Eat a healthy diet, get plenty of sleep, and rest when you feel tired. How to recognize changes  The way that you deal with your grief will affect your ability to function as you normally do. When grieving, you may experience these  changes: Numbness, shock, sadness, anxiety, anger, denial, and guilt. Thoughts about death. Unexpected crying. A physical sensation of emptiness in your stomach. Problems sleeping and eating. Tiredness (fatigue). Loss of interest in normal activities. Dreaming about or imagining seeing the person who died. A need to remember what or whom you lost. Difficulty thinking about anything other than your loss for a period of time. Relief. If you have been expecting the loss for a while, you may feel a sense of relief when it happens. Follow these instructions at home:    Activity Express your feelings in healthy ways, such as: Talking with others about your loss. It may be helpful to find others who have had a similar loss, such as a support group. Writing down your feelings in a journal. Doing physical activities to release stress and emotional energy. Doing creative activities like painting, sculpting, or playing or listening to music. Practicing resilience. This is the ability to recover and adjust after facing challenges. Reading some resources that encourage resilience may help you to learn ways to practice those behaviors. General instructions Be patient with yourself and others. Allow the grieving process to happen, and remember that grieving takes time. It is likely that you may never feel completely done with some grief. You may find a  way to move on while still cherishing memories and feelings about your loss. Accepting your loss is a process. It can take months or longer to adjust. Keep all follow-up visits as told by your health care provider. This is important. Where to find support To get support for managing loss: Ask your health care provider for help and recommendations, such as grief counseling or therapy. Think about joining a support group for people who are managing a loss. Where to find more information You can find more information about managing loss from: American  Society of Clinical Oncology: www.cancer.net American Psychological Association: [employer].ca Contact a health care provider if: Your grief is extreme and keeps getting worse. You have ongoing grief that does not improve. Your body shows symptoms of grief, such as illness. You feel depressed, anxious, or lonely. Get help right away if: You have thoughts about hurting yourself or others. If you ever feel like you may hurt yourself or others, or have thoughts about taking your own life, get help right away. You can go to your nearest emergency department or call: Your local emergency services (911 in the [place] A suicide crisis helpline, such as the National Suicide Prevention Lifeline at 250-840-5776. This is open 24 hours a day. Summary Grief is the result of a major change or an absence of someone or something that you count on. Grief is a normal reaction to loss. The depth of grief and the period of recovery depend on the type of loss and your ability to adjust to the change and process your feelings. Processing grief requires patience and a willingness to accept your feelings and talk about your loss with people who are supportive. It is important to find resources that work for you and to realize that people experience grief differently. There is not one grieving process that works for everyone in the same way. Be aware that when grief becomes extreme, it can lead to more severe issues like isolation, depression, anxiety, or suicidal thoughts. Talk with your health care provider if you have any of these issues. This information is not intended to replace advice given to you by your health care provider. Make sure you discuss any questions you have with your health care provider. Document Revised: 05/22/2018 Document Reviewed: 08/01/2016 Elsevier Patient Education  2020 [employer].  Help for Managing Grief   When you are experiencing grief, many resources and support are available to  help you. You are not alone.    Grief Share (https://www.SunglassSpecialist.gl):    [employer] of [address] [place], [place]      [facility]  [phone] [address] [place], [place]     [facility] [address] [place], [place]      [facility] [address] [place], [place]  [phone]   [facility] [address] [place], [place] [phone]   [facility] [address] [place], [place]     [phone]   [facility] [address] [place], [place]     [phone]   If a Hospice or Palliative Care team has been involved in the care of your loved one, you may reach out to your contact there or locally, you may reach out to [facility]:    [facility] of [place] and [facility]   [address] [place], [place] [address] [phone]   [facility]  [facility] in [place], [place]  [address], [place], [place] [address] Phone: [phone]  Crisis Support Resources     24- Hour Availability:    [facility]  [address] [place], [place] [phone] Crisis [phone]   [facility] [phone]  [facility]  [phone]    [facility]  [phone] (after hours)   Therapeutic Alternative/Mobile Crisis   [phone]   [place] National Suicide Hotline  (206)075-0596 Len Childs) [place] [address]   Call [phone] for mental health emergencies   [facility]  [phone]);  [place] and [employer]  [phone]); [place], [place], [place], [place], [place], [place], [place]    [facility] for [facility] Residents For 24/7 walk-up access to mental health services for [facility] children (4+), adolescents and adults, please visit  the [facility] located at [address] in [place], [place].  *[facility] also provides comprehensive outpatient behavioral health services in a variety of locations around the [place].  Connect With [place] [address] [place], [place] [address] HelpLine: [phone] or [phone]  Get Directions  Find Help 24/7 By Phone Call our 24-hour HelpLine at 7132409929 or [phone] for immediate assistance for mental health and substance abuse issues.  Walk-In Help [place]: [facility] (Ages 4 and Up) [place]: Emergency Dept., [facility] Additional Resources National Hopeline Network: 1-800-SUICIDE The National Suicide Prevention Lifeline: 9-371-696-VELF    Follow up goal    Dickie La, BSW, MSW, [employer] Man[employer] LCSW [facility]  [facility] [email or web address] Phone: [phone]

## 2022-09-09 NOTE — Patient Outreach (Signed)
Medicaid Managed Care Social Work Note  09/09/2022 Name:  Sydney Valenzuela MRN:  [id number] DOB:  07/14/1980  Sydney Valenzuela is an 42 y.o. year old female who is a primary patient of Dorcas Carrow, DO.  The [facility] Managed Care Coordination team was consulted for assistance with:  Grief Counseling  Sydney Valenzuela was given information about Medicaid Managed Care Coordination team services today. Sydney Valenzuela Patient agreed to services and verbal consent obtained.  Engaged with patient  for by telephone forfollow up visit in response to referral for case management and/or care coordination services.   Assessments/Interventions:  Review of past medical history, allergies, medications, health status, including review of consultants reports, laboratory and other test data, was performed as part of comprehensive evaluation and provision of chronic care management services.  SDOH: (Social Determinant of Health) assessments and interventions performed: SDOH Interventions    Flowsheet Row Patient Outreach Telephone from 09/09/2022 in [place] POPULATION HEALTH DEPARTMENT Patient Outreach Telephone from 09/03/2022 in [place] POPULATION HEALTH DEPARTMENT Patient Outreach Telephone from 08/27/2022 in [place] POPULATION HEALTH DEPARTMENT Office Visit from 12/18/2021 in [facility] Office Visit from 06/01/2021 in [facility] Office Visit from 06/28/2019 in [facility]  SDOH Interventions        Housing Interventions -- -- Intervention Not Indicated  [Patient has already moved into housing but had unstable housing for the past year] -- -- --  Depression Interventions/Treatment  -- -- Currently on Treatment Referral to Psychiatry Medication, Currently on Treatment Referral to Psychiatry  Stress Interventions Offered [employer], Provide Counseling Offered [employer] Resources, Provide Counseling Offered  [employer] Resources, Provide Counseling -- -- --       Advanced Directives Status:  See Care Plan for related entries.  Care Plan                 Allergies  Allergen Reactions   Codeine Itching    Initial dose is the worst, but becomes more tolerable with more doses, severe itching and tongue swelling    Medications Reviewed Today     Reviewed by Dorcas Carrow, DO (Physician) on 08/18/22 at 2201  Med List Status: <None>   Medication Order Taking? Sig Documenting Provider Last Dose Status Informant  acetaminophen (TYLENOL) 500 MG tablet [id number] Yes Take 500-1,000 mg by mouth daily as needed for moderate pain or headache. [provider] Taking Active Self  albuterol (PROVENTIL) (2.5 MG/3ML) 0.083% nebulizer solution [id number] Yes Take 3 mLs (2.5 mg total) by nebulization every 6 (six) hours as needed for wheezing or shortness of breath. Johnson, Megan P, DO Taking Active   albuterol (VENTOLIN HFA) 108 (90 Base) MCG/ACT inhaler [id number] Yes Inhale 2 puffs into the lungs every 6 (six) hours as needed for wheezing or shortness of breath. Johnson, Megan P, DO Taking Active   ALPRAZolam Prudy Feeler) 0.5 MG tablet [id number] Yes Take 0.5 mg by mouth 2 (two) times daily as needed. [provider] Taking Active   aspirin-acetaminophen-caffeine (EXCEDRIN MIGRAINE) [phone] MG tablet [id number] Yes Take by mouth every 6 (six) hours as needed for headache. [provider] Taking Active   Black Cohosh 40 MG CAPS [id number] Yes Take by mouth. [provider] Taking Active   cetirizine (ZYRTEC) 10 MG tablet [id number] Yes Take 1 tablet (10 mg total) by mouth at bedtime. Olevia Perches P, DO Taking Active   ciclopirox (PENLAC) 8 % solution [id number] Yes  Apply topically at bedtime. Apply over nail and surrounding skin. Apply daily over previous coat. After seven (7) days, may remove with alcohol and continue cycle. Johnson, Megan P, DO Taking Active    desvenlafaxine (PRISTIQ) 100 MG 24 hr tablet [id number] Yes Take 100 mg by mouth daily. [provider] Taking Active   diphenhydrAMINE HCl (BENADRYL ALLERGY PO) [id number] Yes Take by mouth 2 (two) times daily. [provider] Taking Active            Med Note Greggory Stallion Jun 28, 2019  3:01 PM) As needed  famotidine (PEPCID) 20 MG tablet [id number]  Take 1 tablet (20 mg total) by mouth 2 (two) times daily. Johnson, Megan P, DO  Active   Ginger, Zingiber officinalis, (GINGER ROOT) 550 MG CAPS [id number] Yes Take 550 mg by mouth 2 (two) times daily. TID in the A.M. TID in the P.M. [provider] Taking Active   glucosamine-chondroitin 500-400 MG tablet [id number] Yes Take 1 tablet by mouth 3 (three) times daily. [provider] Taking Active   lamoTRIgine (LAMICTAL) 200 MG tablet [id number] Yes Take 200 mg by mouth daily. [provider] Taking Active   lidocaine (XYLOCAINE) 2 % solution [id number] Yes Use as directed 15 mLs in the mouth or throat every 4 (four) hours as needed for mouth pain. Johnson, Megan P, DO Taking Active   methocarbamol (ROBAXIN) 500 MG tablet [id number] Yes Take 1 tablet (500 mg total) by mouth every 8 (eight) hours as needed for muscle spasms. Olevia Perches P, DO Taking Active   Multiple Vitamins-Minerals (MULTIVITAMIN WITH MINERALS) tablet [id number] Yes Take 1 tablet by mouth daily. [provider] Taking Active   nicotine (NICODERM CQ) 14 mg/24hr patch [id number] Yes Place 1 patch (14 mg total) onto the skin daily. Johnson, Megan P, DO Taking Active   nicotine (NICODERM CQ) 21 mg/24hr patch [id number] Yes Place 1 patch (21 mg total) onto the skin daily. Johnson, Megan P, DO Taking Active   nicotine (NICODERM CQ) 7 mg/24hr patch [id number] Yes Place 1 patch (7 mg total) onto the skin daily. Olevia Perches P, DO Taking Active   nicotine polacrilex (NICOTINE MINI) 2 MG lozenge [id number] Yes Take 1 lozenge (2 mg total) by  mouth as needed for smoking cessation. No more than 10 a day Johnson, Megan P, DO Taking Active   Omega-3 Fatty Acids (FISH OIL) 1200 MG CPDR [id number] Yes Take 1,200 mg by mouth at bedtime. [provider] Taking Active   pantoprazole (PROTONIX) 40 MG tablet [id number] Yes Take 1 tablet (40 mg total) by mouth 2 (two) times daily. Johnson, Megan P, DO Taking Active   prazosin (MINIPRESS) 1 MG capsule [id number] Yes Take 1 mg by mouth at bedtime. [provider] Taking Active   prazosin (MINIPRESS) 5 MG capsule [id number] Yes Take by mouth. [provider] Taking Active   pregabalin (LYRICA) 100 MG capsule [id number] Yes Take 150mg  in the AM, 150mg  in the PM and 2 tabs at bedtime to be taken with her 50mg  tabs in the AM and PM for 150mg  total Johnson, Megan P, DO Taking Active   pregabalin (LYRICA) 50 MG capsule [id number] Yes Take 1 capsule (50 mg total) by mouth 2 (two) times daily. With her 100mg  for 150mg  total Laural Benes, Megan P, DO Taking Active   tiZANidine (ZANAFLEX) 4 MG tablet [id number]  Take 1 tablet (4 mg total) by mouth every 6 (six) hours as needed for muscle spasms. OK to  take 2 at bedtime Laural Benes, Megan P, DO  Active   Turmeric (QC TUMERIC COMPLEX PO) [id number] Yes Take 1,500 mg by mouth at bedtime. [provider] Taking Active             Patient Active Problem List   Diagnosis Date Noted   Exposure to influenza 05/23/2022   Migraine 12/22/2017   Plantar fasciitis 08/04/2017   Atypical nevi 02/26/2017   Knee pain, left 01/28/2017   Allergic rhinitis with postnasal drip 01/28/2017   Depression, major, single episode, severe (HCC) 01/28/2017   PTSD (post-traumatic stress disorder) 01/28/2017   Shoulder pain 01/28/2017   Anxiety 01/28/2017   Fibromyalgia 01/28/2017   Primary insomnia 01/28/2017   Smoker 08/04/2014   Abnormal uterine bleeding (AUB) 06/02/2014   Obesity, Class III, BMI 40-49.9 (morbid obesity) (HCC) 02/01/2014   Hiatal hernia  02/01/2014   Esophageal reflux 02/01/2014    Conditions to be addressed/monitored per PCP order:   Grief  Care Plan : LCSW Plan of Care  Updates made by Gustavus Bryant, LCSW since 09/09/2022 12:00 AM     Problem: Depression Identification (Depression)      Goal: Depressive Symptoms Identified   Start Date: 08/27/2022  Priority: High  Note:   Priority: High  Timeframe:  Short Range Goal Priority:  High Start Date:   08/27/22             Expected End Date:  ongoing                     Follow Up Date- 09/27/22 at 10:15 am  - check out bereavement counseling and long term counseling options resources sent to you by email  - keep 90 percent of all appointments  Why is this important?             Beating grief and depression may take some time.            If you don't feel better right away, don't give up on your treatment plan.    Current barriers:   Chronic Mental Health needs related to depression, anxiety and grief from losing her child recently. Patient requires Support, Education, Resources, Referrals, Advocacy, and Care Coordination, in order to meet Unmet Mental Health Needs. Patient will implement clinical interventions discussed today to decrease symptoms of grief and increase knowledge and/or ability of: coping skills. Mental Health Concerns and Social Isolation Patient lacks knowledge of available community counseling agencies and resources.  Clinical Goal(s): verbalize understanding of plan for management of Grief, Anxiety, Depression, and Stress and demonstrate a reduction in symptoms. Patient will connect with a provider for ongoing mental health treatment, increase coping skills, healthy habits, self-management skills, and stress reduction. Patient already has a stable psychiatrist that she sees regularly.     Clinical Interventions:  Assessed patient's previous and current treatment, coping skills, support system and barriers to care. Patient recently lost her son  Sydney Valenzuela by murder last month. Patient has a daughter who is battling substance abuse.  Patient actively sees her psychiatrist at [facility]. However, we are unsure if there are therapist there which is the service she is seeking right now.  Patient and her spouse recently transitioned from living with their church friend to a new apartment on 08/02/22.  Patient reported having issues with stable housing for the last several years.  Patient is very involved in the church. Verbalization of feelings encouraged, motivational interviewing employed Emotional support provided,  positive coping strategies explored Self care emphasized Patient is agreeable to referral to bereavement counseling with [facility]. [facility] LCSW left a message with the referral on their voice mail. [facility] LCSW sent patient and email with grief support resources on 08/27/22.  Patient reports significant worsening grief impacting her ability to function appropriately and carry out daily task. LCSW provided education on relaxation techniques such as meditation, deep breathing, massage, grounding exercises or yoga that can activate the body's relaxation response and ease symptoms of extreme grief, anxiety and stress. LCSW ask that when pt is struggling with difficult emotions and racing thoughts that they start this relaxation response process. LCSW provided extensive education on healthy coping skills for anxiety. SW used active and reflective listening, validated patient's feelings/concerns, and provided emotional support. Patient will work on implementing appropriate self-care habits into their daily routine such as: staying positive, writing a gratitude list, drinking water, staying active around the house, taking their medications as prescribed, combating negative thoughts or emotions and staying connected with their family and friends. Positive reinforcement provided for this decision to work on this. [facility] LCSW  advised patient to start putting breathing/grounding/meditation/self-care exercises into her nightly routine to combat racing thoughts at night. LCSW encouraged patient to wake up at the same time each day, make their sleeping environment comfortable, exercise when able, to limit naps and to not eat or drink anything right before bed.  Motivational Interviewing employed Depression screen reviewed  PHQ2/ PHQ9 completed Mindfulness or Relaxation training provided Active listening / Reflection utilized  Advance Care and HCPOA education provided Emotional Support Provided Problem Solving /Task Center strategies reviewed Provided psychoeducation for mental health needs  Provided brief CBT , patient is familiar with CBT and has benefited from this intervention in therapy before Reviewed mental health medications and discussed importance of compliance:  Quality of sleep assessed & Sleep Hygiene techniques promoted  Participation in counseling encouraged  Verbalization of feelings encouraged  Suicidal Ideation/Homicidal Ideation assessed: Patient denies SI/HI  Review resources, discussed options and provided patient information about  Mental Health Resources Inter-disciplinary care team collaboration (see longitudinal plan of care) 09/03/22- Patient reports that she is feeling more stable but admits to ongoing frequent mood swifts when reminders of her son come up. She reports that she has really not done much over the last week since talking with [facility] LCSW due to getting physically sick. She reports that she will check her email to look at resources that [facility] LCSW sent last week and to check and see if [facility] had emailed/called her yet for grief counseling. Patient is agreeable to referral to [facility]. Referral made today by [facility] LCSW on 09/03/22. 09/09/22 update- Patient reports having moments where her grief hits her and almost immobiles her. Patient reports that she had a missed call from  [facility] but not [facility]. [facility] LCSW informed patient that [facility] denied her referral due to insurance coverage and we would need to make an additional referral elsewhere. Patient will review her resource list and make a decision. Patient agreeable to contact both agencies today to follow up on her referrals. Patient was provided brief emotional support and coping skill education. Patient reports that she has started her healing journey as of today.   Patient Goals/Self-Care Activities: Over the next 120 days Attend scheduled medical appointments Utilize healthy coping skills and supportive resources discussed Contact PCP with any questions or concerns Keep 90 percent of counseling appointments Call your insurance provider for more  information about your Enhanced Benefits  Check out counseling resources provided  Begin personal counseling with LCSW, to reduce and manage symptoms of Depression and Stress, until well-established with mental health provider Accept all calls from representative at [facility] as an effort to establish ongoing mental health counseling and supportive grief services.  Incorporate into daily practice - relaxation techniques, deep breathing exercises, and mindfulness meditation strategies. Talk about feelings with friends, family members, spiritual advisor, etc. Contact LCSW directly [phone]), if you have questions, need assistance, or if additional social work needs are identified between now and our next scheduled telephone outreach call. Call [phone] for mental health hotline/crisis line if needed (24/7 available) Try techniques to reduce symptoms of anxiety/negative thinking (deep breathing, distraction, positive self talk, etc)  - develop a personal safety plan - develop a plan to deal with triggers like holidays, anniversaries - exercise at least 2 to 3 times per week - have a plan for how to handle bad days - journal feelings and what helps  to feel better or worse - spend time or talk with others at least 2 to 3 times per week - watch for early signs of feeling worse - begin personal counseling - call and visit an old friend - check out volunteer opportunities - join a support group - laugh; watch a funny movie or comedian - learn and use visualization or guided imagery - perform a random act of kindness - practice relaxation or meditation daily - start or continue a personal journal - practice positive thinking and self-talk -continue with compliance of taking medication  -identify current effective and ineffective coping strategies.  -implement positive self-talk in care to increase self-esteem, confidence and feelings of control.  -consider alternative and complementary therapy approaches such as meditation, mindfulness or yoga.  -journaling, prayer, worship services, meditation or pastoral counseling.  -increase participation in pleasurable group activities such as hobbies, singing, sports or volunteering).  -consider the use of meditative movement therapy such as tai chi, yoga or qigong.  -start a regular daily exercise program based on tolerance, ability and patient choice to support positive thinking and activity     Managing Loss, Adult People experience loss in many different ways throughout their lives. Events such as moving, changing jobs, and losing friends can create a sense of loss. The loss may be as serious as a major health change, divorce, death of a pet, or death of a loved one. All of these types of loss are likely to create a physical and emotional reaction known as grief. Grief is the result of a major change or an absence of something or someone that you count on. Grief is a normal reaction to loss. A variety of factors can affect your grieving experience, including: The nature of your loss. Your relationship to what or whom you lost. Your understanding of grief and how to manage it. Your support  system. How to manage lifestyle changes Keep to your normal routine as much as possible. If you have trouble focusing or doing normal activities, it is acceptable to take some time away from your normal routine. Spend time with friends and loved ones. Eat a healthy diet, get plenty of sleep, and rest when you feel tired. How to recognize changes  The way that you deal with your grief will affect your ability to function as you normally do. When grieving, you may experience these changes: Numbness, shock, sadness, anxiety, anger, denial, and guilt. Thoughts about death. Unexpected crying. A  physical sensation of emptiness in your stomach. Problems sleeping and eating. Tiredness (fatigue). Loss of interest in normal activities. Dreaming about or imagining seeing the person who died. A need to remember what or whom you lost. Difficulty thinking about anything other than your loss for a period of time. Relief. If you have been expecting the loss for a while, you may feel a sense of relief when it happens. Follow these instructions at home:    Activity Express your feelings in healthy ways, such as: Talking with others about your loss. It may be helpful to find others who have had a similar loss, such as a support group. Writing down your feelings in a journal. Doing physical activities to release stress and emotional energy. Doing creative activities like painting, sculpting, or playing or listening to music. Practicing resilience. This is the ability to recover and adjust after facing challenges. Reading some resources that encourage resilience may help you to learn ways to practice those behaviors. General instructions Be patient with yourself and others. Allow the grieving process to happen, and remember that grieving takes time. It is likely that you may never feel completely done with some grief. You may find a way to move on while still cherishing memories and feelings about your  loss. Accepting your loss is a process. It can take months or longer to adjust. Keep all follow-up visits as told by your health care provider. This is important. Where to find support To get support for managing loss: Ask your health care provider for help and recommendations, such as grief counseling or therapy. Think about joining a support group for people who are managing a loss. Where to find more information You can find more information about managing loss from: American Society of Clinical Oncology: www.cancer.net American Psychological Association: [employer].ca Contact a health care provider if: Your grief is extreme and keeps getting worse. You have ongoing grief that does not improve. Your body shows symptoms of grief, such as illness. You feel depressed, anxious, or lonely. Get help right away if: You have thoughts about hurting yourself or others. If you ever feel like you may hurt yourself or others, or have thoughts about taking your own life, get help right away. You can go to your nearest emergency department or call: Your local emergency services (911 in the [place] A suicide crisis helpline, such as the National Suicide Prevention Lifeline at 6313896681. This is open 24 hours a day. Summary Grief is the result of a major change or an absence of someone or something that you count on. Grief is a normal reaction to loss. The depth of grief and the period of recovery depend on the type of loss and your ability to adjust to the change and process your feelings. Processing grief requires patience and a willingness to accept your feelings and talk about your loss with people who are supportive. It is important to find resources that work for you and to realize that people experience grief differently. There is not one grieving process that works for everyone in the same way. Be aware that when grief becomes extreme, it can lead to more severe issues like isolation,  depression, anxiety, or suicidal thoughts. Talk with your health care provider if you have any of these issues. This information is not intended to replace advice given to you by your health care provider. Make sure you discuss any questions you have with your health care provider. Document Revised: 05/22/2018 Document Reviewed: 08/01/2016 Elsevier Patient  Education  [employer].  Help for Managing Grief   When you are experiencing grief, many resources and support are available to help you. You are not alone.    Grief Share (https://www.SunglassSpecialist.gl):    [employer] of [address] [place], [place]      [employer]  [phone] [address] [place], [place]     [employer] [address] [place], [place]      [facility] [address] [place], [place]  [phone]   [facility] [address] [place], [place] [phone]   [facility] [address] [place], [place]     [phone]   [facility] [address] [place], [place]     [phone]   If a Hospice or Palliative Care team has been involved in the care of your loved one, you may reach out to your contact there or locally, you may reach out to [facility]:    [facility] [place] and [facility]   [address] [place], [place] [address] [phone]   [facility]  [facility] in [place], [place]  [address], [place], [place] [address] Phone: [phone]  Crisis Support Resources     24- Hour Availability:    [facility]  [address] [place], [place] [phone] Crisis [phone]   [facility] [phone]  [facility]  [phone]    [facility]  [phone] (after hours)   Therapeutic  Alternative/Mobile Crisis   [phone]   [place] National Suicide Hotline  [phone] [place]) [place] 854   Call 33 for mental health emergencies   [facility]  [phone]);  [place] and [employer]  [phone]); [place], [place], [place], [place], [place], [place], [place]    [facility] Residents For 24/7 walk-up access to mental health services for [facility] children (4+), adolescents and adults, please visit the [facility] located at [address] in [place], [place].  *[facility] also provides comprehensive outpatient behavioral health services in a variety of locations around the [place].  Connect With [place] [address] [place], [place] [address] HelpLine: (514)866-9922 or [phone]  Get Directions  Find Help 24/7 By Phone Call our 24-hour HelpLine at 7026116358 or 757-156-7209 for immediate assistance for mental health and substance abuse issues.  Walk-In Help [place]: [facility] (Ages 4 and Up) [place]: Emergency Dept., [facility] Additional Resources National Hopeline Network: 1-800-SUICIDE The National Suicide Prevention Lifeline: 1-800-273-TALK    Follow up goal     Follow up:  Patient agrees to Care Plan and Follow-up.  Plan: The Managed Medicaid care management team will reach out to the patient again over the next 30 days.  Dickie La, BSW, MSW, [employer] Managed Medicaid LCSW [facility]  [facility] [email or web address] Phone: [phone]

## 2022-09-13 ENCOUNTER — Ambulatory Visit: Payer: Medicare Other | Admitting: Family Medicine

## 2022-09-27 ENCOUNTER — Other Ambulatory Visit: Payer: Medicare Other | Admitting: Licensed Clinical Social Worker

## 2022-09-27 NOTE — Patient Outreach (Signed)
  Medicaid Managed Care   Unsuccessful Attempt Note   09/27/2022 Name: Sydney Valenzuela MRN: [id number] DOB: 08/22/1980  Referred by: Dorcas Carrow, DO Reason for referral : No chief complaint on file.   An unsuccessful telephone outreach was attempted today. The patient was referred to the case management team for assistance with care management and care coordination.    Follow Up Plan: A HIPAA compliant phone message was left for the patient providing contact information and requesting a return call. Email sent as well to patient.  Dickie La, BSW, MSW, [employer] Managed Medicaid LCSW [facility]  [facility] [email or web address] Phone: [phone]

## 2022-09-27 NOTE — Patient Instructions (Signed)
  Sydney Valenzuela ,   The [facility] Managed Care Team is available to provide assistance to you with your healthcare needs at no cost and as a benefit of your [facility] Health plan. I'm sorry I was unable to reach you today for our scheduled appointment. Our care guide will call you to reschedule our telephone appointment. Please call me at the number below. I am available to be of assistance to you regarding your healthcare needs. .   Thank you,   Dickie La, BSW, MSW, LCSW Managed Medicaid LCSW [facility]  [address] [email or web address] Phone: [phone]

## 2022-10-14 ENCOUNTER — Other Ambulatory Visit: Payer: Medicare Other | Admitting: Licensed Clinical Social Worker

## 2022-10-14 NOTE — Patient Outreach (Signed)
  Medicaid Managed Care   Unsuccessful Attempt Note   10/14/2022 Name: Sydney Valenzuela MRN: [id number] DOB: 22-Oct-1980  Referred by: Dorcas Carrow, DO Reason for referral : No chief complaint on file.   An unsuccessful telephone outreach was attempted today. The patient was referred to the case management team for assistance with care management and care coordination.    Follow Up Plan: A HIPAA compliant phone message was left for the patient providing contact information and requesting a return call.   Dickie La, BSW, MSW, [employer] Managed Medicaid LCSW [facility]  [facility] [email or web address] Phone: [phone]

## 2022-10-15 NOTE — Patient Instructions (Signed)
  [name] ,   The [facility] Managed Care Team is available to provide assistance to you with your healthcare needs at no cost and as a benefit of your [facility] Health plan. I'm sorry I was unable to reach you today for our scheduled appointment. Our care guide will call you to reschedule our telephone appointment. Please call me at the number below. I am available to be of assistance to you regarding your healthcare needs. .   Thank you,   Dickie La, BSW, MSW, LCSW Managed Medicaid LCSW [facility]  [address] [email or web address] Phone: [phone]

## 2022-10-24 ENCOUNTER — Telehealth: Payer: Self-pay

## 2022-10-24 NOTE — Telephone Encounter (Signed)
I called Sydney Valenzuela today to get her phone appointment with the MM LCSW rescheduled. Sydney Valenzuela asked if she could call me on Monday to reschedule. We agreed if I don't hear from her on Monday that I will reach out to her on Tuesday.   Weston Settle Care Guide  [facility] Managed  Care Guide [facility]  [phone]

## 2022-10-29 ENCOUNTER — Telehealth: Payer: Self-pay

## 2022-10-29 NOTE — Telephone Encounter (Signed)
..   Medicaid Managed Care   Unsuccessful Outreach Note  10/29/2022 Name: Sydney Valenzuela MRN: [id number] DOB: [birth year]  Referred by: Dorcas Carrow, DO Reason for referral : Appointment   Third unsuccessful telephone outreach was attempted today. The patient was referred to the case management team for assistance with care management and care coordination. The patient's primary care provider has been notified of our unsuccessful attempts to make or maintain contact with the patient. The care management team is pleased to engage with this patient at any time in the future should he/she be interested in assistance from the care management team.   Follow Up Plan: A HIPAA compliant phone message was left for the patient providing contact information and requesting a return call.  The care management team will reach out to the patient again over the next 7 days.   Weston Settle Care Guide  [facility] Managed  Care Guide [facility]  [phone]

## 2022-11-20 ENCOUNTER — Telehealth (INDEPENDENT_AMBULATORY_CARE_PROVIDER_SITE_OTHER): Payer: Medicare Other | Admitting: Physician Assistant

## 2022-11-20 ENCOUNTER — Encounter: Payer: Self-pay | Admitting: Physician Assistant

## 2022-11-20 DIAGNOSIS — J019 Acute sinusitis, unspecified: Secondary | ICD-10-CM | POA: Diagnosis not present

## 2022-11-20 DIAGNOSIS — R110 Nausea: Secondary | ICD-10-CM

## 2022-11-20 DIAGNOSIS — R0602 Shortness of breath: Secondary | ICD-10-CM

## 2022-11-20 MED ORDER — AZITHROMYCIN 250 MG PO TABS
ORAL_TABLET | ORAL | 0 refills | Status: DC
Start: 2022-11-20 — End: 2022-11-25

## 2022-11-20 MED ORDER — PREDNISONE 20 MG PO TABS
ORAL_TABLET | ORAL | 0 refills | Status: DC
Start: 2022-11-20 — End: 2022-11-25

## 2022-11-20 MED ORDER — PROMETHAZINE-DM 6.25-15 MG/5ML PO SYRP
5.0000 mL | ORAL_SOLUTION | Freq: Four times a day (QID) | ORAL | 0 refills | Status: DC | PRN
Start: 2022-11-20 — End: 2022-11-25

## 2022-11-20 MED ORDER — AMOXICILLIN-POT CLAVULANATE 875-125 MG PO TABS
1.0000 | ORAL_TABLET | Freq: Two times a day (BID) | ORAL | 0 refills | Status: DC
Start: 2022-11-20 — End: 2022-11-25

## 2022-11-20 NOTE — Progress Notes (Signed)
Virtual Visit via Video Note  I connected with Sydney Valenzuela on 11/20/22 at 10:00 AM EDT by a video enabled telemedicine application and verified that I am speaking with the correct person using two identifiers.  Today's Provider: Jacquelin Hawking, MHS, PA-C Introduced myself to the patient as a PA-C and provided education on APPs in clinical practice.   Location: Patient: at home  Provider: [facility], Sydney Valenzuel[place], [place]    I discussed the limitations of evaluation and management by telemedicine and the availability of in person appointments. The patient expressed understanding and agreed to proceed.    Chief Complaint  Patient presents with   URI    Pt states she has been having a cough, congestion, sore throat, and headache for the past 2 weeks.     History of Present Illness:    Concern for URI   She reports increased stress lately and feels like she has been developing a URI She reports having sore throat for several days then this progressed to myalgias and SOB, coughing, sinus pressure and pain  She is having productive cough and rhinorrhea  She states she does feel a heaviness in her chest when she tries to breathe She states she feels like the bottom of her lungs feels like there is something there  She reports nausea and lightheadedness   Onset: gradual  Duration: ongoing for about 2 weeks  She tried taking Dayquil and Nyquil      Review of Systems  Constitutional:  Positive for chills and malaise/fatigue. Negative for fever.  HENT:  Positive for congestion, sinus pain and sore throat.   Respiratory:  Positive for cough and shortness of breath.   Gastrointestinal:  Positive for nausea.  Musculoskeletal:  Positive for myalgias.  Neurological:  Positive for dizziness.    Observations/Objective:   Due to the nature of the virtual visit, physical exam and observations are limited. Able to obtain the following observations:   Alert, oriented, x  3 Appears comfortable, in no acute distress.  No scleral injection, no appreciated hoarseness, tachypnea, wheeze or strider. Able to maintain conversation without visible strain.   cough appreciated during visit.     Assessment and Plan:   Problem List Items Addressed This Visit   None Visit Diagnoses     Acute sinusitis, recurrence not specified, unspecified location    -  Primary Acute, new concern Patient reports gradually developing and worsening symptoms over the past 2 weeks comprised of sore throat, sinus pressure, productive coughing, SOB and chills, fatigue Due to nature of virtual visit, unable to assess lung sounds or test for strep, COVID or flu She is outside the therapeutic window for COVID and Flu at this time.  Suspect bacterial sinusitis at this time but cannot rule out pneumonia given SOB and productive coughing Will send in scripts for Augmentin PO BID x 10 days and Zpack to provide coverage for sinusitis, strep,  and pneumonia Will send script for Prednisone taper to assist further with SOB She is reporting nausea with increased sputum production and coughing so will send Promethazine-DM syrup to assist with this as well. Reviewed ED and Return precautions If not improving she will need office apt and potential CXR for evaluation Follow up as needed for persistent or progressing symptoms     Relevant Medications   azithromycin (ZITHROMAX) 250 MG tablet   amoxicillin-clavulanate (AUGMENTIN) 875-125 MG tablet   promethazine-dextromethorphan (PROMETHAZINE-DM) 6.25-15 MG/5ML syrup   predniSONE (DELTASONE) 20  MG tablet   SOB (shortness of breath)       Relevant Medications   predniSONE (DELTASONE) 20 MG tablet   Nausea       Relevant Medications   promethazine-dextromethorphan (PROMETHAZINE-DM) 6.25-15 MG/5ML syrup       Follow Up Instructions:    I discussed the assessment and treatment plan with the patient. The patient was provided an opportunity to ask  questions and all were answered. The patient agreed with the plan and demonstrated an understanding of the instructions.   The patient was advised to call back or seek an in-person evaluation if the symptoms worsen or if the condition fails to improve as anticipated.  I provided 9 minutes of non-face-to-face time during this encounter.  No follow-ups on file.   I, Sydney Balla E Rosalea Withrow, PA-C, have reviewed all documentation for this visit. The documentation on 11/20/22 for the exam, diagnosis, procedures, and orders are all accurate and complete.   Jacquelin Hawking, MHS, PA-C [facility]

## 2022-11-25 ENCOUNTER — Ambulatory Visit (INDEPENDENT_AMBULATORY_CARE_PROVIDER_SITE_OTHER): Payer: Medicare Other | Admitting: Family Medicine

## 2022-11-25 ENCOUNTER — Encounter: Payer: Self-pay | Admitting: Family Medicine

## 2022-11-25 VITALS — BP 127/82 | HR 80 | Ht 63.0 in | Wt 236.6 lb

## 2022-11-25 DIAGNOSIS — Z8249 Family history of ischemic heart disease and other diseases of the circulatory system: Secondary | ICD-10-CM

## 2022-11-25 DIAGNOSIS — F17200 Nicotine dependence, unspecified, uncomplicated: Secondary | ICD-10-CM

## 2022-11-25 DIAGNOSIS — Z136 Encounter for screening for cardiovascular disorders: Secondary | ICD-10-CM | POA: Diagnosis not present

## 2022-11-25 DIAGNOSIS — R0789 Other chest pain: Secondary | ICD-10-CM

## 2022-11-25 DIAGNOSIS — K219 Gastro-esophageal reflux disease without esophagitis: Secondary | ICD-10-CM

## 2022-11-25 DIAGNOSIS — F322 Major depressive disorder, single episode, severe without psychotic features: Secondary | ICD-10-CM

## 2022-11-25 DIAGNOSIS — Z131 Encounter for screening for diabetes mellitus: Secondary | ICD-10-CM

## 2022-11-25 DIAGNOSIS — J019 Acute sinusitis, unspecified: Secondary | ICD-10-CM | POA: Diagnosis not present

## 2022-11-25 DIAGNOSIS — Z0000 Encounter for general adult medical examination without abnormal findings: Secondary | ICD-10-CM

## 2022-11-25 DIAGNOSIS — Z7189 Other specified counseling: Secondary | ICD-10-CM

## 2022-11-25 DIAGNOSIS — M797 Fibromyalgia: Secondary | ICD-10-CM

## 2022-11-25 DIAGNOSIS — F17210 Nicotine dependence, cigarettes, uncomplicated: Secondary | ICD-10-CM | POA: Diagnosis not present

## 2022-11-25 DIAGNOSIS — Z6841 Body Mass Index (BMI) 40.0 and over, adult: Secondary | ICD-10-CM

## 2022-11-25 LAB — URINALYSIS, ROUTINE W REFLEX MICROSCOPIC
Bilirubin, UA: NEGATIVE
Glucose, UA: NEGATIVE
Ketones, UA: NEGATIVE
Leukocytes,UA: NEGATIVE
Nitrite, UA: NEGATIVE
Protein,UA: NEGATIVE
RBC, UA: NEGATIVE
Specific Gravity, UA: 1.02 (ref 1.005–1.030)
Urobilinogen, Ur: 0.2 mg/dL (ref 0.2–1.0)
pH, UA: 8 — ABNORMAL HIGH (ref 5.0–7.5)

## 2022-11-25 LAB — BAYER DCA HB A1C WAIVED: HB A1C (BAYER DCA - WAIVED): 5.7 % — ABNORMAL HIGH (ref 4.8–5.6)

## 2022-11-25 MED ORDER — TIZANIDINE HCL 4 MG PO TABS: 4.0000 mg | ORAL_TABLET | Freq: Four times a day (QID) | ORAL | 3 refills | Status: DC | PRN

## 2022-11-25 MED ORDER — FAMOTIDINE 20 MG PO TABS: 20.0000 mg | ORAL_TABLET | Freq: Two times a day (BID) | ORAL | 1 refills | Status: DC

## 2022-11-25 MED ORDER — PREGABALIN 100 MG PO CAPS: ORAL_CAPSULE | ORAL | 5 refills | Status: DC

## 2022-11-25 MED ORDER — PANTOPRAZOLE SODIUM 40 MG PO TBEC: 40.0000 mg | DELAYED_RELEASE_TABLET | Freq: Two times a day (BID) | ORAL | 1 refills | Status: DC

## 2022-11-25 MED ORDER — METHOCARBAMOL 500 MG PO TABS: 500.0000 mg | ORAL_TABLET | Freq: Three times a day (TID) | ORAL | 3 refills | Status: DC | PRN

## 2022-11-25 MED ORDER — PREGABALIN 50 MG PO CAPS: 50.0000 mg | ORAL_CAPSULE | Freq: Two times a day (BID) | ORAL | 5 refills | Status: DC

## 2022-11-25 NOTE — Progress Notes (Unsigned)
BP 127/82   Pulse 80   Ht 5\' 3"  (1.6 m)   Wt 236 lb 9.6 oz (107.3 kg)   SpO2 97%   BMI 41.91 kg/m    Subjective:    Patient ID: Sydney Valenzuela, female    DOB: 23-Jul-1980, 42 y.o.   MRN: [id number]  HPI: Sydney Valenzuela is a 42 y.o. female presenting on 11/25/2022 for comprehensive medical examination. Current medical complaints include:{Blank single:19197::"none","***"}  She currently lives with: husband and kids Menopausal Symptoms: no  Functional Status Survey: Is the patient deaf or have difficulty hearing?: No Does the patient have difficulty seeing, even when wearing glasses/contacts?: Yes Does the patient have difficulty concentrating, remembering, or making decisions?: Yes Does the patient have difficulty walking or climbing stairs?: Yes Does the patient have difficulty dressing or bathing?: Yes Does the patient have difficulty doing errands alone such as visiting a doctor's office or shopping?: Yes     11/25/2022    3:30 PM 08/16/2022   10:05 AM 05/23/2022    1:12 PM 05/09/2022   10:24 AM 12/18/2021    1:12 PM  Fall Risk   Falls in the past year? 1 1 0 1 1  Number falls in past yr: 1 1 0 1 1  Injury with Fall? 1 1 0 1 1  Risk for fall due to : History of fall(s) History of fall(s) No Fall Risks History of fall(s) History of fall(s)  Follow up Falls evaluation completed Falls evaluation completed Falls evaluation completed Falls evaluation completed Falls evaluation completed    Depression Screen    11/25/2022    3:34 PM 08/27/2022    9:26 AM 08/16/2022   10:05 AM 05/23/2022    1:12 PM 05/09/2022   10:24 AM  Depression screen PHQ 2/9  Decreased Interest 2 3 3 2 3   Down, Depressed, Hopeless 2 3 3 1 3   PHQ - 2 Score 4 6 6 3 6   Altered sleeping 3 3 3 2 3   Tired, decreased energy 3 3 3 3 3   Change in appetite 3 3 3 3 3   Feeling bad or failure about yourself  3 3 3 3 3   Trouble concentrating 3 3 3 2 3   Moving slowly or fidgety/restless 2 3 3 2 3   Suicidal thoughts 1 1  3 1 2   PHQ-9 Score 22 25 27 19 26   Difficult doing work/chores Extremely dIfficult Extremely dIfficult Extremely dIfficult Extremely dIfficult Extremely dIfficult   Advanced Directives Does patient have a HCPOA?    no If yes, name and contact information:  Does patient have a living will or MOST form?  no  Past Medical History:  Past Medical History:  Diagnosis Date   Allergy    Anxiety    Arthritis    Family history of adverse reaction to anesthesia    N/V, difficult to wake up   Hiatal hernia    Hypertension     Surgical History:  Past Surgical History:  Procedure Laterality Date   CHOLECYSTECTOMY N/A 11/21/2016   Procedure: LAPAROSCOPIC CHOLECYSTECTOMY WITH INTRAOPERATIVE CHOLANGIOGRAM;  Surgeon: Kieth Brightly, MD;  Location: [facility] ORS;  Service: General;  Laterality: N/A;   ESOPHAGOGASTRODUODENOSCOPY (EGD) WITH PROPOFOL N/A 11/04/2016   Procedure: ESOPHAGOGASTRODUODENOSCOPY (EGD) WITH PROPOFOL;  Surgeon: Scot Jun, MD;  Location: [facility] ENDOSCOPY;  Service: Endoscopy;  Laterality: N/A;   LEEP     NASAL SINUS SURGERY     TONSILLECTOMY      Medications:  Current Outpatient  Medications on File Prior to Visit  Medication Sig   acetaminophen (TYLENOL) 500 MG tablet Take 500-1,000 mg by mouth daily as needed for moderate pain or headache.   albuterol (PROVENTIL) (2.5 MG/3ML) 0.083% nebulizer solution Take 3 mLs (2.5 mg total) by nebulization every 6 (six) hours as needed for wheezing or shortness of breath.   albuterol (VENTOLIN HFA) 108 (90 Base) MCG/ACT inhaler Inhale 2 puffs into the lungs every 6 (six) hours as needed for wheezing or shortness of breath.   ALPRAZolam (XANAX) 0.5 MG tablet Take 0.5 mg by mouth 2 (two) times daily as needed.   aspirin-acetaminophen-caffeine (EXCEDRIN MIGRAINE) 250-250-65 MG tablet Take by mouth every 6 (six) hours as needed for headache.   cetirizine (ZYRTEC) 10 MG tablet Take 1 tablet (10 mg total) by mouth at bedtime.    ciclopirox (PENLAC) 8 % solution Apply topically at bedtime. Apply over nail and surrounding skin. Apply daily over previous coat. After seven (7) days, may remove with alcohol and continue cycle.   desvenlafaxine (PRISTIQ) 100 MG 24 hr tablet Take 100 mg by mouth daily.   diphenhydrAMINE HCl (BENADRYL ALLERGY PO) Take by mouth 2 (two) times daily.   famotidine (PEPCID) 20 MG tablet Take 1 tablet (20 mg total) by mouth 2 (two) times daily.   Ginger, Zingiber officinalis, (GINGER ROOT) 550 MG CAPS Take 550 mg by mouth 2 (two) times daily. TID in the A.M. TID in the P.M.   glucosamine-chondroitin 500-400 MG tablet Take 1 tablet by mouth 3 (three) times daily.   lamoTRIgine (LAMICTAL) 150 MG tablet Take 150 mg by mouth 2 (two) times daily.   lidocaine (XYLOCAINE) 2 % solution Use as directed 15 mLs in the mouth or throat every 4 (four) hours as needed for mouth pain.   methocarbamol (ROBAXIN) 500 MG tablet Take 1 tablet (500 mg total) by mouth every 8 (eight) hours as needed for muscle spasms.   Multiple Vitamins-Minerals (MULTIVITAMIN WITH MINERALS) tablet Take 1 tablet by mouth daily.   nicotine (NICODERM CQ) 14 mg/24hr patch Place 1 patch (14 mg total) onto the skin daily.   nicotine (NICODERM CQ) 21 mg/24hr patch Place 1 patch (21 mg total) onto the skin daily.   nicotine (NICODERM CQ) 7 mg/24hr patch Place 1 patch (7 mg total) onto the skin daily.   nicotine polacrilex (NICOTINE MINI) 2 MG lozenge Take 1 lozenge (2 mg total) by mouth as needed for smoking cessation. No more than 10 a day   pantoprazole (PROTONIX) 40 MG tablet Take 1 tablet (40 mg total) by mouth 2 (two) times daily.   prazosin (MINIPRESS) 2 MG capsule Take 2 mg by mouth 3 (three) times daily.   pregabalin (LYRICA) 100 MG capsule Take 150mg  in the AM, 150mg  in the PM and 2 tabs at bedtime to be taken with her 50mg  tabs in the AM and PM for 150mg  total   pregabalin (LYRICA) 50 MG capsule Take 1 capsule (50 mg total) by mouth 2 (two)  times daily. With her 100mg  for 150mg  total   tiZANidine (ZANAFLEX) 4 MG tablet Take 1 tablet (4 mg total) by mouth every 6 (six) hours as needed for muscle spasms. OK to take 2 at bedtime   Turmeric (QC TUMERIC COMPLEX PO) Take 1,500 mg by mouth at bedtime.   No current facility-administered medications on file prior to visit.    Allergies:  Allergies  Allergen Reactions   Codeine Itching    Initial dose is the worst, but  becomes more tolerable with more doses, severe itching and tongue swelling    Social History:  Social History   Socioeconomic History   Marital status: Married    Spouse name: Not on file   Number of children: Not on file   Years of education: Not on file   Highest education level: Not on file  Occupational History   Not on file  Tobacco Use   Smoking status: Former    Current packs/day: 1.00    Types: Cigarettes   Smokeless tobacco: Never  Vaping Use   Vaping status: Every Day  Substance and Sexual Activity   Alcohol use: Yes    Comment: occasional   Drug use: No   Sexual activity: Yes    Birth control/protection: I.U.D.  Other Topics Concern   Not on file  Social History Narrative   Not on file   Social Determinants of Health   Financial Resource Strain: Not on file  Food Insecurity: Not on file  Transportation Needs: Not on file  Physical Activity: Not on file  Stress: Stress Concern Present (09/09/2022)   [employer] of Occupational Health - Occupational Stress Questionnaire    Feeling of Stress : To some extent  Social Connections: Not on file  Intimate Partner Violence: Not on file   Social History   Tobacco Use  Smoking Status Former   Current packs/day: 1.00   Types: Cigarettes  Smokeless Tobacco Never   Social History   Substance and Sexual Activity  Alcohol Use Yes   Comment: occasional    Family History:  Family History  Problem Relation Age of Onset   Hypertension Mother    Allergies Mother     Hyperlipidemia Mother    Anxiety disorder Daughter    Cancer Maternal Uncle        prostate   Diabetes Maternal Uncle    Heart disease Maternal Uncle    Allergies Maternal Grandmother    COPD Maternal Grandmother    Alzheimer's disease Maternal Grandmother    Diabetes Maternal Grandfather    Heart disease Maternal Grandfather    Hypertension Son    Breast cancer Neg Hx     Past medical history, surgical history, medications, allergies, family history and social history reviewed with patient today and changes made to appropriate areas of the chart.   Review of Systems  Constitutional:  Positive for diaphoresis. Negative for chills, fever, malaise/fatigue and weight loss.  HENT:  Positive for congestion. Negative for ear discharge, ear pain, hearing loss, nosebleeds, sinus pain, sore throat and tinnitus.   Eyes:  Positive for blurred vision. Negative for double vision, photophobia, pain, discharge and redness.  Respiratory: Negative.  Negative for stridor.   Cardiovascular:  Positive for chest pain, palpitations and leg swelling. Negative for orthopnea, claudication and PND.  Gastrointestinal:  Positive for constipation, diarrhea, heartburn and nausea. Negative for abdominal pain, blood in stool, melena and vomiting.  Genitourinary:  Positive for frequency. Negative for dysuria, flank pain, hematuria and urgency.  Musculoskeletal:  Positive for back pain and myalgias. Negative for falls, joint pain and neck pain.  Skin: Negative.   Neurological:  Positive for dizziness. Negative for tingling, tremors, sensory change, speech change, focal weakness, seizures, loss of consciousness, weakness and headaches.  Endo/Heme/Allergies:  Negative for environmental allergies and polydipsia. Bruises/bleeds easily.  Psychiatric/Behavioral:  Positive for depression. Negative for hallucinations, memory loss, substance abuse and suicidal ideas. The patient is nervous/anxious. The patient does not have  insomnia.  All other ROS negative except what is listed above and in the HPI.      Objective:    BP 127/82   Pulse 80   Ht 5\' 3"  (1.6 m)   Wt 236 lb 9.6 oz (107.3 kg)   SpO2 97%   BMI 41.91 kg/m   Wt Readings from Last 3 Encounters:  11/25/22 236 lb 9.6 oz (107.3 kg)  08/16/22 235 lb 12.8 oz (107 kg)  05/23/22 227 lb 4.8 oz (103.1 kg)    No results found.  Physical Exam Vitals and nursing note reviewed.  Constitutional:      General: She is not in acute distress.    Appearance: Normal appearance. She is obese. She is not ill-appearing, toxic-appearing or diaphoretic.  HENT:     Head: Normocephalic and atraumatic.     Right Ear: Tympanic membrane, ear canal and external ear normal. There is no impacted cerumen.     Left Ear: Tympanic membrane, ear canal and external ear normal. There is no impacted cerumen.     Nose: Nose normal. No congestion or rhinorrhea.     Mouth/Throat:     Mouth: Mucous membranes are moist.     Pharynx: Oropharynx is clear. No oropharyngeal exudate or posterior oropharyngeal erythema.  Eyes:     General: No scleral icterus.       Right eye: No discharge.        Left eye: No discharge.     Extraocular Movements: Extraocular movements intact.     Conjunctiva/sclera: Conjunctivae normal.     Pupils: Pupils are equal, round, and reactive to light.  Neck:     Vascular: No carotid bruit.  Cardiovascular:     Rate and Rhythm: Normal rate and regular rhythm.     Pulses: Normal pulses.     Heart sounds: No murmur heard.    No friction rub. No gallop.  Pulmonary:     Effort: Pulmonary effort is normal. No respiratory distress.     Breath sounds: Normal breath sounds. No stridor. No wheezing, rhonchi or rales.  Chest:     Chest wall: No tenderness.  Abdominal:     General: Abdomen is flat. Bowel sounds are normal. There is no distension.     Palpations: Abdomen is soft. There is no mass.     Tenderness: There is no abdominal tenderness. There is  no right CVA tenderness, left CVA tenderness, guarding or rebound.     Hernia: No hernia is present.  Genitourinary:    Comments: Breast and pelvic exams deferred with shared decision making Musculoskeletal:        General: No swelling, tenderness, deformity or signs of injury.     Cervical back: Normal range of motion and neck supple. No rigidity. No muscular tenderness.     Right lower leg: No edema.     Left lower leg: No edema.  Lymphadenopathy:     Cervical: No cervical adenopathy.  Skin:    General: Skin is warm and dry.     Capillary Refill: Capillary refill takes less than 2 seconds.     Coloration: Skin is not jaundiced or pale.     Findings: No bruising, erythema, lesion or rash.  Neurological:     General: No focal deficit present.     Mental Status: She is alert and oriented to person, place, and time. Mental status is at baseline.     Cranial Nerves: No cranial nerve deficit.     Sensory: No sensory  deficit.     Motor: No weakness.     Coordination: Coordination normal.     Gait: Gait normal.     Deep Tendon Reflexes: Reflexes normal.  Psychiatric:        Mood and Affect: Mood normal.        Behavior: Behavior normal.        Thought Content: Thought content normal.        Judgment: Judgment normal.         No data to display          Results for orders placed or performed in visit on 05/23/22  Veritor Flu A/B Waived  Result Value Ref Range   Influenza A Negative Negative   Influenza B Negative Negative      Assessment & Plan:   Problem List Items Addressed This Visit       Digestive   Esophageal reflux   Relevant Orders   CBC with Differential/Platelet   Comprehensive metabolic panel     Other   Smoker   Relevant Orders   Comprehensive metabolic panel   Urinalysis, Routine w reflex microscopic   Depression, major, single episode, severe (HCC)   Relevant Orders   Comprehensive metabolic panel   TSH   Other Visit Diagnoses     Welcome  to Medicare preventive visit    -  Primary   Relevant Orders   EKG 12-Lead (Completed)   Bayer DCA Hb A1c Waived   Screening for cardiovascular condition       Relevant Orders   Lipid Panel w/o Chol/HDL Ratio   EKG 12-Lead (Completed)   Screening for diabetes mellitus       Relevant Orders   Bayer DCA Hb A1c Waived   Acute sinusitis, recurrence not specified, unspecified location       Other chest pain            Preventative Services:  Health Risk Assessment and Personalized Prevention Plan: Done today Bone Mass Measurements: N/A Breast Cancer Screening: up to date CVD Screening: done today Cervical Cancer Screening: up to date Colon Cancer Screening: N/A Depression Screening: Done today Diabetes Screening: Done today Glaucoma Screening: See your eye doctor Hepatitis B vaccine: N/A Hepatitis C screening: Up to date HIV Screening: Up to date Flu Vaccine: Declined Lung cancer Screening: N/A Obesity Screening: Done today Pneumonia Vaccines (2): N/A STI Screening: N/A  Follow up plan: No follow-ups on file.   LABORATORY TESTING:  - Pap smear: up to date  IMMUNIZATIONS:   - Tdap: Tetanus vaccination status reviewed: last tetanus booster within 10 years. - Influenza: Refused - Pneumovax: Not applicable - Prevnar: Not applicable - Zostavax vaccine: Not applicable  SCREENING: -Mammogram: Up to date  - Colonoscopy: Not applicable   PATIENT COUNSELING:   Advised to take 1 mg of folate supplement per day if capable of pregnancy.   Sexuality: Discussed sexually transmitted diseases, partner selection, use of condoms, avoidance of unintended pregnancy  and contraceptive alternatives.   Advised to avoid cigarette smoking.  I discussed with the patient that most people either abstain from alcohol or drink within safe limits (<=14/week and <=4 drinks/occasion for males, <=7/weeks and <= 3 drinks/occasion for females) and that the risk for alcohol disorders and other  health effects rises proportionally with the number of drinks per week and how often a drinker exceeds daily limits.  Discussed cessation/primary prevention of drug use and availability of treatment for abuse.   Diet: Encouraged to adjust caloric  intake to maintain  or achieve ideal body weight, to reduce intake of dietary saturated fat and total fat, to limit sodium intake by avoiding high sodium foods and not adding table salt, and to maintain adequate dietary potassium and calcium preferably from fresh fruits, vegetables, and low-fat dairy products.    stressed the importance of regular exercise  Injury prevention: Discussed safety belts, safety helmets, smoke detector, smoking near bedding or upholstery.   Dental health: Discussed importance of regular tooth brushing, flossing, and dental visits.    NEXT PREVENTATIVE PHYSICAL DUE IN 1 YEAR. No follow-ups on file.

## 2022-11-25 NOTE — Progress Notes (Signed)
Interpreted my me today. NSR at 78bpm. Poor baseline. No ST segment changes.

## 2022-11-25 NOTE — Patient Instructions (Signed)
Preventative Services:  Health Risk Assessment and Personalized Prevention Plan: Done today Bone Mass Measurements: N/A Breast Cancer Screening: up to date CVD Screening: done today Cervical Cancer Screening: up to date Colon Cancer Screening: N/A Depression Screening: Done today Diabetes Screening: Done today Glaucoma Screening: See your eye doctor Hepatitis B vaccine: N/A Hepatitis C screening: Up to date HIV Screening: Up to date Flu Vaccine: Declined Lung cancer Screening: N/A Obesity Screening: Done today Pneumonia Vaccines (2): N/A STI Screening: N/A

## 2022-11-26 ENCOUNTER — Encounter: Payer: Self-pay | Admitting: Family Medicine

## 2022-11-26 DIAGNOSIS — Z7189 Other specified counseling: Secondary | ICD-10-CM | POA: Insufficient documentation

## 2022-11-26 LAB — COMPREHENSIVE METABOLIC PANEL
ALT: 30 IU/L (ref 0–32)
AST: 17 IU/L (ref 0–40)
Albumin: 4.4 g/dL (ref 3.9–4.9)
Alkaline Phosphatase: 121 IU/L (ref 44–121)
BUN/Creatinine Ratio: 18 (ref 9–23)
BUN: 12 mg/dL (ref 6–24)
Bilirubin Total: <0.2 mg/dL (ref 0.0–1.2)
CO2: 22 mmol/L (ref 20–29)
Calcium: 9.3 mg/dL (ref 8.7–10.2)
Chloride: 98 mmol/L (ref 96–106)
Creatinine, Ser: 0.65 mg/dL (ref 0.57–1.00)
Globulin, Total: 2.4 g/dL (ref 1.5–4.5)
Glucose: 104 mg/dL — ABNORMAL HIGH (ref 70–99)
Potassium: 4.9 mmol/L (ref 3.5–5.2)
Sodium: 138 mmol/L (ref 134–144)
Total Protein: 6.8 g/dL (ref 6.0–8.5)
eGFR: 112 mL/min/{1.73_m2} (ref >59)

## 2022-11-26 LAB — CBC WITH DIFFERENTIAL/PLATELET
Basophils Absolute: 0 10*3/uL (ref 0.0–0.2)
Basos: 0 %
EOS (ABSOLUTE): 0 10*3/uL (ref 0.0–0.4)
Eos: 0 %
Hematocrit: 39.8 % (ref 34.0–46.6)
Hemoglobin: 13.6 g/dL (ref 11.1–15.9)
Immature Grans (Abs): 0.2 10*3/uL — ABNORMAL HIGH (ref 0.0–0.1)
Immature Granulocytes: 2 %
Lymphocytes Absolute: 1.2 10*3/uL (ref 0.7–3.1)
Lymphs: 11 %
MCH: 30 pg (ref 26.6–33.0)
MCHC: 34.2 g/dL (ref 31.5–35.7)
MCV: 88 fL (ref 79–97)
Monocytes Absolute: 0.3 10*3/uL (ref 0.1–0.9)
Monocytes: 3 %
Neutrophils Absolute: 9.2 10*3/uL — ABNORMAL HIGH (ref 1.4–7.0)
Neutrophils: 84 %
Platelets: 308 10*3/uL (ref 150–450)
RBC: 4.54 x10E6/uL (ref 3.77–5.28)
RDW: 12.7 % (ref 11.7–15.4)
WBC: 10.9 10*3/uL — ABNORMAL HIGH (ref 3.4–10.8)

## 2022-11-26 LAB — LIPID PANEL W/O CHOL/HDL RATIO
Cholesterol, Total: 244 mg/dL — ABNORMAL HIGH (ref 100–199)
HDL: 82 mg/dL (ref >39)
LDL Chol Calc (NIH): 111 mg/dL — ABNORMAL HIGH (ref 0–99)
Triglycerides: 302 mg/dL — ABNORMAL HIGH (ref 0–149)
VLDL Cholesterol Cal: 51 mg/dL — ABNORMAL HIGH (ref 5–40)

## 2022-11-26 LAB — TSH: TSH: 1.06 u[IU]/mL (ref 0.450–4.500)

## 2022-11-26 NOTE — Assessment & Plan Note (Signed)
Under good control on current regimen. Continue current regimen. Continue to monitor. Call with any concerns. Refills given.   

## 2022-11-26 NOTE — Assessment & Plan Note (Signed)
Continue to follow with psychiatry. Call with any concerns. Continue to monitor.  

## 2022-11-26 NOTE — Assessment & Plan Note (Signed)
A voluntary discussion about advance care planning including the explanation and discussion of advance directives was extensively discussed  with the patient for 5 minutes with patient and husband present.  Explanation about the health care proxy and Living will was reviewed and packet with forms with explanation of how to fill them out was given.  During this discussion, the patient was not able to identify a health care proxy and plans to fill out the paperwork required.  Patient was offered a separate Advance Care Planning visit for further assistance with forms.

## 2022-11-26 NOTE — Assessment & Plan Note (Signed)
Encouraged diet and exercise with goal of losing 1-2lbs per week. Call with any concerns. Continue to monitor.  

## 2022-11-26 NOTE — Assessment & Plan Note (Signed)
Working on quitting

## 2022-11-26 NOTE — Assessment & Plan Note (Signed)
Under good control on current regimen. Continue current regimen. Continue to monitor. Call with any concerns. Refills given. Labs drawn today.   

## 2022-12-30 ENCOUNTER — Ambulatory Visit (INDEPENDENT_AMBULATORY_CARE_PROVIDER_SITE_OTHER): Payer: Medicare Other | Admitting: Family Medicine

## 2022-12-30 ENCOUNTER — Encounter: Payer: Self-pay | Admitting: Family Medicine

## 2022-12-30 VITALS — BP 135/89 | HR 90 | Temp 98.4°F | Wt 231.6 lb

## 2022-12-30 DIAGNOSIS — M5432 Sciatica, left side: Secondary | ICD-10-CM

## 2022-12-30 MED ORDER — TRIAMCINOLONE ACETONIDE 40 MG/ML IJ SUSP
40.0000 mg | Freq: Once | INTRAMUSCULAR | Status: AC
Start: 2022-12-30 — End: 2022-12-30
  Administered 2022-12-30: 40 mg via INTRAMUSCULAR

## 2022-12-30 MED ORDER — PREDNISONE 10 MG PO TABS: ORAL_TABLET | ORAL | 0 refills | Status: DC

## 2022-12-30 MED ORDER — BACLOFEN 10 MG PO TABS: 10.0000 mg | ORAL_TABLET | Freq: Every evening | ORAL | 0 refills | Status: DC | PRN

## 2022-12-30 NOTE — Progress Notes (Signed)
BP 135/89   Pulse 90   Temp 98.4 F (36.9 C) (Oral)   Wt 231 lb 9.6 oz (105.1 kg)   SpO2 96%   BMI 41.03 kg/m    Subjective:    Patient ID: Sydney Valenzuela, female    DOB: 1980-07-16, 42 y.o.   MRN: [id number]  HPI: Sydney Valenzuela is a 42 y.o. female  Chief Complaint  Patient presents with   Sciatica   Back Pain   BACK PAIN Duration: 2-3 months ago Mechanism of injury:  doing yoga Location: Left and low back Onset: sudden Severity: severe Quality: aching and shooting Frequency: constant, occasional shooting Radiation: L leg above the knee Aggravating factors: moving Alleviating factors: brace Status: worse Treatments attempted: brace, regular medicine, back and body, stretching  Relief with NSAIDs?: no Nighttime pain:  no Paresthesias / decreased sensation:  yes Bowel / bladder incontinence:  no Fevers:  no Dysuria / urinary frequency:  no  Relevant past medical, surgical, family and social history reviewed and updated as indicated. Interim medical history since our last visit reviewed. Allergies and medications reviewed and updated.  Review of Systems  Constitutional: Negative.   Cardiovascular: Negative.   Gastrointestinal: Negative.   Musculoskeletal:  Positive for back pain and myalgias. Negative for arthralgias, gait problem, joint swelling, neck pain and neck stiffness.  Skin: Negative.   Neurological: Negative.   Psychiatric/Behavioral: Negative.      Per HPI unless specifically indicated above     Objective:    BP 135/89   Pulse 90   Temp 98.4 F (36.9 C) (Oral)   Wt 231 lb 9.6 oz (105.1 kg)   SpO2 96%   BMI 41.03 kg/m   Wt Readings from Last 3 Encounters:  12/30/22 231 lb 9.6 oz (105.1 kg)  11/25/22 236 lb 9.6 oz (107.3 kg)  08/16/22 235 lb 12.8 oz (107 kg)    Physical Exam Vitals and nursing note reviewed.  Constitutional:      General: She is not in acute distress.    Appearance: Normal appearance. She is obese. She is not  ill-appearing, toxic-appearing or diaphoretic.  HENT:     Head: Normocephalic and atraumatic.     Right Ear: External ear normal.     Left Ear: External ear normal.     Nose: Nose normal.     Mouth/Throat:     Mouth: Mucous membranes are moist.     Pharynx: Oropharynx is clear.  Eyes:     General: No scleral icterus.       Right eye: No discharge.        Left eye: No discharge.     Extraocular Movements: Extraocular movements intact.     Conjunctiva/sclera: Conjunctivae normal.     Pupils: Pupils are equal, round, and reactive to light.  Cardiovascular:     Rate and Rhythm: Normal rate and regular rhythm.     Pulses: Normal pulses.     Heart sounds: Normal heart sounds. No murmur heard.    No friction rub. No gallop.  Pulmonary:     Effort: Pulmonary effort is normal. No respiratory distress.     Breath sounds: Normal breath sounds. No stridor. No wheezing, rhonchi or rales.  Chest:     Chest wall: No tenderness.  Musculoskeletal:        General: Tenderness present. No swelling, deformity or signs of injury.     Cervical back: Normal range of motion and neck supple.     Right  lower leg: No edema.     Left lower leg: No edema.  Skin:    General: Skin is warm and dry.     Capillary Refill: Capillary refill takes less than 2 seconds.     Coloration: Skin is not jaundiced or pale.     Findings: No bruising, erythema, lesion or rash.  Neurological:     General: No focal deficit present.     Mental Status: She is alert and oriented to person, place, and time. Mental status is at baseline.  Psychiatric:        Mood and Affect: Mood normal.        Behavior: Behavior normal.        Thought Content: Thought content normal.        Judgment: Judgment normal.     Results for orders placed or performed in visit on 11/25/22  CBC with Differential/Platelet  Result Value Ref Range   WBC 10.9 (H) 3.4 - 10.8 x10E3/uL   RBC 4.54 3.77 - 5.28 x10E6/uL   Hemoglobin 13.6 11.1 - 15.9 g/dL    Hematocrit 16.1 09.6 - 46.6 %   MCV 88 79 - 97 fL   MCH 30.0 26.6 - 33.0 pg   MCHC 34.2 31.5 - 35.7 g/dL   RDW 04.5 40.9 - 81.1 %   Platelets 308 150 - 450 x10E3/uL   Neutrophils 84 Not Estab. %   Lymphs 11 Not Estab. %   Monocytes 3 Not Estab. %   Eos 0 Not Estab. %   Basos 0 Not Estab. %   Neutrophils Absolute 9.2 (H) 1.4 - 7.0 x10E3/uL   Lymphocytes Absolute 1.2 0.7 - 3.1 x10E3/uL   Monocytes Absolute 0.3 0.1 - 0.9 x10E3/uL   EOS (ABSOLUTE) 0.0 0.0 - 0.4 x10E3/uL   Basophils Absolute 0.0 0.0 - 0.2 x10E3/uL   Immature Granulocytes 2 Not Estab. %   Immature Grans (Abs) 0.2 (H) 0.0 - 0.1 x10E3/uL  Comprehensive metabolic panel  Result Value Ref Range   Glucose 104 (H) 70 - 99 mg/dL   BUN 12 6 - 24 mg/dL   Creatinine, Ser 9.14 0.57 - 1.00 mg/dL   eGFR 782 >95 AO/ZHY/8.65   BUN/Creatinine Ratio 18 9 - 23   Sodium 138 134 - 144 mmol/L   Potassium 4.9 3.5 - 5.2 mmol/L   Chloride 98 96 - 106 mmol/L   CO2 22 20 - 29 mmol/L   Calcium 9.3 8.7 - 10.2 mg/dL   Total Protein 6.8 6.0 - 8.5 g/dL   Albumin 4.4 3.9 - 4.9 g/dL   Globulin, Total 2.4 1.5 - 4.5 g/dL   Bilirubin Total <7.8 0.0 - 1.2 mg/dL   Alkaline Phosphatase 121 44 - 121 IU/L   AST 17 0 - 40 IU/L   ALT 30 0 - 32 IU/L  Lipid Panel w/o Chol/HDL Ratio  Result Value Ref Range   Cholesterol, Total 244 (H) 100 - 199 mg/dL   Triglycerides 469 (H) 0 - 149 mg/dL   HDL 82 >62 mg/dL   VLDL Cholesterol Cal 51 (H) 5 - 40 mg/dL   LDL Chol Calc (NIH) 952 (H) 0 - 99 mg/dL  Urinalysis, Routine w reflex microscopic  Result Value Ref Range   Specific Gravity, UA 1.020 1.005 - 1.030   pH, UA 8.0 (H) 5.0 - 7.5   Color, UA Yellow Yellow   Appearance Ur Clear Clear   Leukocytes,UA Negative Negative   Protein,UA Negative Negative/Trace   Glucose, UA Negative Negative  Ketones, UA Negative Negative   RBC, UA Negative Negative   Bilirubin, UA Negative Negative   Urobilinogen, Ur 0.2 0.2 - 1.0 mg/dL   Nitrite, UA Negative Negative    Microscopic Examination Comment   TSH  Result Value Ref Range   TSH 1.060 0.450 - 4.500 uIU/mL  Bayer DCA Hb A1c Waived  Result Value Ref Range   HB A1C (BAYER DCA - WAIVED) 5.7 (H) 4.8 - 5.6 %      Assessment & Plan:   Problem List Items Addressed This Visit   None Visit Diagnoses     Left sided sciatica    -  Primary   Will treat with steroids, baclofen and stretches. Call if not getting better or getting worse. Recheck about 3 weeks. Call with any concerns.   Relevant Medications   triamcinolone acetonide (KENALOG-40) injection 40 mg (Start on 12/30/2022  4:30 PM)   baclofen (LIORESAL) 10 MG tablet   Other Relevant Orders   DG Lumbar Spine Complete        Follow up plan: Return in about 3 weeks (around 01/20/2023) for as able virtual appt with Dr. Evelene Croon to discuss mirena insertion.

## 2022-12-30 NOTE — Patient Instructions (Signed)
[facility] pain management [phone]

## 2023-01-20 ENCOUNTER — Telehealth (INDEPENDENT_AMBULATORY_CARE_PROVIDER_SITE_OTHER): Payer: Medicare Other | Admitting: Family Medicine

## 2023-01-20 ENCOUNTER — Encounter: Payer: Self-pay | Admitting: Family Medicine

## 2023-01-20 DIAGNOSIS — M5432 Sciatica, left side: Secondary | ICD-10-CM | POA: Diagnosis not present

## 2023-01-20 MED ORDER — BACLOFEN 10 MG PO TABS: 10.0000 mg | ORAL_TABLET | Freq: Every evening | ORAL | 1 refills | Status: DC | PRN

## 2023-01-20 NOTE — Progress Notes (Signed)
There were no vitals taken for this visit.   Subjective:    Patient ID: Sydney Valenzuela, female    DOB: 1980/05/12, 42 y.o.   MRN: [id number]  HPI: AMIYRAH Valenzuela is a 42 y.o. female  Chief Complaint  Patient presents with   Back Pain   BACK PAIN Duration:  3-4 months ago Mechanism of injury: doing yoga Location: L low back Onset: sudden Severity: moderate Quality: sharp, stabbing, contraction like Frequency: intermittent Radiation: L leg above the knee Aggravating factors: movement Alleviating factors: laying Status: better Treatments attempted: rest, ice, heat, APAP, ibuprofen, aleve, and HEP  Relief with NSAIDs?: mild Nighttime pain:  no Paresthesias / decreased sensation:  yes Bowel / bladder incontinence:  no Fevers:  no Dysuria / urinary frequency:  no  Relevant past medical, surgical, family and social history reviewed and updated as indicated. Interim medical history since our last visit reviewed. Allergies and medications reviewed and updated.  Review of Systems  Constitutional: Negative.   Respiratory: Negative.    Cardiovascular: Negative.   Gastrointestinal: Negative.   Musculoskeletal:  Positive for back pain and myalgias. Negative for arthralgias, gait problem, joint swelling, neck pain and neck stiffness.  Skin: Negative.   Neurological: Negative.   Psychiatric/Behavioral:  Positive for dysphoric mood. Negative for agitation, behavioral problems, confusion, decreased concentration, hallucinations, self-injury, sleep disturbance and suicidal ideas. The patient is nervous/anxious. The patient is not hyperactive.     Per HPI unless specifically indicated above     Objective:    There were no vitals taken for this visit.  Wt Readings from Last 3 Encounters:  12/30/22 231 lb 9.6 oz (105.1 kg)  11/25/22 236 lb 9.6 oz (107.3 kg)  08/16/22 235 lb 12.8 oz (107 kg)    Physical Exam Vitals and nursing note reviewed.  Constitutional:      General: She  is not in acute distress.    Appearance: Normal appearance. She is not ill-appearing, toxic-appearing or diaphoretic.  HENT:     Head: Normocephalic and atraumatic.     Right Ear: External ear normal.     Left Ear: External ear normal.     Nose: Nose normal.     Mouth/Throat:     Mouth: Mucous membranes are moist.     Pharynx: Oropharynx is clear.  Eyes:     General: No scleral icterus.       Right eye: No discharge.        Left eye: No discharge.     Conjunctiva/sclera: Conjunctivae normal.     Pupils: Pupils are equal, round, and reactive to light.  Pulmonary:     Effort: Pulmonary effort is normal. No respiratory distress.     Comments: Speaking in full sentences Musculoskeletal:        General: Normal range of motion.     Cervical back: Normal range of motion.  Skin:    Coloration: Skin is not jaundiced or pale.     Findings: No bruising, erythema, lesion or rash.  Neurological:     Mental Status: She is alert and oriented to person, place, and time. Mental status is at baseline.  Psychiatric:        Mood and Affect: Mood normal.        Behavior: Behavior normal.        Thought Content: Thought content normal.        Judgment: Judgment normal.     Results for orders placed or performed in visit on 11/25/22  CBC with Differential/Platelet  Result Value Ref Range   WBC 10.9 (H) 3.4 - 10.8 x10E3/uL   RBC 4.54 3.77 - 5.28 x10E6/uL   Hemoglobin 13.6 11.1 - 15.9 g/dL   Hematocrit 60.4 54.0 - 46.6 %   MCV 88 79 - 97 fL   MCH 30.0 26.6 - 33.0 pg   MCHC 34.2 31.5 - 35.7 g/dL   RDW 98.1 19.1 - 47.8 %   Platelets 308 150 - 450 x10E3/uL   Neutrophils 84 Not Estab. %   Lymphs 11 Not Estab. %   Monocytes 3 Not Estab. %   Eos 0 Not Estab. %   Basos 0 Not Estab. %   Neutrophils Absolute 9.2 (H) 1.4 - 7.0 x10E3/uL   Lymphocytes Absolute 1.2 0.7 - 3.1 x10E3/uL   Monocytes Absolute 0.3 0.1 - 0.9 x10E3/uL   EOS (ABSOLUTE) 0.0 0.0 - 0.4 x10E3/uL   Basophils Absolute 0.0 0.0 -  0.2 x10E3/uL   Immature Granulocytes 2 Not Estab. %   Immature Grans (Abs) 0.2 (H) 0.0 - 0.1 x10E3/uL  Comprehensive metabolic panel  Result Value Ref Range   Glucose 104 (H) 70 - 99 mg/dL   BUN 12 6 - 24 mg/dL   Creatinine, Ser 2.95 0.57 - 1.00 mg/dL   eGFR 621 >30 QM/VHQ/4.69   BUN/Creatinine Ratio 18 9 - 23   Sodium 138 134 - 144 mmol/L   Potassium 4.9 3.5 - 5.2 mmol/L   Chloride 98 96 - 106 mmol/L   CO2 22 20 - 29 mmol/L   Calcium 9.3 8.7 - 10.2 mg/dL   Total Protein 6.8 6.0 - 8.5 g/dL   Albumin 4.4 3.9 - 4.9 g/dL   Globulin, Total 2.4 1.5 - 4.5 g/dL   Bilirubin Total <6.2 0.0 - 1.2 mg/dL   Alkaline Phosphatase 121 44 - 121 IU/L   AST 17 0 - 40 IU/L   ALT 30 0 - 32 IU/L  Lipid Panel w/o Chol/HDL Ratio  Result Value Ref Range   Cholesterol, Total 244 (H) 100 - 199 mg/dL   Triglycerides 952 (H) 0 - 149 mg/dL   HDL 82 >84 mg/dL   VLDL Cholesterol Cal 51 (H) 5 - 40 mg/dL   LDL Chol Calc (NIH) 132 (H) 0 - 99 mg/dL  Urinalysis, Routine w reflex microscopic  Result Value Ref Range   Specific Gravity, UA 1.020 1.005 - 1.030   pH, UA 8.0 (H) 5.0 - 7.5   Color, UA Yellow Yellow   Appearance Ur Clear Clear   Leukocytes,UA Negative Negative   Protein,UA Negative Negative/Trace   Glucose, UA Negative Negative   Ketones, UA Negative Negative   RBC, UA Negative Negative   Bilirubin, UA Negative Negative   Urobilinogen, Ur 0.2 0.2 - 1.0 mg/dL   Nitrite, UA Negative Negative   Microscopic Examination Comment   TSH  Result Value Ref Range   TSH 1.060 0.450 - 4.500 uIU/mL  Bayer DCA Hb A1c Waived  Result Value Ref Range   HB A1C (BAYER DCA - WAIVED) 5.7 (H) 4.8 - 5.6 %      Assessment & Plan:   Problem List Items Addressed This Visit   None Visit Diagnoses     Left sided sciatica    -  Primary   Will go get her x-ray. Will start PT. Call with any concerns. Follow up 1 month as scheduled.   Relevant Medications   baclofen (LIORESAL) 10 MG tablet   Other Relevant Orders    Ambulatory  referral to Physical Therapy        Follow up plan: Return in about 4 weeks (around 02/17/2023).    This visit was completed via video visit through MyChart due to the restrictions of the COVID-19 pandemic. All issues as above were discussed and addressed. Physical exam was done as above through visual confirmation on video through MyChart. If it was felt that the patient should be evaluated in the office, they were directed there. The patient verbally consented to this visit. Location of the patient: home Location of the provider: work Those involved with this call:  Provider: Olevia Perches, DO CMA: Malen Gauze, CMA Front Desk/Registration:  Servando Snare   Time spent on call:  15 minutes with patient face to face via video conference. More than 50% of this time was spent in counseling and coordination of care. 23 minutes total spent in review of patient's record and preparation of their chart.

## 2023-01-20 NOTE — Progress Notes (Signed)
Appointment has been made.

## 2023-01-22 ENCOUNTER — Telehealth: Payer: Medicare Other | Admitting: Pediatrics

## 2023-01-22 ENCOUNTER — Encounter: Payer: Self-pay | Admitting: Pediatrics

## 2023-01-22 DIAGNOSIS — M797 Fibromyalgia: Secondary | ICD-10-CM | POA: Diagnosis not present

## 2023-01-22 DIAGNOSIS — T8389XA Other specified complication of genitourinary prosthetic devices, implants and grafts, initial encounter: Secondary | ICD-10-CM

## 2023-01-22 DIAGNOSIS — Z3009 Encounter for other general counseling and advice on contraception: Secondary | ICD-10-CM

## 2023-01-22 DIAGNOSIS — G9332 Myalgic encephalomyelitis/chronic fatigue syndrome: Secondary | ICD-10-CM | POA: Diagnosis not present

## 2023-01-22 DIAGNOSIS — N920 Excessive and frequent menstruation with regular cycle: Secondary | ICD-10-CM

## 2023-01-22 NOTE — Patient Instructions (Addendum)
Quick summary:  BEFORE your procedure: - Please take anti-anxiety medication (for you: xanax, already prescribed) 20-30 minutes before. Bring an extra one just in case  AT THE VISIT: - We will plan on giving you a toradol shot as soon as you arrive - We will give you a tampon to place with numbing medication for at least 10 minutes   Feel free to bring 1 support person the day of your procedure  If you have any questions, please do not hesitate to mychart message me ahead of the procedure.  I will see you on 10/29 at 340pm.   DETAILED VISIT SUMMARY:  During today's visit, we discussed your dissatisfaction with your current intrauterine device (IUD) and your experiences with different IUDs. We also reviewed your symptoms related to menopause and your chronic conditions, including fibromyalgia and chronic fatigue syndrome. We have made a plan to address your concerns and improve your comfort.  YOUR PLAN:  -MENORRHAGIA AND DYSMENORRHEA: Menorrhagia refers to heavy menstrual bleeding, and dysmenorrhea refers to painful periods. These symptoms may be worsened by your current Liletta IUD. Given your positive experience with the Mirena IUD in the past, we will remove the Liletta IUD and insert a Mirena IUD on 01/28/2023. To help with the procedure, we will use a numbing cream-soaked tampon and administer a Toradol injection for pain control. You are also advised to take Xanax for anxiety control before the procedure.  -CHRONIC FATIGUE SYNDROME AND FIBROMYALGIA: Chronic fatigue syndrome causes extreme tiredness, and fibromyalgia causes widespread pain. These conditions may worsen your fatigue during menstruation. We will continue with your current management plan for these conditions.  -GENERAL HEALTH MAINTENANCE: Continue with your current hygiene practices to manage vaginal odor and monitor for any changes in your symptoms, especially those related to menopause.  INSTRUCTIONS:  Please return  on 01/28/2023 for the removal of your current Liletta IUD and the insertion of the Mirena IUD. Make sure to take Xanax as advised before the procedure. If you notice any significant changes in your symptoms, please contact our office.

## 2023-01-22 NOTE — Progress Notes (Signed)
Appointment has been made.

## 2023-01-22 NOTE — Progress Notes (Signed)
Telehealth Visit  I connected with  Sydney Valenzuela on 01/22/23 by a video enabled telemedicine application and verified that I am speaking with the correct person using two identifiers.   I discussed the limitations of evaluation and management by telemedicine. The patient expressed understanding and agreed to proceed.  Subjective:    Patient ID: Sydney Valenzuela, female    DOB: [birth year], 42 y.o.   MRN: [id number]  HPI: Sydney Valenzuela is a 42 y.o. female  Chief Complaint  Patient presents with   Contraception    Patient says she currently IUD and says it is not the Mirena, she says the hormones are starting to wear and she is moody than normal, cramping and etc. Patient says she would like to discuss switching back to the Mirena.    Discussed the use of AI scribe software for clinical note transcription with the patient, who gave verbal consent to proceed.  History of Present Illness   The patient, with a history of fibromyalgia and chronic fatigue syndrome, presents with dissatisfaction with her current intrauterine device Penni Bombard), which she has had for about five years. She reports constant spotting and premenstrual syndrome symptoms, which she did not experience with her previous IUD, Mirena. She also had a negative experience with the ParaGard IUD, which exacerbated her already heavy menstrual bleeding. The patient is in the early stages of menopause, experiencing frequent hot flashes and hormonal changes. She also reports sporadic periods and spotting after intercourse. The patient is dealing with significant life stress, including the recent murder of her son, and is seeking to minimize her physical discomfort. She requests to have the Mirena IUD inserted as soon as possible, as she found it to be the most effective in managing her symptoms in the past.      Relevant past medical, surgical, family and social history reviewed and updated as indicated. Interim medical history since  our last visit reviewed. Allergies and medications reviewed and updated.  ROS per HPI unless specifically indicated above     Objective:    There were no vitals taken for this visit.  Wt Readings from Last 3 Encounters:  12/30/22 231 lb 9.6 oz (105.1 kg)  11/25/22 236 lb 9.6 oz (107.3 kg)  08/16/22 235 lb 12.8 oz (107 kg)     Physical Exam Constitutional:      General: She is not in acute distress.    Appearance: Normal appearance.  Neurological:     General: No focal deficit present.     Mental Status: She is alert. Mental status is at baseline.     LIMITED EXAM GIVEN VIDEO VISIT     Assessment & Plan:  Assessment & Plan   Menorrhagia due to intrauterine device (IUD) (HCC) Encounter for counseling regarding contraception Heavy and painful periods with clots, possibly exacerbated by current Liletta IUD. History of similar symptoms with ParaGard IUD. Previous positive experience with Mirena IUD. Possible perimenopause with hot flashes and irregular periods. No contraindications noted on chart review today. -Remove current Liletta IUD. -Insert Mirena IUD on 01/28/2023. -For pain: EMLA soaked tampon and toradol injection before procedure -Advise patient to take Xanax for anxiety control prior to procedure.  Fibromyalgia CFS (chronic fatigue syndrome) Chronic conditions possibly exacerbating fatigue during menstruation. -Continue current management.      Follow up plan: Return in about 6 days (around 01/28/2023) for IUD removal and replacement.  Anyeli Hockenbury Howell Pringle, MD  This visit was completed via video visit through MyChart  due to the restrictions of the COVID-19 pandemic. All issues as above were discussed and addressed. Physical exam was done as above through visual confirmation on video through MyChart. If it was felt that the patient should be evaluated in the office, they were directed there. The patient verbally consented to this visit."} Location of the patient:  home Location of the provider: work Those involved with this call:  Provider: Modena Nunnery, MD CMA: Malen Gauze, CMA Time spent on call:  15 minutes with patient face to face via video conference. More than 50% of this time was spent in counseling and coordination of care. 15 minutes total spent in review of patient's record and preparation of their chart.

## 2023-01-23 ENCOUNTER — Encounter: Payer: Self-pay | Admitting: Family Medicine

## 2023-01-24 ENCOUNTER — Other Ambulatory Visit: Payer: Self-pay | Admitting: Family Medicine

## 2023-01-24 MED ORDER — PREDNISONE 10 MG PO TABS: ORAL_TABLET | ORAL | 0 refills | Status: DC

## 2023-01-28 ENCOUNTER — Ambulatory Visit: Payer: Medicare Other | Admitting: Pediatrics

## 2023-01-28 DIAGNOSIS — Z30433 Encounter for removal and reinsertion of intrauterine contraceptive device: Secondary | ICD-10-CM | POA: Diagnosis not present

## 2023-01-28 DIAGNOSIS — Z30430 Encounter for insertion of intrauterine contraceptive device: Secondary | ICD-10-CM

## 2023-01-28 DIAGNOSIS — N920 Excessive and frequent menstruation with regular cycle: Secondary | ICD-10-CM

## 2023-01-28 DIAGNOSIS — Z3202 Encounter for pregnancy test, result negative: Secondary | ICD-10-CM

## 2023-01-28 LAB — PREGNANCY, URINE: Preg Test, Ur: NEGATIVE

## 2023-01-28 MED ORDER — LIDOCAINE-PRILOCAINE 2.5-2.5 % EX CREA
1.0000 | TOPICAL_CREAM | Freq: Once | CUTANEOUS | Status: AC
Start: 2023-01-28 — End: 2023-01-28
  Administered 2023-01-28: 1 via TOPICAL

## 2023-01-28 MED ORDER — KETOROLAC TROMETHAMINE 60 MG/2ML IM SOLN
60.0000 mg | Freq: Once | INTRAMUSCULAR | Status: AC
Start: 2023-01-28 — End: 2023-01-28
  Administered 2023-01-28: 60 mg via INTRAMUSCULAR

## 2023-01-28 NOTE — Progress Notes (Unsigned)
IUD Removal Procedure Note  Sydney Valenzuela is a 42 y.o. female presenting for IUD removal.  IUD type: liletta 12/30/2018  Indication: menorrhagia   Preparation:  The risks, benefits, and alternatives to the procedure were discussed in detail and electronic informed consent was obtained.  Time out was performed at 4pm with double identifiers and MA/RN present.   Procedure Details:  Speculum was placed and the IUD strings were easily visualized. The strings were grasped with ring forceps and the IUD was removed intact. The patient tolerated the procedure well.  Complications: none.  Post-procedure: standard post procedure care instructions were given to the patient.   Patient was counseled that fertility returns quickly and pregnancy can occur even before first menstrual period after IUD removal.  Future contraceptive plans are: reinsertion as below.   IUD Insertion Procedure Note  Sydney Valenzuela is a 42 y.o. female presenting for IUD insertion.   Indication: contraception  Preparation:  Urine pregnancy test result: negative  The risks, benefits, and alternatives to the procedure were discussed in detail and electronic informed consent was obtained.  Time out was performed at 4pm with double identifiers and MA/RN present.   Procedure Details:  EMLA tampon was placed by patient prior to procedure for at least 10 minutes. Bimanual exam was performed and the position of the uterus was noted to be retroverted. Speculum was placed in vagina and adequate visualization of cervix was achieved. The cervix was cleaned with betadine x 3. A single toothed tenaculum was not needed to grasp cervix. An os finder was used as needed. An endometrial pipelle was passed into the uterine cavity and the uterus was sounded to 6 cm.  The intrauterine device was then inserted sterilely into the uterine cavity to the level of the upper uterine cavity and the introducer was removed.  The IUD strings were cut  2 cm from the external os of the cervix.  Vaginal ultrasound: no indicated.  Patient tolerated procedure well.  Complications: none.  IUD Type: Mirena  Post-procedure: standard post procedure care instructions were given to the patient. Patient aware that removal of IUD is due in 8years. Routine health maintenance exams recommended.  Patient to follow-up as needed.   Antoria Lanza Howell Pringle, MD

## 2023-01-28 NOTE — Patient Instructions (Signed)
[facility]    IUD Take-Home Sheet  [  x  ] Hormonal IUD (Mirena, Calypso) It begins working now to prevent pregnancy. It can stay inside you for 8 years. Removal date ___10/29/232_____(8 years from today)  Today you may go back to school or work after your visit. Wait 24 hours after your IUD is put in before you can use tampons, take a bath, or have vaginal sex.  You may have more cramps or heavier bleeding with your periods, or spotting between your periods. This is normal. The cramping and bleeding can last for 3-6 months with the Mirena, [place], [place], and Skyla (hormone) IUDs. After 6 months, the cramping and bleeding should get better. Many people will stop having periods after 1 or 2 years with the Mirena, [place], [place], and Skyla (hormone) IUDs. If you have the Paragard (copper) IUD, you may have more cramping and more bleeding with your periods as long as you have the IUD inside you.  Ibuprofen (also called Advil or Motrin) helps decrease the bleeding and cramping. You can buy Ibuprofen at any drug store without a prescription. You can take as many as 4 pills (800 mg) of Ibuprofen every 8 hours with food (each pill contains 200 mg). To prevent cramping, start taking Ibuprofen as soon as your period starts and keep taking it every 8 hours for the first 2-3 days of your period. You can also put a hot water bottle on your belly if you have bad cramps.   Your IUD may come out by itself in the first three months. If you can feel the strings, the IUD is in the right place. If your IUD comes out, you can become pregnant immediately. If you are not sure how to check the strings, we can help you (Our phone number is at the top of this paper.) Meanwhile, use condoms.   The IUD does NOT protect you against sexually transmitted infections. ALWAYS use protection against sexually transmitted infections (external condoms, internal condoms, dental dams) if you are at risk.  If you think you have been exposed to an STI, discuss testing with your clinician. Most infections can be treated WITHOUT removing your IUD.  WARNING SIGNS: Within the first 3 weeks of the IUD insertion: - Fever (>101F) - Chills - Strong or sharp pain in your stomach or belly At Any Time (these are very rare): - Late period (for Paragard users) - Feeling pregnant (breast tenderness, nausea, vomiting) - Positive home pregnancy test    If you develop any of the above warning signs, you can call this clinic at [phone], or see your usual clinician.

## 2023-01-30 ENCOUNTER — Encounter: Payer: Self-pay | Admitting: Pediatrics

## 2023-02-25 ENCOUNTER — Encounter: Payer: Self-pay | Admitting: Family Medicine

## 2023-02-25 ENCOUNTER — Ambulatory Visit: Payer: Medicare Other | Admitting: Family Medicine

## 2023-02-25 VITALS — BP 129/78 | HR 73 | Temp 97.6°F | Resp 14 | Wt 242.5 lb

## 2023-02-25 DIAGNOSIS — S93401D Sprain of unspecified ligament of right ankle, subsequent encounter: Secondary | ICD-10-CM

## 2023-02-25 DIAGNOSIS — M7531 Calcific tendinitis of right shoulder: Secondary | ICD-10-CM | POA: Diagnosis not present

## 2023-02-25 DIAGNOSIS — M5432 Sciatica, left side: Secondary | ICD-10-CM | POA: Diagnosis not present

## 2023-02-25 MED ORDER — TIZANIDINE HCL 4 MG PO TABS: 4.0000 mg | ORAL_TABLET | Freq: Four times a day (QID) | ORAL | 3 refills | Status: DC | PRN

## 2023-02-25 NOTE — Progress Notes (Signed)
BP 129/78 (BP Location: Left Arm, Patient Position: Sitting, Cuff Size: Large)   Pulse 73   Temp 97.6 F (36.4 C) (Oral)   Resp 14   Wt 242 lb 8 oz (110 kg)   SpO2 96%   BMI 42.96 kg/m    Subjective:    Patient ID: Sydney Valenzuela, female    DOB: [birth year], 42 y.o.   MRN: [id number]  HPI: Sydney Valenzuela is a 42 y.o. female  Chief Complaint  Patient presents with   Sydney Valenzuela again this morning, missed step and hurt right ankle.    Depression    About the same. Worst and better days   Back Pain    Some what better unless she over does something.    Shoulder Pain    Right shoulder, unable to lift arm and has to rest it to keep it up   BACK PAIN Duration: chronic Mechanism of injury: no trauma Location: bilateral and low back Onset: gradual Severity: severe Quality: aching, shooting, tender Frequency: constant Radiation: L leg below the knee Aggravating factors: lifting and movement Alleviating factors: rest, ice, heat, laying, NSAIDs, APAP, and muscle relaxer Status: stable Treatments attempted: rest, ice, heat, APAP, ibuprofen, aleve, physical therapy, and HEP  Relief with NSAIDs?: mild Nighttime pain:  yes Paresthesias / decreased sensation:  yes Bowel / bladder incontinence:  no Fevers:  no Dysuria / urinary frequency:  no  Shoulder has been hurting more. She fell this AM and twisted her R ankle again. She is able to walk on it, but it's been swollen and tender. No other concerns or complaints at this time.   Relevant past medical, surgical, family and social history reviewed and updated as indicated. Interim medical history since our last visit reviewed. Allergies and medications reviewed and updated.  Review of Systems  Constitutional: Negative.   Respiratory: Negative.    Cardiovascular: Negative.   Musculoskeletal:  Positive for arthralgias, back pain and myalgias. Negative for gait problem, joint swelling, neck pain and neck stiffness.  Skin:  Negative.   Neurological: Negative.   Psychiatric/Behavioral:  Positive for dysphoric mood. Negative for agitation, behavioral problems, confusion, decreased concentration, hallucinations, self-injury, sleep disturbance and suicidal ideas. The patient is nervous/anxious. The patient is not hyperactive.     Per HPI unless specifically indicated above     Objective:    BP 129/78 (BP Location: Left Arm, Patient Position: Sitting, Cuff Size: Large)   Pulse 73   Temp 97.6 F (36.4 C) (Oral)   Resp 14   Wt 242 lb 8 oz (110 kg)   SpO2 96%   BMI 42.96 kg/m   Wt Readings from Last 3 Encounters:  02/25/23 242 lb 8 oz (110 kg)  12/30/22 231 lb 9.6 oz (105.1 kg)  11/25/22 236 lb 9.6 oz (107.3 kg)    Physical Exam Vitals and nursing note reviewed.  Constitutional:      General: She is not in acute distress.    Appearance: Normal appearance. She is not ill-appearing, toxic-appearing or diaphoretic.  HENT:     Head: Normocephalic and atraumatic.     Right Ear: External ear normal.     Left Ear: External ear normal.     Nose: Nose normal.     Mouth/Throat:     Mouth: Mucous membranes are moist.     Pharynx: Oropharynx is clear.  Eyes:     General: No scleral icterus.       Right eye:  No discharge.        Left eye: No discharge.     Extraocular Movements: Extraocular movements intact.     Conjunctiva/sclera: Conjunctivae normal.     Pupils: Pupils are equal, round, and reactive to light.  Cardiovascular:     Rate and Rhythm: Normal rate and regular rhythm.     Pulses: Normal pulses.     Heart sounds: Normal heart sounds. No murmur heard.    No friction rub. No gallop.  Pulmonary:     Effort: Pulmonary effort is normal. No respiratory distress.     Breath sounds: Normal breath sounds. No stridor. No wheezing, rhonchi or rales.  Chest:     Chest wall: No tenderness.  Musculoskeletal:        General: Swelling and tenderness present.     Cervical back: Normal range of motion and  neck supple.  Skin:    General: Skin is warm and dry.     Capillary Refill: Capillary refill takes less than 2 seconds.     Coloration: Skin is not jaundiced or pale.     Findings: No bruising, erythema, lesion or rash.  Neurological:     General: No focal deficit present.     Mental Status: She is alert and oriented to person, place, and time. Mental status is at baseline.  Psychiatric:        Mood and Affect: Mood normal.        Behavior: Behavior normal.        Thought Content: Thought content normal.        Judgment: Judgment normal.     Results for orders placed or performed in visit on 01/28/23  Pregnancy, urine  Result Value Ref Range   Preg Test, Ur Negative Negative      Assessment & Plan:   Problem List Items Addressed This Visit   None Visit Diagnoses     Left sided sciatica    -  Primary   Will go get her x-ray and get her started with PT. Referral to ortho placed today. Call with any concerns.   Relevant Medications   tiZANidine (ZANAFLEX) 4 MG tablet   Other Relevant Orders   Ambulatory referral to Orthopedic Surgery   Ambulatory referral to Physical Therapy   Sprain of right ankle, unspecified ligament, subsequent encounter       Not wearing her brace. Will get her started with PT. Referral to ortho placed today. Call with any concerns.   Relevant Orders   Ambulatory referral to Orthopedic Surgery   Ambulatory referral to Physical Therapy   Calcific tendonitis of right shoulder       Will get her started with PT. Referral to ortho placed today. Call with any concerns.   Relevant Orders   Ambulatory referral to Physical Therapy        Follow up plan: No follow-ups on file.

## 2023-03-12 ENCOUNTER — Other Ambulatory Visit: Payer: Self-pay | Admitting: Orthopedic Surgery

## 2023-03-12 DIAGNOSIS — M25571 Pain in right ankle and joints of right foot: Secondary | ICD-10-CM

## 2023-03-12 DIAGNOSIS — M25511 Pain in right shoulder: Secondary | ICD-10-CM

## 2023-03-13 ENCOUNTER — Ambulatory Visit: Payer: Medicare Other | Admitting: Orthopedic Surgery

## 2023-03-13 ENCOUNTER — Ambulatory Visit
Admission: RE | Admit: 2023-03-13 | Discharge: 2023-03-13 | Disposition: A | Discharge status: Home / Self Care | Payer: Medicare Other | Attending: Family Medicine | Admitting: Family Medicine

## 2023-03-13 ENCOUNTER — Ambulatory Visit
Admission: RE | Admit: 2023-03-13 | Discharge: 2023-03-13 | Disposition: A | Discharge status: Home / Self Care | Payer: Medicare Other | Attending: Orthopedic Surgery | Admitting: Orthopedic Surgery

## 2023-03-13 ENCOUNTER — Ambulatory Visit
Admission: RE | Admit: 2023-03-13 | Discharge: 2023-03-13 | Disposition: A | Discharge status: Home / Self Care | Payer: Medicare Other | Source: Ambulatory Visit | Attending: Family Medicine | Admitting: Family Medicine

## 2023-03-13 DIAGNOSIS — M5432 Sciatica, left side: Secondary | ICD-10-CM

## 2023-03-13 DIAGNOSIS — M25571 Pain in right ankle and joints of right foot: Secondary | ICD-10-CM

## 2023-03-13 DIAGNOSIS — M25511 Pain in right shoulder: Secondary | ICD-10-CM | POA: Diagnosis present

## 2023-03-13 MED ORDER — PREDNISONE 10 MG (21) PO TBPK: ORAL_TABLET | ORAL | 0 refills | Status: DC

## 2023-03-13 NOTE — Progress Notes (Signed)
New Patient Visit  Assessment: Sydney Valenzuela is a 42 y.o. female with the following: 1. Pain in right ankle and joints of right foot 2. Right shoulder pain, unspecified chronicity  Plan: Sydney Valenzuela has pain in her right ankle, as well as her right shoulder.  Shoulder is more acute on chronic, as she fell approximately 6 weeks ago.  She continues to have pain, but her pain is improving.  Regarding her ankle, she notes buckling sensations, and has fallen because of her ankle several times.  Radiographs of both the shoulder and the ankle are negative for acute injury.  She may have a chronic injury in the right ankle, as there are some well-corticated bony fragments in the medial gutter.  Nonetheless, I think she will benefit from a short course of prednisone, this could potentially help with pain that she is having in multiple areas.  In addition, I have recommended physical therapy for her right ankle as well as her right shoulder.  She states that she already has a referral, but her primary doctor wanted her to be evaluated prior to starting therapy.  If she continues to struggle, we can consider an injection, or plan for an MRI.  She states understanding.  She will follow-up as needed.  Follow-up: Return if symptoms worsen or fail to improve.  Subjective:  Chief Complaint  Patient presents with   Right Shoulder - Pain   Right Ankle - Pain    History of Present Illness: Sydney Valenzuela is a 42 y.o. female who has been referred by Olevia Perches, DO for evaluation of right shoulder and ankle pain.  She states that she has chronic pain in both the shoulder and the ankle.  She has fallen several times because of her ankle.  She states it is now starting to affect her knee.  Approximate 6 weeks ago, she fell again, and this worsened the pain in her right shoulder.  She states that she had very little use of the right shoulder due to pain for a period of 2-3 weeks following the most recent fall.   She has been evaluated in [facility], and has had an injection in her right shoulder, as recent as 3 months ago.  She states the injection did not help.  NSAIDs have not been effective.  She localizes the pain in the right ankle to the anterolateral ankle, as well as the posterior ankle in line with the peroneal tendons.  She has excruciating pain in the lateral aspect of the right shoulder.  She has difficulty with overhead motion.  She does have a history of fibromyalgia.  She reports that her son was murdered approximately 6 months ago, and this is affecting her pain overall.  She sees a spine specialist for her lower back pain.   Review of Systems: No fevers or chills No numbness or tingling No chest pain No shortness of breath No bowel or bladder dysfunction No GI distress No headaches   Medical History:  Past Medical History:  Diagnosis Date   Allergy    Anxiety    Arthritis    Family history of adverse reaction to anesthesia    N/V, difficult to wake up   Hiatal hernia    Hypertension     Past Surgical History:  Procedure Laterality Date   CHOLECYSTECTOMY N/A 11/21/2016   Procedure: LAPAROSCOPIC CHOLECYSTECTOMY WITH INTRAOPERATIVE CHOLANGIOGRAM;  Surgeon: Kieth Brightly, MD;  Location: [facility] ORS;  Service: General;  Laterality: N/A;  ESOPHAGOGASTRODUODENOSCOPY (EGD) WITH PROPOFOL N/A 11/04/2016   Procedure: ESOPHAGOGASTRODUODENOSCOPY (EGD) WITH PROPOFOL;  Surgeon: Scot Jun, MD;  Location: [facility] ENDOSCOPY;  Service: Endoscopy;  Laterality: N/A;   LEEP     NASAL SINUS SURGERY     TONSILLECTOMY      Family History  Problem Relation Age of Onset   Hypertension Mother    Allergies Mother    Hyperlipidemia Mother    Anxiety disorder Daughter    Cancer Maternal Uncle        prostate   Diabetes Maternal Uncle    Heart disease Maternal Uncle    Allergies Maternal Grandmother    COPD Maternal Grandmother    Alzheimer's disease Maternal Grandmother     Diabetes Maternal Grandfather    Heart disease Maternal Grandfather    Hypertension Son    Breast cancer Neg Hx    Social History   Tobacco Use   Smoking status: Former    Current packs/day: 1.00    Types: Cigarettes   Smokeless tobacco: Never  Vaping Use   Vaping status: Every Day  Substance Use Topics   Alcohol use: Yes    Comment: occasional   Drug use: No    Allergies  Allergen Reactions   Codeine Itching    Initial dose is the worst, but becomes more tolerable with more doses, severe itching and tongue swelling    Current Meds  Medication Sig   predniSONE (STERAPRED UNI-PAK 21 TAB) 10 MG (21) TBPK tablet 10 mg DS 12 as directed    Objective: There were no vitals taken for this visit.  Physical Exam:  General: Alert and oriented. and No acute distress. Gait: Right sided antalgic gait.  Evaluation of the right shoulder demonstrates no deformity.  Tenderness to palpation over the lateral shoulder.  Active forward flexion to 100 degrees.  Active abduction to 100 degrees.  Passively, I can get her to approximately 130 degrees before becomes too painful.  She has good strength in the right upper extremity.  Evaluation of the right ankle demonstrates no deformity.  Mild swelling is appreciated over the anterior lateral aspect.  She does have some pain with inversion of the ankle.  Negative anterior drawer.  Tenderness palpation along the peroneal tendons.  Toes warm well-perfused.  No tenderness to palpation over the medial ankle.  IMAGING: I personally ordered and reviewed the following images  X-ray of the right shoulder negative for acute injury.  Glenohumeral joint is reduced.  Minimal degenerative changes.  X-rays of the right ankle are without acute injury.  Well-corticated bony fragment within the medial gutter mortise is congruent.  No syndesmotic disruption.   New Medications:  Meds ordered this encounter  Medications   predniSONE (STERAPRED UNI-PAK 21 TAB)  10 MG (21) TBPK tablet    Sig: 10 mg DS 12 as directed    Dispense:  48 tablet    Refill:  0      Oliver Barre, MD  03/13/2023 12:03 PM

## 2023-03-23 ENCOUNTER — Encounter: Payer: Self-pay | Admitting: Orthopedic Surgery

## 2023-03-24 ENCOUNTER — Encounter: Payer: Self-pay | Admitting: Family Medicine

## 2023-03-28 ENCOUNTER — Other Ambulatory Visit: Payer: Self-pay | Admitting: Family Medicine

## 2023-04-07 ENCOUNTER — Encounter: Payer: Self-pay | Admitting: Orthopedic Surgery

## 2023-04-09 ENCOUNTER — Other Ambulatory Visit: Payer: Self-pay | Admitting: Orthopedic Surgery

## 2023-04-09 DIAGNOSIS — M25511 Pain in right shoulder: Secondary | ICD-10-CM

## 2023-04-10 ENCOUNTER — Ambulatory Visit
Admission: RE | Admit: 2023-04-10 | Discharge: 2023-04-10 | Disposition: A | Discharge status: Home / Self Care | Payer: No Typology Code available for payment source | Attending: Orthopedic Surgery | Admitting: Orthopedic Surgery

## 2023-04-10 ENCOUNTER — Ambulatory Visit (INDEPENDENT_AMBULATORY_CARE_PROVIDER_SITE_OTHER): Payer: No Typology Code available for payment source | Admitting: Orthopedic Surgery

## 2023-04-10 ENCOUNTER — Ambulatory Visit
Admission: RE | Admit: 2023-04-10 | Discharge: 2023-04-10 | Disposition: A | Discharge status: Home / Self Care | Payer: No Typology Code available for payment source | Source: Ambulatory Visit | Attending: Orthopedic Surgery | Admitting: Orthopedic Surgery

## 2023-04-10 DIAGNOSIS — M25571 Pain in right ankle and joints of right foot: Secondary | ICD-10-CM | POA: Diagnosis not present

## 2023-04-10 DIAGNOSIS — M19011 Primary osteoarthritis, right shoulder: Secondary | ICD-10-CM | POA: Diagnosis not present

## 2023-04-10 DIAGNOSIS — M25561 Pain in right knee: Secondary | ICD-10-CM | POA: Diagnosis not present

## 2023-04-10 DIAGNOSIS — F4312 Post-traumatic stress disorder, chronic: Secondary | ICD-10-CM | POA: Diagnosis not present

## 2023-04-10 DIAGNOSIS — F411 Generalized anxiety disorder: Secondary | ICD-10-CM | POA: Diagnosis not present

## 2023-04-10 DIAGNOSIS — M7501 Adhesive capsulitis of right shoulder: Secondary | ICD-10-CM

## 2023-04-10 DIAGNOSIS — M25511 Pain in right shoulder: Secondary | ICD-10-CM | POA: Insufficient documentation

## 2023-04-10 DIAGNOSIS — F39 Unspecified mood [affective] disorder: Secondary | ICD-10-CM | POA: Diagnosis not present

## 2023-04-10 DIAGNOSIS — F4321 Adjustment disorder with depressed mood: Secondary | ICD-10-CM | POA: Diagnosis not present

## 2023-04-10 DIAGNOSIS — Z634 Disappearance and death of family member: Secondary | ICD-10-CM | POA: Diagnosis not present

## 2023-04-10 NOTE — Progress Notes (Signed)
 New Patient Visit  Assessment: Sydney Valenzuela is a 43 y.o. female with the following: 1. Pain in right ankle and joints of right foot 2. Right shoulder adhesive capsulitis 3. Right knee pain  Plan: LEDIA HANFORD continues to have pain in the right shoulder.  Physical exam is most consistent with adhesive capsulitis.  Repeat radiographs were obtained today, and I do not think she sustained a fracture.  I have recommended a high-volume steroid injection, and she will present to clinic in [place], in order to proceed with this injection.  In regards to the pain she is having the right shoulder, right more than the right ankle, I have recommended physical therapy.  She is interested.  We placed another referral today.   Follow-up: Return for Ultrasound guided injection.  Subjective:  Chief Complaint  Patient presents with   Right Shoulder - Pain    Still having R shld pain, issues with ADLs   Right Knee - Pain, Edema    Still hurts, wears brace, swells end of day, difficult to weight bear   Right Leg - Pain, Edema    Swells end of day from knee down   Right Ankle - Edema, Pain    Many twists, sprains, hurts alot    History of Present Illness: Sydney Valenzuela is a 43 y.o. female who returns to clinic for repeat evaluation of right shoulder, right knee and right ankle pain.  Prior to the last appointment, she had x-rays of the right shoulder.  Upon evaluation of the radiologist, there was some concern that she sustained a minimally displaced fracture.  She continues to have pain.  She returns to clinic for repeat evaluation, and new x-rays.  She continues to have pain in the right ankle.  She has not started physical therapy.  The pain in the ankle, starting to affect her right knee.  Pain is diffuse in the right knee.  Review of Systems: No fevers or chills No numbness or tingling No chest pain No shortness of breath No bowel or bladder dysfunction No GI distress No  headaches    Objective: There were no vitals taken for this visit.  Physical Exam:  General: Alert and oriented. and No acute distress. Gait: Right sided antalgic gait.  Right shoulder without deformity.  No redness.  Pain with forward flexion, as well as abduction.  Limited external rotation at her side, compared to the contralateral side.  Fingers are warm and well-perfused.  Evaluation of the right knee demonstrates a mild effusion.  Diffuse tenderness to palpation.  Knee is stable.  She has decent range of motion.  Right ankle without deformity.  Mild tenderness to palpation.  Toes are warm and well-perfused.  Sensation intact distally.   IMAGING: I personally ordered and reviewed the following images  X-ray of the right shoulder is without acute injury.  No signs of callus formation.  No increased density, consistent with a nondisplaced fracture.  New Medications:  No orders of the defined types were placed in this encounter.     Oneil DELENA Horde, MD  04/10/2023 2:30 PM

## 2023-04-11 NOTE — Addendum Note (Signed)
 Addended by: Cherre Huger E on: 04/11/2023 12:31 PM   Modules accepted: Orders

## 2023-04-16 ENCOUNTER — Ambulatory Visit (INDEPENDENT_AMBULATORY_CARE_PROVIDER_SITE_OTHER): Payer: No Typology Code available for payment source | Admitting: Orthopedic Surgery

## 2023-04-16 DIAGNOSIS — M7501 Adhesive capsulitis of right shoulder: Secondary | ICD-10-CM

## 2023-04-16 NOTE — Patient Instructions (Addendum)
 Instructions Following Joint Injections  In clinic today, you received an injection in one of your joints (sometimes more than one).  Occasionally, you can have some pain at the injection site, this is normal.  You can place ice at the injection site, or take over-the-counter medications such as Tylenol  (acetaminophen ) or Advil  (ibuprofen ).  Please follow all directions listed on the bottle.  If your joint (knee or shoulder) becomes swollen, red or very painful, please contact the clinic for additional assistance.   Two medications were injected, including lidocaine  and a steroid (often referred to as cortisone).  Lidocaine  is effective almost immediately but wears off quickly.  However, the steroid can take a few days to improve your symptoms.  In some cases, it can make your pain worse for a couple of days.  Do not be concerned if this happens as it is common.  You can apply ice or take some over-the-counter medications as needed.   Injections in the same joint cannot be repeated for 3 months.  This helps to limit the risk of an infection in the joint.  If you were to develop an infection in your joint, the best treatment option would be surgery.    Continue to work with physical therapy.  Please let us  know if you have any issues.  MyChart is effective.  I am happy to see him in clinic in [place] or [place] at any time.

## 2023-04-16 NOTE — Progress Notes (Signed)
 New Patient Visit  Assessment: Sydney Valenzuela is a 43 y.o. female with the following: 1. Right shoulder adhesive capsulitis   Plan: JENOVA THUMAN has pain, with restricted motion of the right shoulder, consistent with adhesive capsulitis.  She presented to clinic for an ultrasound-guided steroid injection.  This was completed in clinic today.  She will return to clinic as needed.  She is already set up with physical therapy for her right shoulder, knee and ankle.  I recommended she continue to work with therapy for a good 6 weeks to see if we notice any improvements.   Procedure note injection - Right shoulder, ultrasound guidance   Verbal consent was obtained to inject the Right shoulder, glenohumeral joint  Timeout was completed to confirm the site of injection.   Using the ultrasound, the rotator cuff tendons were identified.  The joint space was also identified. The skin was prepped with alcohol and ethyl chloride was sprayed at the injection site.  A 21-gauge needle was used to inject 40 mg of Depo-Medrol  and 1% lidocaine  (4 cc) and 5 cc of normal saline into the glenohumeral joint space of the Right shoulder using a posterolateral approach.  The needle was visualized entering the glenohumeral joint, and the medication was also visualized. There were no complications.  A sterile bandage was applied.   Note: In order to accurately identify the placement of the needle, ultrasound was required, to increase the accuracy, and specificity of the injection.    Follow-up: Return if symptoms worsen or fail to improve.  Subjective:  Chief Complaint  Patient presents with   Injections    R shoulder   NDC: [phone]    History of Present Illness: Sydney Valenzuela is a 43 y.o. female who returns to clinic for repeat evaluation of right shoulder pain.  I have seen her several times in the [place] office.  She reports a number of falls.  She continues to have pain, and restricted  motion of the right shoulder.  She presents to clinic today for an ultrasound-guided injection.   Review of Systems: No fevers or chills No numbness or tingling No chest pain No shortness of breath No bowel or bladder dysfunction No GI distress No headaches    Objective: There were no vitals taken for this visit.  Physical Exam:  General: Alert and oriented. and No acute distress. Gait: Right sided antalgic gait.  Right shoulder without deformity.  No redness.  Pain with forward flexion, as well as abduction.  Limited external rotation at her side, compared to the contralateral side.  Fingers are warm and well-perfused.   IMAGING: I personally ordered and reviewed the following images  No new imaging obtained today.  New Medications:  No orders of the defined types were placed in this encounter.     Tonita Frater, MD  04/16/2023 9:09 AM

## 2023-04-17 ENCOUNTER — Encounter: Payer: Self-pay | Admitting: Family Medicine

## 2023-04-18 NOTE — Telephone Encounter (Signed)
Virtual Please

## 2023-04-29 DIAGNOSIS — R7303 Prediabetes: Secondary | ICD-10-CM | POA: Diagnosis not present

## 2023-04-29 DIAGNOSIS — Z008 Encounter for other general examination: Secondary | ICD-10-CM | POA: Diagnosis not present

## 2023-04-29 DIAGNOSIS — R2681 Unsteadiness on feet: Secondary | ICD-10-CM | POA: Diagnosis not present

## 2023-04-29 DIAGNOSIS — F17211 Nicotine dependence, cigarettes, in remission: Secondary | ICD-10-CM | POA: Diagnosis not present

## 2023-04-29 DIAGNOSIS — E785 Hyperlipidemia, unspecified: Secondary | ICD-10-CM | POA: Diagnosis not present

## 2023-04-29 DIAGNOSIS — K219 Gastro-esophageal reflux disease without esophagitis: Secondary | ICD-10-CM | POA: Diagnosis not present

## 2023-04-29 DIAGNOSIS — Z6841 Body Mass Index (BMI) 40.0 and over, adult: Secondary | ICD-10-CM | POA: Diagnosis not present

## 2023-04-29 DIAGNOSIS — F4312 Post-traumatic stress disorder, chronic: Secondary | ICD-10-CM | POA: Diagnosis not present

## 2023-04-29 DIAGNOSIS — M797 Fibromyalgia: Secondary | ICD-10-CM | POA: Diagnosis not present

## 2023-04-29 DIAGNOSIS — F332 Major depressive disorder, recurrent severe without psychotic features: Secondary | ICD-10-CM | POA: Diagnosis not present

## 2023-04-29 DIAGNOSIS — M545 Low back pain, unspecified: Secondary | ICD-10-CM | POA: Diagnosis not present

## 2023-05-02 ENCOUNTER — Telehealth (INDEPENDENT_AMBULATORY_CARE_PROVIDER_SITE_OTHER): Payer: No Typology Code available for payment source | Admitting: Family Medicine

## 2023-05-02 ENCOUNTER — Encounter: Payer: Self-pay | Admitting: Family Medicine

## 2023-05-02 DIAGNOSIS — M797 Fibromyalgia: Secondary | ICD-10-CM

## 2023-05-02 DIAGNOSIS — M25561 Pain in right knee: Secondary | ICD-10-CM

## 2023-05-02 DIAGNOSIS — B852 Pediculosis, unspecified: Secondary | ICD-10-CM | POA: Diagnosis not present

## 2023-05-02 MED ORDER — PERMETHRIN 5 % EX CREA: 1.0000 | TOPICAL_CREAM | Freq: Once | CUTANEOUS | 0 refills | Status: AC

## 2023-05-02 NOTE — Progress Notes (Unsigned)
   There were no vitals taken for this visit.   Subjective:    Patient ID: Sydney Valenzuela, female    DOB: 09-Jul-1980, 43 y.o.   MRN: [id number]  HPI: Sydney Valenzuela is a 43 y.o. female  Chief Complaint  Patient presents with   Head Lice    Patient says her grandchildren has a diagnosed with Lice. Patient says she has been trying over the counter treatment for a month. Patient says her daughter was seen by a provider and says they have been diagnosed with a resistant strain and wants to discuss with provider at today's visit.    Pain    Patient would like to discuss having another referral for a Pain Clinic.    Joint Swelling    Patient says she had a fall on her R side. Patient says she seen an Orthopedic Provider and was informed that if she can walk on it, then she does not need imaging. Patient says her knee is now popping and swollen. Patient says she does not mind doing PT and says she would like to discuss.    CHRONIC PAIN  Present dose:  Morphine equivalents Pain control status: {Blank single:19197::"controlled","uncontrolled","better","worse","exacerbated","stable"} Duration: {Blank single:19197::"chronic","months","years"} Location:  Quality: {Blank multiple:19196::"sharp","dull","aching","burning","cramping","ill-defined","itchy","pressure-like","pulling","shooting","sore","stabbing","tender","tearing","throbbing"} Current Pain Level: {Blank single:19197::"mild","moderate","severe","1/10","2/10","3/10","4/10","5/10","6/10","7/10","8/10","9/10","10/10"} Previous Pain Level: {Blank single:19197::"mild","moderate","severe","1/10","2/10","3/10","4/10","5/10","6/10","7/10","8/10","9/10","10/10"} Breakthrough pain: {Blank single:19197::"yes","no"} Benefit from narcotic medications: {Blank single:19197::"yes","no"} What Activities task can be accomplished with current medication? Interested in weaning off narcotics:{Blank single:19197::"yes","no"}   Stool softners/OTC fiber: {Blank  single:19197::"yes","no"}  Previous pain specialty evaluation: {Blank single:19197::"yes","no"} Non-narcotic analgesic meds: {Blank single:19197::"yes","no"} Narcotic contract: {Blank single:19197::"yes","no"}  KNEE PAIN Duration: {Blank single:19197::"chronic","days","weeks","months"} Involved knee: {Blank single:19197::"left","right","bilateral"} Mechanism of injury: {Blank single:19197::"trauma","unknown"} Location:{Blank single:19197::"anterior","posterior","lateral","medial","diffuse"} Onset: {Blank single:19197::"sudden","gradual"} Severity: {Blank single:19197::"mild","moderate","severe","1/10","2/10","3/10","4/10","5/10","6/10","7/10","8/10","9/10","10/10"}  Quality:  {Blank multiple:19196::"sharp","dull","aching","burning","cramping","ill-defined","itchy","pressure-like","pulling","shooting","sore","stabbing","tender","tearing","throbbing"} Frequency: {Blank single:19197::"constant","intermittent","occasional","rare","every few minutes","a few times a hour","a few times a day","a few times a week","a few times a month","a few times a year"} Radiation: {Blank single:19197::"yes","no"} Aggravating factors: {Blank multiple:19196::"weight bearing","walking","running","stairs","bending","movement","prolonged sitting"}  Alleviating factors: {Blank multiple:19196::"nothing","ice","physical therapy","HEP","APAP","NSAIDs","brace","crutches","rest"}  Status: {Blank multiple:19196::"better","worse","stable","fluctuating"} Treatments attempted: {Blank multiple:19196::"none","rest","ice","heat","APAP","ibuprofen","aleve","physical therapy","HEP"}  Relief with NSAIDs?:  {Blank single:19197::"No NSAIDs Taken","no","mild","moderate","significant"} Weakness with weight bearing or walking: {Blank single:19197::"yes","no"} Sensation of giving way: {Blank single:19197::"yes","no"} Locking: {Blank single:19197::"yes","no"} Popping: {Blank single:19197::"yes","no"} Bruising: {Blank  single:19197::"yes","no"} Swelling: {Blank single:19197::"yes","no"} Redness: {Blank single:19197::"yes","no"} Paresthesias/decreased sensation: {Blank single:19197::"yes","no"} Fevers: {Blank single:19197::"yes","no"}  Relevant past medical, surgical, family and social history reviewed and updated as indicated. Interim medical history since our last visit reviewed. Allergies and medications reviewed and updated.  Review of Systems  Per HPI unless specifically indicated above     Objective:    There were no vitals taken for this visit.  Wt Readings from Last 3 Encounters:  02/25/23 242 lb 8 oz (110 kg)  12/30/22 231 lb 9.6 oz (105.1 kg)  11/25/22 236 lb 9.6 oz (107.3 kg)    Physical Exam  Results for orders placed or performed in visit on 01/28/23  Pregnancy, urine   Collection Time: 01/28/23  3:48 PM  Result Value Ref Range   Preg Test, Ur Negative Negative      Assessment & Plan:   Problem List Items Addressed This Visit   None    Follow up plan: No follow-ups on file.

## 2023-05-04 ENCOUNTER — Encounter: Payer: Self-pay | Admitting: Family Medicine

## 2023-05-04 NOTE — Assessment & Plan Note (Signed)
Was anxious about seeing pain management previously- will get her into see them. Referral placed.

## 2023-05-04 NOTE — Assessment & Plan Note (Signed)
To do PT through ortho. Will get x-ray. Call with any concerns.

## 2023-05-06 ENCOUNTER — Telehealth: Payer: Self-pay | Admitting: Physical Therapy

## 2023-05-06 ENCOUNTER — Encounter: Payer: Self-pay | Admitting: Family Medicine

## 2023-05-06 ENCOUNTER — Ambulatory Visit: Payer: No Typology Code available for payment source | Admitting: Physical Therapy

## 2023-05-06 NOTE — Telephone Encounter (Signed)
Called pt to check on recent health issue. Pt still needs time to recover and she will be cancelling for today's appointment.

## 2023-05-08 ENCOUNTER — Ambulatory Visit: Payer: No Typology Code available for payment source | Admitting: Physical Therapy

## 2023-05-08 ENCOUNTER — Telehealth: Payer: Self-pay | Admitting: Physical Therapy

## 2023-05-08 ENCOUNTER — Telehealth: Payer: Self-pay | Admitting: Family Medicine

## 2023-05-08 ENCOUNTER — Telehealth: Payer: No Typology Code available for payment source | Admitting: Physician Assistant

## 2023-05-08 DIAGNOSIS — B850 Pediculosis due to Pediculus humanus capitis: Secondary | ICD-10-CM

## 2023-05-08 NOTE — Telephone Encounter (Signed)
 Called pt to confirm apt. Pt did not pickup so left VM instructing pt to call back to confirm.

## 2023-05-08 NOTE — Telephone Encounter (Signed)
 Called pt to inquire about whether she would be able to attend her apt. Her husband answered for her and said she had been informed that her apts for week had been cancelled. PT informed him that today's apt had not been cancelled. Pt's husband said she had already not planned on coming and to take her off schedule. Pt remained pt to call in next time and that his wife's next apt would be next Thursday.

## 2023-05-08 NOTE — Telephone Encounter (Signed)
 Pt is calling in because she wants to know if Dr. Vicci can send her in another medication for head lice. Pt says she was told by Dr. Vicci if the medication didn't work to call her back and she would send her in something different. Pt is requesting that medication be sent to [facility]. Please advise.

## 2023-05-08 NOTE — Progress Notes (Signed)
  Because of ongoing symptoms and multiple treatment failures including prescription permethrin , I feel your condition warrants further evaluation and I recommend that you be seen in a face-to-face visit.   NOTE: There will be NO CHARGE for this E-Visit   If you are having a true medical emergency, please call 911.     For an urgent face to face visit, [facility] has multiple urgent care centers for your convenience.  Click the link below for the full list of locations and hours, walk-in wait times, appointment scheduling options and driving directions:  Urgent Care - [place], [place], [place], [place], [place], [place], [place]  [place]     Your MyChart E-visit questionnaire answers were reviewed by a board certified advanced clinical practitioner to complete your personal care plan based on your specific symptoms.    Thank you for using e-Visits.

## 2023-05-09 ENCOUNTER — Telehealth: Payer: No Typology Code available for payment source | Admitting: Pediatrics

## 2023-05-09 ENCOUNTER — Encounter: Payer: Self-pay | Admitting: Pediatrics

## 2023-05-09 DIAGNOSIS — B850 Pediculosis due to Pediculus humanus capitis: Secondary | ICD-10-CM | POA: Diagnosis not present

## 2023-05-09 DIAGNOSIS — M797 Fibromyalgia: Secondary | ICD-10-CM | POA: Diagnosis not present

## 2023-05-09 DIAGNOSIS — M25561 Pain in right knee: Secondary | ICD-10-CM | POA: Diagnosis not present

## 2023-05-09 DIAGNOSIS — F4310 Post-traumatic stress disorder, unspecified: Secondary | ICD-10-CM | POA: Diagnosis not present

## 2023-05-09 DIAGNOSIS — G8929 Other chronic pain: Secondary | ICD-10-CM

## 2023-05-09 DIAGNOSIS — Z1330 Encounter for screening examination for mental health and behavioral disorders, unspecified: Secondary | ICD-10-CM

## 2023-05-09 MED ORDER — IVERMECTIN 3 MG PO TABS: 200.0000 ug/kg | ORAL_TABLET | Freq: Once | ORAL | 1 refills | Status: AC

## 2023-05-09 NOTE — Patient Instructions (Signed)
 1 dose of ivermectin  for you translates into 7.5 tabs (calculated based on weight).

## 2023-05-09 NOTE — Progress Notes (Signed)
   05/09/2023  Patient ID: Sydney Valenzuela, female   DOB: 1981/02/15, 43 y.o.   MRN: [id number]  In basket request from Dr. Herold regarding ivermectin  dosing for refractory head lice.  While not yet approved for this indication, this CDC (based on trial evidence) recommend 200 to 400 mcg/kg dosing once repeated in 9 to 10 days.  Ivermectin  is available as 3mg  oral tablets.  Based on patient weight, appropriate dosing would be 22mg  or 7.5 tablets once, repeated in 10 days.  Channing DELENA Mealing, PharmD, DPLA

## 2023-05-09 NOTE — Progress Notes (Signed)
 Appointment has been made. Called to inform pt of appointment.
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
## 2023-05-09 NOTE — Assessment & Plan Note (Signed)
 Exacerbated by stress and anxiety. Currently managed with muscle relaxers as needed. -Continue current management plan. -Encourage patient to continue with intensive trauma therapy as this may help with fibromyalgia symptoms.

## 2023-05-09 NOTE — Telephone Encounter (Signed)
 Seen by Dr. Juliette Oh

## 2023-05-09 NOTE — Progress Notes (Signed)
 Telehealth Visit  I connected with  Sydney Valenzuela on 05/09/23 by a video enabled telemedicine application and verified that I am speaking with the correct person using two identifiers.   I discussed the limitations of evaluation and management by telemedicine. The patient expressed understanding and agreed to proceed.  Subjective:    Patient ID: Sydney Valenzuela, female    DOB: 1980-11-08, 43 y.o.   MRN: [id number]  HPI: Sydney Valenzuela is a 43 y.o. female  Chief Complaint  Patient presents with   Head Lice    Saw Dr. Vicci. Received from grandkids. Medication did not work and just made it work.      Discussed the use of AI scribe software for clinical note transcription with the patient, who gave verbal consent to proceed.  History of Present Illness   Sydney Valenzuela is a 43 year old female who presents with persistent head lice infestation. She is accompanied by her daughter. She was informed by her grandchildren's doctor about a resistant strain of lice in her area.  She has been dealing with a persistent head lice infestation for the past two months, which began after her grandchildren contracted lice. Despite extensive efforts, including laundering and treating her living environment, the lice persist.  She has used permethrin , as prescribed by her grandchildren's doctor, but it has not been effective. She has also tried over-the-counter treatments, including a topical ivermectin  product, which provided only temporary relief. She has spent approximately $300 on various treatments and has been using a lice comb exclusively for hair grooming. Her daughter has been involved in managing the situation, including cutting her hair to shoulder length.  She is currently taking Lamictal at a dose of 150 mg, two tablets at bedtime. She has been in communication with her healthcare providers through MyChart regarding the ongoing lice issue.     Relevant past medical, surgical, family and  social history reviewed and updated as indicated. Interim medical history since our last visit reviewed. Allergies and medications reviewed and updated.  ROS per HPI unless specifically indicated above     Objective:    There were no vitals taken for this visit.  Wt Readings from Last 3 Encounters:  02/25/23 242 lb 8 oz (110 kg)  12/30/22 231 lb 9.6 oz (105.1 kg)  11/25/22 236 lb 9.6 oz (107.3 kg)    Physical Exam Constitutional:      General: She is not in acute distress.    Appearance: Normal appearance.  Neurological:     General: No focal deficit present.     Mental Status: She is alert. Mental status is at baseline.     LIMITED EXAM GIVEN VIDEO VISIT     05/09/2023    8:52 AM 02/25/2023    8:13 AM 12/30/2022    4:08 PM 11/25/2022    3:34 PM 08/27/2022    9:26 AM  Depression screen PHQ 2/9  Decreased Interest 3 3 2 2 3   Down, Depressed, Hopeless 3 2 2 2 3   PHQ - 2 Score 6 5 4 4 6   Altered sleeping 3 2 3 3 3   Tired, decreased energy 3 3 3 3 3   Change in appetite 3 3 3 3 3   Feeling bad or failure about yourself  3 3 2 3 3   Trouble concentrating 3 3 3 3 3   Moving slowly or fidgety/restless 3 1 3 2 3   Suicidal thoughts 1 0 1 1 1   PHQ-9 Score 25 20  22 22 25   Difficult doing work/chores Extremely dIfficult Extremely dIfficult Extremely dIfficult Extremely dIfficult Extremely dIfficult      05/09/2023    8:54 AM 02/25/2023    8:14 AM 12/30/2022    4:08 PM 11/25/2022    3:34 PM  GAD 7 : Generalized Anxiety Score  Nervous, Anxious, on Edge 3 3 3 3   Control/stop worrying 3 3 3 3   Worry too much - different things 3 3 3 3   Trouble relaxing 3 3  3   Restless 3 3 3 3   Easily annoyed or irritable 2 0 3 3  Afraid - awful might happen 3 0 0 3  Total GAD 7 Score 20 15  21   Anxiety Difficulty Extremely difficult Not difficult at all Extremely difficult Extremely difficult      Assessment & Plan:  Assessment & Plan   Pediculosis capitis Persistent despite multiple  treatments with permethrin  and over-the-counter products. Discussed resistance patterns and the need for a different approach. -Plan to prescribe oral Ivermectin , one dose now and a second dose in 7-10 days if lice persist. -Schedule follow-up in 7 days to assess response to treatment. -     Ivermectin ; Take 7.5 tablets (22,500 mcg total) by mouth once for 1 dose. Please follow packaging instructions. Sent 1 refill to take after 7 days if persistant symptoms.  Dispense: 8 tablet; Refill: 1  PTSD (post-traumatic stress disorder) Assessment & Plan: Exacerbated by recent traumatic events and chronic health issues. Currently under the care of psychiatry and undergoing intensive trauma therapy. -Plan to touch base with psychiatry to discuss potential adjustments to current treatment plan. -has follow-up in 2 weeks with psychiatry.   Fibromyalgia Assessment & Plan: Exacerbated by stress and anxiety. Currently managed with muscle relaxers as needed. -Continue current management plan. -Encourage patient to continue with intensive trauma therapy as this may help with fibromyalgia symptoms.    Chronic pain of right knee Assessment & Plan: Recurrent twisting injuries leading to pain and swelling. Currently awaiting physical therapy. Likely related to compensatory changes due to ankle injury. X-ray ordered but not yet completed. -Plan to complete X-ray next week. -Plan to start physical therapy next week. -Continue to monitor and manage pain as needed.   Encounter for behavioral health screening As part of their intake evaluation, the patient was screened for depression, anxiety.  PHQ9 SCORE 25, GAD7 SCORE 20. Screening results positive for tested conditions. See plan under problem/diagnosis above. No acute safety concerns at this time.  Follow up plan: Return in about 1 week (around 05/16/2023) for (virtual).  Chestina Komatsu P Jermarcus Mcfadyen, MD   This visit was completed via video visit through MyChart due  to the restrictions of the COVID-19 pandemic. All issues as above were discussed and addressed. Physical exam was done as above through visual confirmation on video through MyChart. If it was felt that the patient should be evaluated in the office, they were directed there. The patient verbally consented to this visit.} Location of the patient: home Location of the provider: work Those involved with this call:  Provider: Hadassah Nett, MD CMA: Comer Sarah, CMA Time spent on call:  20 minutes with patient face to face via video conference. More than 50% of this time was spent in counseling and coordination of care. 10 minutes total spent in review of patient's record and preparation of their chart. Total time spent on this encounter: 30 minutes.

## 2023-05-09 NOTE — Assessment & Plan Note (Signed)
 Recurrent twisting injuries leading to pain and swelling. Currently awaiting physical therapy. Likely related to compensatory changes due to ankle injury. X-ray ordered but not yet completed. -Plan to complete X-ray next week. -Plan to start physical therapy next week. -Continue to monitor and manage pain as needed.

## 2023-05-13 ENCOUNTER — Ambulatory Visit: Payer: No Typology Code available for payment source | Admitting: Physical Therapy

## 2023-05-14 ENCOUNTER — Encounter: Payer: Self-pay | Admitting: Pediatrics

## 2023-05-14 ENCOUNTER — Telehealth: Payer: No Typology Code available for payment source | Admitting: Pediatrics

## 2023-05-14 DIAGNOSIS — B852 Pediculosis, unspecified: Secondary | ICD-10-CM | POA: Diagnosis not present

## 2023-05-14 DIAGNOSIS — J019 Acute sinusitis, unspecified: Secondary | ICD-10-CM

## 2023-05-14 DIAGNOSIS — B9689 Other specified bacterial agents as the cause of diseases classified elsewhere: Secondary | ICD-10-CM | POA: Diagnosis not present

## 2023-05-14 MED ORDER — AMOXICILLIN 500 MG PO CAPS
500.0000 mg | ORAL_CAPSULE | Freq: Three times a day (TID) | ORAL | 0 refills | Status: DC
Start: 2023-05-14 — End: 2023-05-20
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
## 2023-05-14 NOTE — Patient Instructions (Addendum)
Please start amoxicillin three times daily. If resolved symptoms after 5 days, ok to stop, if not please take the full 10 day course.  Most cold symptoms last up to 2 weeks, but cough can sometimes linger up to 4 weeks.  However if your symtpoms get WORSE - like you develop fevers or get more shortness of breath, then call your clinic as you may need to be evaluated.   Aches and Pains Acetaminophen (Tylenol): 1000mg  ("extra strength" tablets are 500mg , so take 2) every 8 hours if needed  Ibuprofen (Advil/Motrin) 400-800mg  (comes in 200mg  pills OTC, so 2-4 pills) every 8 hours. - Avoid in excess if cardiac blood pressure issues  Sore Throat:  See Aches and Pains meds above, also Sore throat sprays and lozenges may also help.   Cough:  Honey 2 TBS every 4-6 hours if needed.  Robitussin DM syrup or generic equivalent which has (guaifenesin = an expectorant to help you get stuff up + dextromethorphan (DM) = cough supressant). You can also get this in tablet formula (like Mucinex DM or generic equivalent).  If you have asthma or are wheezing and have a tight chest, then albuterol inhaler (Ventolin, ProAir) may be helpful - you need a prescription for this.   Congestion:  oxymetazoline (Afrin) nasal stray: 2 sprays each nostril every 12 hours. Don't use more than 3 days in a row to avoid building a tolerance to it.  Sinus rinse (neti pot) high volume sinus rinse can help open up your sinuses and be helpful, especially if you're having sinus pressure and headaches.   Other:  Umcka (pelargonium sidoides extract) can to shorten cold symptoms (can be hard to find, but [employer] carries it: brand name Umcka ColdCare from [employer]). Works best if you start taking at earliest signs of cold symptoms.  Andrographis paniculata is another herbal remedy with less evidence, but may reduce common cold symptoms in adults.  zinc acetate lozenges >= 80 mg/day reduces duration but not severity of cold  symptoms in adults, but it is associated with bad taste and nausea Heated humidified air may reduce cold symptoms, so try using a humidifier - especially in your bedroom at night.  Stay hydrated! Aim to drink at least 2 liters of water daily.   What doesn't work (but lots of folks think might) Vitamin C: bummer right?! But there's no evidence that high dose vitamin C will help cold symptoms.

## 2023-05-15 ENCOUNTER — Other Ambulatory Visit: Payer: Self-pay | Admitting: Pediatrics

## 2023-05-15 ENCOUNTER — Encounter: Payer: No Typology Code available for payment source | Admitting: Physical Therapy

## 2023-05-15 DIAGNOSIS — Z20828 Contact with and (suspected) exposure to other viral communicable diseases: Secondary | ICD-10-CM

## 2023-05-15 MED ORDER — OSELTAMIVIR PHOSPHATE 75 MG PO CAPS: 75.0000 mg | ORAL_CAPSULE | Freq: Every day | ORAL | 0 refills | Status: AC

## 2023-05-15 NOTE — Progress Notes (Signed)
Start PEP tamiflu husband tested positive today. Suspected recurrent sinusitis earlier this week. Start tamiflu for 5 days and if not feeling better from sinus by Saturday, start previously rx amoxicillin.  Trisa Cranor Howell Pringle, MD

## 2023-05-16 ENCOUNTER — Encounter: Payer: Self-pay | Admitting: Family Medicine

## 2023-05-16 MED ORDER — PREDNISONE 50 MG PO TABS: 50.0000 mg | ORAL_TABLET | Freq: Every day | ORAL | 0 refills | Status: DC

## 2023-05-18 ENCOUNTER — Other Ambulatory Visit: Payer: Self-pay | Admitting: Family Medicine

## 2023-05-19 NOTE — Telephone Encounter (Signed)
 Requested Prescriptions  Pending Prescriptions Disp Refills   pantoprazole (PROTONIX) 40 MG tablet [Pharmacy Med Name: PANTOPRAZOLE SOD DR 40 MG TAB] 180 tablet 1    Sig: TAKE 1 TABLET BY MOUTH 2 TIMES A DAY     Gastroenterology: Proton Pump Inhibitors Passed - 05/19/2023  5:40 PM      Passed - Valid encounter within last 12 months    Recent Outpatient Visits           2 weeks ago Fibromyalgia   [place] [facility] [place], Megan P, DO   2 months ago Left sided sciatica   [place] [facility] [place], Megan P, DO   3 months ago Encounter for removal and reinsertion of intrauterine contraceptive device   [place] [facility] Jackolyn Confer, MD   3 months ago Menorrhagia due to intrauterine device (IUD) [facility])   [place] [facility] Jackolyn Confer, MD   3 months ago Left sided sciatica   [place] [facility] [place], Oralia Rud, DO       Future Appointments             Tomorrow Evelene Croon, Atilano Median, MD [place] [facility]   In 1 week Laural Benes, Oralia Rud, DO [facility], [facility]

## 2023-05-20 ENCOUNTER — Ambulatory Visit: Payer: No Typology Code available for payment source | Admitting: Physical Therapy

## 2023-05-20 ENCOUNTER — Telehealth: Payer: No Typology Code available for payment source | Admitting: Pediatrics

## 2023-05-20 ENCOUNTER — Encounter: Payer: No Typology Code available for payment source | Admitting: Physical Therapy

## 2023-05-20 ENCOUNTER — Encounter: Payer: Self-pay | Admitting: Pediatrics

## 2023-05-20 DIAGNOSIS — B852 Pediculosis, unspecified: Secondary | ICD-10-CM

## 2023-05-20 DIAGNOSIS — N898 Other specified noninflammatory disorders of vagina: Secondary | ICD-10-CM | POA: Diagnosis not present

## 2023-05-20 DIAGNOSIS — L680 Hirsutism: Secondary | ICD-10-CM

## 2023-05-20 DIAGNOSIS — Z20828 Contact with and (suspected) exposure to other viral communicable diseases: Secondary | ICD-10-CM | POA: Diagnosis not present

## 2023-05-20 DIAGNOSIS — Z1330 Encounter for screening examination for mental health and behavioral disorders, unspecified: Secondary | ICD-10-CM

## 2023-05-20 MED ORDER — IVERMECTIN 3 MG PO TABS: 200.0000 ug/kg | ORAL_TABLET | Freq: Once | ORAL | 0 refills | Status: AC

## 2023-05-20 MED ORDER — ONDANSETRON 4 MG PO TBDP: 4.0000 mg | ORAL_TABLET | Freq: Four times a day (QID) | ORAL | 0 refills | Status: AC | PRN

## 2023-05-20 NOTE — Progress Notes (Signed)
 Telehealth Visit  I connected with  Sydney Valenzuela on 05/20/23 by a video enabled telemedicine application and verified that I am speaking with the correct person using two identifiers.   I discussed the limitations of evaluation and management by telemedicine. The patient expressed understanding and agreed to proceed.  Subjective:    Patient ID: Sydney Valenzuela, female    DOB: 05-31-1980, 43 y.o.   MRN: [id number]  HPI: Sydney Valenzuela is a 43 y.o. female  Chief Complaint  Patient presents with   Sinusitis   Head Lice    Discussed the use of AI scribe software for clinical note transcription with the patient, who gave verbal consent to proceed.  History of Present Illness   Sydney Valenzuela is a 43 year old female who presents with persistent symptoms following a flu infection and ongoing head lice infestation.  She continues to experience symptoms following a recent flu infection, including a sore throat, congestion, and headaches. The headaches, initially severe and migraine-like, have improved but were previously so intense that she and her husband had to darken her home environment. Nausea, particularly when cooking, has improved but still occurs occasionally. Energy levels fluctuate, and she feels more easily fatigued than usual. She has been using sinus rinses and essential oils to manage her symptoms and is completing a course of Tamiflu today.  She is dealing with a persistent head lice infestation. Ivermectin treatment initially seemed effective, but symptoms returned around the sixth day. Challenges in managing the lice are attributed to her recent flu, which affected her ability to maintain usual hygiene practices. She has been diligent in avoiding cross-contamination by not using hairbrushes, frequently washing blankets, and avoiding wearing jackets. Her family, including her daughter and grandchildren, were treated with [place], which appears to have been effective for them. She  plans to take a second dose of ivermectin as she continues to experience symptoms.  She has a history of sinus issues and underwent sinus surgery years ago. Initially suspecting a bacterial sinus infection, she took antibiotics, but they were ineffective. She has been using sinus rinses less frequently, now about once every other day, and has increased drainage recently.  She reports changes in her body hair and vaginal discharge, which she attributes to possible perimenopausal changes. Increased facial and body hair growth and significant vaginal discharge, sometimes feeling like urination at night, began about a month after receiving an IUD, which has helped with hot flashes and mood swings.      Relevant past medical, surgical, family and social history reviewed and updated as indicated. Interim medical history since our last visit reviewed. Allergies and medications reviewed and updated.  ROS per HPI unless specifically indicated above     Objective:    There were no vitals taken for this visit.  Wt Readings from Last 3 Encounters:  02/25/23 242 lb 8 oz (110 kg)  12/30/22 231 lb 9.6 oz (105.1 kg)  11/25/22 236 lb 9.6 oz (107.3 kg)     Physical Exam Constitutional:      General: She is not in acute distress.    Appearance: Normal appearance.  Neurological:     General: No focal deficit present.     Mental Status: She is alert. Mental status is at baseline.    LIMITED EXAM GIVEN VIDEO VISIT     05/20/2023    9:30 AM 05/09/2023    8:52 AM 02/25/2023    8:13 AM 12/30/2022    4:08 PM  11/25/2022    3:34 PM  Depression screen PHQ 2/9  Decreased Interest 3 3 3 2 2   Down, Depressed, Hopeless 3 3 2 2 2   PHQ - 2 Score 6 6 5 4 4   Altered sleeping 3 3 2 3 3   Tired, decreased energy 3 3 3 3 3   Change in appetite 2 3 3 3 3   Feeling bad or failure about yourself  3 3 3 2 3   Trouble concentrating 3 3 3 3 3   Moving slowly or fidgety/restless 2 3 1 3 2   Suicidal thoughts 1 1 0 1 1   PHQ-9 Score 23 25 20 22 22   Difficult doing work/chores Extremely dIfficult Extremely dIfficult Extremely dIfficult Extremely dIfficult Extremely dIfficult      05/20/2023    9:32 AM 05/09/2023    8:54 AM 02/25/2023    8:14 AM 12/30/2022    4:08 PM  GAD 7 : Generalized Anxiety Score  Nervous, Anxious, on Edge 3 3 3 3   Control/stop worrying 3 3 3 3   Worry too much - different things 3 3 3 3   Trouble relaxing 3 3 3    Restless 2 3 3 3   Easily annoyed or irritable 2 2 0 3  Afraid - awful might happen 3 3 0 0  Total GAD 7 Score 19 20 15    Anxiety Difficulty Extremely difficult Extremely difficult Not difficult at all Extremely difficult      Assessment & Plan:  Assessment & Plan   Lice Persistent despite initial treatment with Ivermectin. Would recommend in person eval (next week) if does not resolve with second round. -Start second dose of Ivermectin today or within the next three days. -     Ivermectin; Take 7.5 tablets (22,500 mcg total) by mouth once for 1 dose.  Dispense: 8 tablet; Refill: 0 -     Ondansetron; Take 1 tablet (4 mg total) by mouth every 6 (six) hours as needed for up to 3 days for nausea or vomiting.  Dispense: 12 tablet; Refill: 0  Exposure to influenza Improving symptoms of sore throat, congestion, and headache. Persistent post-flu symptoms of sinus congestion and drainage. History of sinus surgery. Nausea persists but is less severe. Last day of Tamiflu treatment. -Continue supportive care and over-the-counter remedies as needed. -Prescribe Zofran for nausea   Vaginal discharge Hirsutism  Reports of increased facial and body hair growth, and increased vaginal discharge. No other symptoms of hormonal imbalance. Unrelated timing to IUD placement, though did note some  -Consider repeating labs for updated baseline. -Plan for in-person follow-up with Dr. Laural Benes on 05/29/2023 to further evaluate these symptoms.  Encounter for behavioral health screening As part of  their intake evaluation, the patient was screened for depression, anxiety.  PHQ9 SCORE 23, GAD7 SCORE 19. Screening results positive for tested conditions. Has follow up scheduled with PCP to discuss. No changes for today to current medication management.    Follow up plan: Return in about 1 week (around 05/27/2023) for lice. she is already scheduled.  Haeley Fordham P Nyrie Sigal, MD  This visit was completed via video visit through MyChart due to the restrictions of the COVID-19 pandemic. All issues as above were discussed and addressed. Physical exam was done as above through visual confirmation on video through MyChart. If it was felt that the patient should be evaluated in the office, they were directed there. The patient verbally consented to this visit."} Location of the patient: home Location of the provider: work Those involved with this  call:  Provider: Modena Nunnery, MD CMA: Alger Memos, CMA Time spent on call:  15 minutes with patient face to face via video conference. More than 50% of this time was spent in counseling and coordination of care. 15 minutes total spent in review of patient's record and preparation of their chart. Total time spent on this encounter: 30 minutes.

## 2023-05-20 NOTE — Patient Instructions (Addendum)
 For head lice: Take the next round of ivermectin  I sent some zofran in case you have nausea.  Most cold symptoms last up to 2 weeks, but cough can sometimes linger up to 4 weeks.  However if your symtpoms get WORSE - like you develop fevers or get more shortness of breath, then call your clinic as you may need to be evaluated.   Aches and Pains Acetaminophen (Tylenol): 1000mg  ("extra strength" tablets are 500mg , so take 2) every 8 hours if needed   Sore Throat:  See Aches and Pains meds above, also Sore throat sprays and lozenges may also help.   Cough:  Honey 2 TBS every 4-6 hours if needed.  Robitussin DM syrup or generic equivalent which has (guaifenesin = an expectorant to help you get stuff up + dextromethorphan (DM) = cough supressant). You can also get this in tablet formula (like Mucinex DM or generic equivalent).  If you have asthma or are wheezing and have a tight chest, then albuterol inhaler (Ventolin, ProAir) may be helpful - you need a prescription for this.   Congestion:  oxymetazoline (Afrin) nasal stray: 2 sprays each nostril every 12 hours. Don't use more than 3 days in a row to avoid building a tolerance to it.  Sinus rinse (neti pot) high volume sinus rinse can help open up your sinuses and be helpful, especially if you're having sinus pressure and headaches.   Other:  Umcka (pelargonium sidoides extract) can to shorten cold symptoms (can be hard to find, but [employer] carries it: brand name Umcka ColdCare from [employer]). Works best if you start taking at earliest signs of cold symptoms.  Andrographis paniculata is another herbal remedy with less evidence, but may reduce common cold symptoms in adults.  zinc acetate lozenges >= 80 mg/day reduces duration but not severity of cold symptoms in adults, but it is associated with bad taste and nausea Heated humidified air may reduce cold symptoms, so try using a humidifier - especially in your bedroom at night.  Stay  hydrated! Aim to drink at least 2 liters of water daily.   What doesn't work (but lots of folks think might) Vitamin C: bummer right?! But there's no evidence that high dose vitamin C will help cold symptoms. Antibiotics: colds come from viruses - which antibiotics don't kill . . . But they will kill your health/natural bacteria in your body and can build resistant 'super bugs'

## 2023-05-21 ENCOUNTER — Encounter: Payer: Self-pay | Admitting: Family Medicine

## 2023-05-21 ENCOUNTER — Telehealth: Payer: No Typology Code available for payment source | Admitting: Family Medicine

## 2023-05-21 DIAGNOSIS — J1000 Influenza due to other identified influenza virus with unspecified type of pneumonia: Secondary | ICD-10-CM

## 2023-05-21 MED ORDER — DOXYCYCLINE HYCLATE 100 MG PO TABS: 100.0000 mg | ORAL_TABLET | Freq: Two times a day (BID) | ORAL | 0 refills | Status: DC

## 2023-05-21 MED ORDER — BENZONATATE 200 MG PO CAPS: 200.0000 mg | ORAL_CAPSULE | Freq: Two times a day (BID) | ORAL | 0 refills | Status: DC | PRN

## 2023-05-21 NOTE — Progress Notes (Signed)
 There were no vitals taken for this visit.   Subjective:    Patient ID: Sydney Valenzuela, female    DOB: 01/17/1981, 43 y.o.   MRN: [id number]  HPI: Sydney Valenzuela is a 43 y.o. female  Chief Complaint  Patient presents with   Influenza   UPPER RESPIRATORY TRACT INFECTION Duration: Was doing great yesterday, then started to feel significantly worse again last night Worst symptom: congestion, cough Fever: no Cough: yes Shortness of breath: yes Wheezing: yes Chest pain: yes, with cough Chest tightness: yes Chest congestion: yes Nasal congestion: yes Runny nose: no Post nasal drip: yes Sneezing: no Sore throat: no Swollen glands: no Sinus pressure: yes Headache: yes Face pain: no Toothache: no Ear pain: no  Ear pressure: no  Eyes red/itching:no Eye drainage/crusting: no  Vomiting: no Rash: no Fatigue: yes Sick contacts: yes Strep contacts: no  Context: worse Recurrent sinusitis: no Relief with OTC cold/cough medications: no  Treatments attempted: tamiflu, amoxicillin, tessalon, prednisone   Relevant past medical, surgical, family and social history reviewed and updated as indicated. Interim medical history since our last visit reviewed. Allergies and medications reviewed and updated.  Review of Systems  Constitutional:  Positive for fatigue. Negative for fever.  HENT:  Positive for congestion, postnasal drip and rhinorrhea. Negative for dental problem, drooling, ear discharge, ear pain, facial swelling, hearing loss, mouth sores, nosebleeds, sinus pressure, sinus pain, sneezing, sore throat and tinnitus.   Respiratory:  Positive for cough, chest tightness, shortness of breath and wheezing. Negative for apnea, choking and stridor.   Cardiovascular: Negative.   Gastrointestinal: Negative.   Psychiatric/Behavioral: Negative.      Per HPI unless specifically indicated above     Objective:    There were no vitals taken for this visit.  Wt Readings from Last 3  Encounters:  02/25/23 242 lb 8 oz (110 kg)  12/30/22 231 lb 9.6 oz (105.1 kg)  11/25/22 236 lb 9.6 oz (107.3 kg)    Physical Exam Vitals and nursing note reviewed.  Constitutional:      General: She is not in acute distress.    Appearance: Normal appearance. She is not ill-appearing, toxic-appearing or diaphoretic.  HENT:     Head: Normocephalic and atraumatic.     Right Ear: External ear normal.     Left Ear: External ear normal.     Nose: Nose normal.     Mouth/Throat:     Mouth: Mucous membranes are moist.     Pharynx: Oropharynx is clear.  Eyes:     General: No scleral icterus.       Right eye: No discharge.        Left eye: No discharge.     Conjunctiva/sclera: Conjunctivae normal.     Pupils: Pupils are equal, round, and reactive to light.  Pulmonary:     Effort: Pulmonary effort is normal. No respiratory distress.     Comments: Speaking in full sentences Musculoskeletal:        General: Normal range of motion.     Cervical back: Normal range of motion.  Skin:    Coloration: Skin is not jaundiced or pale.     Findings: No bruising, erythema, lesion or rash.  Neurological:     Mental Status: She is alert and oriented to person, place, and time. Mental status is at baseline.  Psychiatric:        Mood and Affect: Mood normal.        Behavior: Behavior normal.  Thought Content: Thought content normal.        Judgment: Judgment normal.     Results for orders placed or performed in visit on 01/28/23  Pregnancy, urine   Collection Time: 01/28/23  3:48 PM  Result Value Ref Range   Preg Test, Ur Negative Negative      Assessment & Plan:   Problem List Items Addressed This Visit   None Visit Diagnoses       Pneumonia due to influenza A virus    -  Primary   Will treat with doxycycline and tessalon. Call with any concerns or if getting worse. Has follow up scheduled for next week already.   Relevant Medications   doxycycline (VIBRA-TABS) 100 MG tablet    benzonatate (TESSALON) 200 MG capsule        Follow up plan: Return for As scheduled.    This visit was completed via video visit through MyChart due to the restrictions of the COVID-19 pandemic. All issues as above were discussed and addressed. Physical exam was done as above through visual confirmation on video through MyChart. If it was felt that the patient should be evaluated in the office, they were directed there. The patient verbally consented to this visit. Location of the patient: home Location of the provider: home Those involved with this call:  Provider: Olevia Perches, DO CMA:  Arnetha Gula, CMA Front Desk/Registration:  Servando Snare   Time spent on call:  15 minutes with patient face to face via video conference. More than 50% of this time was spent in counseling and coordination of care. 23 minutes total spent in review of patient's record and preparation of their chart.

## 2023-05-21 NOTE — Telephone Encounter (Signed)
 Scheduled patient and her husband for this morning.

## 2023-05-21 NOTE — Telephone Encounter (Signed)
 I can see both these patients virtually this AM

## 2023-05-22 ENCOUNTER — Encounter: Payer: No Typology Code available for payment source | Admitting: Physical Therapy

## 2023-05-25 ENCOUNTER — Other Ambulatory Visit: Payer: Self-pay | Admitting: Family Medicine

## 2023-05-26 ENCOUNTER — Ambulatory Visit: Payer: No Typology Code available for payment source | Attending: Orthopedic Surgery | Admitting: Physical Therapy

## 2023-05-26 ENCOUNTER — Ambulatory Visit: Payer: No Typology Code available for payment source | Admitting: Physical Therapy

## 2023-05-26 ENCOUNTER — Encounter: Payer: No Typology Code available for payment source | Admitting: Physical Therapy

## 2023-05-27 NOTE — Telephone Encounter (Signed)
 Requested medication (s) are due for refill today: na  Requested medication (s) are on the active medication list: yes  Last refill:  baclofen-03/28/23 #30 1 refills, lyrica- 826/24 #120 5 refills  Future visit scheduled: yes tomorrow  Notes to clinic:  not delegated per protocol. Do you want to refill Rxs?     Requested Prescriptions  Pending Prescriptions Disp Refills   baclofen (LIORESAL) 10 MG tablet [Pharmacy Med Name: BACLOFEN 10 MG TABLET] 30 tablet 1    Sig: TAKE 1 TABLET BY MOUTH EVERY NIGHT AT BEDTIME AS NEEDED FOR MUSCLE SPASMS     Analgesics:  Muscle Relaxants - baclofen Failed - 05/27/2023 10:30 AM      Failed - Cr in normal range and within 180 days    Creatinine, Ser  Date Value Ref Range Status  11/25/2022 0.65 0.57 - 1.00 mg/dL Final         Failed - eGFR is 30 or above and within 180 days    GFR calc Af Amer  Date Value Ref Range Status  02/26/2017 133 >59 mL/min/1.73 Final   GFR calc non Af Amer  Date Value Ref Range Status  02/26/2017 115 >59 mL/min/1.73 Final   eGFR  Date Value Ref Range Status  11/25/2022 112 >59 mL/min/1.73 Final         Passed - Valid encounter within last 6 months    Recent Outpatient Visits           3 weeks ago Fibromyalgia   [place] [facility] [place], Megan P, DO   3 months ago Left sided sciatica   [place] [facility] [place], Megan P, DO   3 months ago Encounter for removal and reinsertion of intrauterine contraceptive device   [place] [facility] Jackolyn Confer, MD   4 months ago Menorrhagia due to intrauterine device (IUD) [facility])   [place] [facility] Jackolyn Confer, MD   4 months ago Left sided sciatica   [place] [facility] [place], [place], DO       Future Appointments             Tomorrow Dorcas Carrow, DO [place] [facility]             pregabalin (LYRICA) 100 MG capsule [Pharmacy Med  Name: PREGABALIN 100 MG CAPSULE] 120 capsule     Sig: TAKE 1 CAPSULE BY MOUTH EVERY MORNING AND TAKE ONE CAPSULE BY MOUTH EVERY EVENING AND TAKE TWO CAPSULES BY MOUTH EVERY NIGHT AT BEDTIME , TO BE TAKEN WITH 50MG  TABLETS IN THE IN THE MORNING AND IN THE EVENING     Not Delegated - Neurology:  Anticonvulsants - Controlled - pregabalin Failed - 05/27/2023 10:30 AM      Failed - This refill cannot be delegated      Passed - Cr in normal range and within 360 days    Creatinine, Ser  Date Value Ref Range Status  11/25/2022 0.65 0.57 - 1.00 mg/dL Final         Passed - Completed PHQ-2 or PHQ-9 in the last 360 days      Passed - Valid encounter within last 12 months    Recent Outpatient Visits           3 weeks ago Fibromyalgia   [place] [facility] [place], Megan P, DO   3 months ago Left sided sciatica   [place] [facility] [place], Megan P, DO   3 months ago Encounter for removal and reinsertion of intrauterine contraceptive device   [place] [facility] Jackolyn Confer, MD   4 months ago Menorrhagia due to intrauterine device (IUD) [facility])   [place] [facility] Jackolyn Confer, MD   4 months ago Left sided sciatica   [place] [facility] [place], Oralia Rud, DO       Future Appointments             Tomorrow Dorcas Carrow, DO [place] [facility], [facility]

## 2023-05-28 ENCOUNTER — Encounter: Payer: No Typology Code available for payment source | Admitting: Physical Therapy

## 2023-05-28 ENCOUNTER — Telehealth: Payer: No Typology Code available for payment source | Admitting: Family Medicine

## 2023-05-28 ENCOUNTER — Encounter: Payer: Self-pay | Admitting: Family Medicine

## 2023-05-28 DIAGNOSIS — F322 Major depressive disorder, single episode, severe without psychotic features: Secondary | ICD-10-CM | POA: Diagnosis not present

## 2023-05-28 DIAGNOSIS — E782 Mixed hyperlipidemia: Secondary | ICD-10-CM | POA: Diagnosis not present

## 2023-05-28 DIAGNOSIS — M797 Fibromyalgia: Secondary | ICD-10-CM

## 2023-05-28 DIAGNOSIS — R7301 Impaired fasting glucose: Secondary | ICD-10-CM | POA: Diagnosis not present

## 2023-05-28 DIAGNOSIS — G43909 Migraine, unspecified, not intractable, without status migrainosus: Secondary | ICD-10-CM

## 2023-05-28 MED ORDER — PREGABALIN 100 MG PO CAPS: ORAL_CAPSULE | ORAL | 5 refills | Status: DC

## 2023-05-28 MED ORDER — PREGABALIN 50 MG PO CAPS: 50.0000 mg | ORAL_CAPSULE | Freq: Two times a day (BID) | ORAL | 5 refills | Status: DC

## 2023-05-28 MED ORDER — FAMOTIDINE 20 MG PO TABS
20.0000 mg | ORAL_TABLET | Freq: Two times a day (BID) | ORAL | 1 refills | Status: DC
Start: 2023-05-28 — End: 2023-12-08

## 2023-05-28 MED ORDER — PREDNISONE 10 MG PO TABS: ORAL_TABLET | ORAL | 0 refills | Status: DC

## 2023-05-28 MED ORDER — METHOCARBAMOL 500 MG PO TABS: 500.0000 mg | ORAL_TABLET | Freq: Three times a day (TID) | ORAL | 3 refills | Status: DC | PRN

## 2023-05-28 MED ORDER — SUMATRIPTAN SUCCINATE 100 MG PO TABS: 100.0000 mg | ORAL_TABLET | Freq: Once | ORAL | 12 refills | Status: DC

## 2023-05-28 MED ORDER — ALBUTEROL SULFATE HFA 108 (90 BASE) MCG/ACT IN AERS: 2.0000 | INHALATION_SPRAY | Freq: Four times a day (QID) | RESPIRATORY_TRACT | 6 refills | Status: DC | PRN

## 2023-05-28 NOTE — Progress Notes (Unsigned)
 There were no vitals taken for this visit.   Subjective:    Patient ID: Sydney Valenzuela, female    DOB: 07/22/1980, 43 y.o.   MRN: [id number]  HPI: Sydney Valenzuela is a 43 y.o. female  Chief Complaint  Patient presents with  . Anxiety  . Insomnia   She notes that she is not feeling any better. She notes that she has had a migraine for over a week. She notes that it was really bad when she had the flu  She has been using sinus rinses, OTC medications, has been on doxycycline. She has tried a sumatriptan  HYPERLIPIDEMIA Hyperlipidemia status: {Blank single:19197::"excellent compliance","good compliance","fair compliance","poor compliance"} Satisfied with current treatment?  {Blank single:19197::"yes","no"} Side effects:  {Blank single:19197::"yes","no"} Medication compliance: {Blank single:19197::"excellent compliance","good compliance","fair compliance","poor compliance"} Past cholesterol meds: {Blank multiple:19196::"none","atorvastain (lipitor)","lovastatin (mevacor)","pravastatin (pravachol)","rosuvastatin (crestor)","simvastatin (zocor)","vytorin","fenofibrate (tricor)","gemfibrozil","ezetimide (zetia)","niaspan","lovaza"} Supplements: {Blank multiple:19196::"none","fish oil","niacin","red yeast rice"} Aspirin:  {Blank single:19197::"yes","no"} The 10-year ASCVD risk score (Arnett DK, et al., 2019) is: 0.5%   Values used to calculate the score:     Age: 74 years     Sex: Female     Is Non-Hispanic African American: No     Diabetic: No     Tobacco smoker: No     Systolic Blood Pressure: 129 mmHg     Is BP treated: No     HDL Cholesterol: 82 mg/dL     Total Cholesterol: 244 mg/dL Chest pain:  {Blank KGURKY:70623::"JSE","GB"} Coronary artery disease:  {Blank single:19197::"yes","no"} Family history CAD:  {Blank single:19197::"yes","no"} Family history early CAD:  {Blank single:19197::"yes","no"}  Impaired Fasting Glucose HbA1C:  Lab Results  Component Value Date   HGBA1C  5.7 (H) 11/25/2022   Duration of elevated blood sugar:  Polydipsia: {Blank single:19197::"yes","no"} Polyuria: {Blank single:19197::"yes","no"} Weight change: {Blank single:19197::"yes","no"} Visual disturbance: {Blank single:19197::"yes","no"} Glucose Monitoring: {Blank single:19197::"yes","no"}    Accucheck frequency: {Blank single:19197::"Not Checking","Daily","BID","TID"}    Fasting glucose:     Post prandial:  Diabetic Education: {Blank single:19197::"Completed","Not Completed"} Family history of diabetes: {Blank single:19197::"yes","no"}   Relevant past medical, surgical, family and social history reviewed and updated as indicated. Interim medical history since our last visit reviewed. Allergies and medications reviewed and updated.  Review of Systems  Constitutional: Negative.   HENT: Negative.    Respiratory: Negative.    Cardiovascular: Negative.   Gastrointestinal: Negative.   Musculoskeletal:  Positive for arthralgias, back pain and myalgias. Negative for gait problem, joint swelling, neck pain and neck stiffness.  Skin: Negative.   Neurological:  Positive for headaches. Negative for dizziness, tremors, seizures, syncope, facial asymmetry, speech difficulty, weakness, light-headedness and numbness.  Psychiatric/Behavioral:  Positive for dysphoric mood and sleep disturbance. Negative for agitation, behavioral problems, confusion, decreased concentration, hallucinations, self-injury and suicidal ideas. The patient is nervous/anxious. The patient is not hyperactive.     Per HPI unless specifically indicated above     Objective:    There were no vitals taken for this visit.  Wt Readings from Last 3 Encounters:  02/25/23 242 lb 8 oz (110 kg)  12/30/22 231 lb 9.6 oz (105.1 kg)  11/25/22 236 lb 9.6 oz (107.3 kg)    Physical Exam Vitals and nursing note reviewed.  Constitutional:      General: She is not in acute distress.    Appearance: Normal appearance. She is  obese. She is ill-appearing. She is not toxic-appearing or diaphoretic.  HENT:     Head: Normocephalic and atraumatic.     Right Ear: External ear normal.  Left Ear: External ear normal.     Nose: Nose normal.     Mouth/Throat:     Mouth: Mucous membranes are moist.     Pharynx: Oropharynx is clear.  Eyes:     General: No scleral icterus.       Right eye: No discharge.        Left eye: No discharge.     Conjunctiva/sclera: Conjunctivae normal.     Pupils: Pupils are equal, round, and reactive to light.  Pulmonary:     Effort: Pulmonary effort is normal. No respiratory distress.     Comments: Speaking in full sentences Musculoskeletal:        General: Normal range of motion.     Cervical back: Normal range of motion.  Skin:    Coloration: Skin is not jaundiced or pale.     Findings: No bruising, erythema, lesion or rash.  Neurological:     Mental Status: She is alert and oriented to person, place, and time. Mental status is at baseline.  Psychiatric:        Mood and Affect: Mood normal.        Behavior: Behavior normal.        Thought Content: Thought content normal.        Judgment: Judgment normal.    Results for orders placed or performed in visit on 01/28/23  Pregnancy, urine   Collection Time: 01/28/23  3:48 PM  Result Value Ref Range   Preg Test, Ur Negative Negative      Assessment & Plan:   Problem List Items Addressed This Visit       Other   Depression, major, single episode, severe (HCC) - Primary   Fibromyalgia     Follow up plan: No follow-ups on file.   This visit was completed via video visit through MyChart due to the restrictions of the COVID-19 pandemic. All issues as above were discussed and addressed. Physical exam was done as above through visual confirmation on video through MyChart. If it was felt that the patient should be evaluated in the office, they were directed there. The patient verbally consented to this visit. Location of the  patient: home Location of the provider: work Those involved with this call:  Provider: Olevia Perches, DO CMA:  Arnetha Gula, CMA Front Desk/Registration: Servando Snare  Time spent on call:  25 minutes with patient face to face via video conference. More than 50% of this time was spent in counseling and coordination of care. 40 minutes total spent in review of patient's record and preparation of their chart.

## 2023-05-29 ENCOUNTER — Encounter: Payer: Self-pay | Admitting: Family Medicine

## 2023-05-29 DIAGNOSIS — E782 Mixed hyperlipidemia: Secondary | ICD-10-CM | POA: Insufficient documentation

## 2023-05-29 DIAGNOSIS — R7301 Impaired fasting glucose: Secondary | ICD-10-CM | POA: Insufficient documentation

## 2023-05-29 NOTE — Assessment & Plan Note (Signed)
 In exacerbation. Offered toradol shot, but she declined today. Will get her started on imitrex. Call with any concerns or if not getting better.

## 2023-05-29 NOTE — Assessment & Plan Note (Signed)
 In a flare. Will treat with prednisone and continue her lyrica. Call with any concerns. Continue to monitor.

## 2023-05-29 NOTE — Assessment & Plan Note (Signed)
 Not doing great, but stable. Continue to follow with psychiatry. Call with any concerns.

## 2023-05-29 NOTE — Assessment & Plan Note (Signed)
 Will recheck labs and treat as needed. Await results.

## 2023-06-02 ENCOUNTER — Encounter: Payer: No Typology Code available for payment source | Admitting: Physical Therapy

## 2023-06-02 ENCOUNTER — Ambulatory Visit: Payer: No Typology Code available for payment source | Admitting: Physical Therapy

## 2023-06-04 ENCOUNTER — Encounter: Payer: No Typology Code available for payment source | Admitting: Physical Therapy

## 2023-06-05 ENCOUNTER — Encounter: Payer: No Typology Code available for payment source | Admitting: Physical Therapy

## 2023-06-09 ENCOUNTER — Encounter: Payer: No Typology Code available for payment source | Admitting: Physical Therapy

## 2023-06-12 ENCOUNTER — Encounter: Payer: No Typology Code available for payment source | Admitting: Physical Therapy

## 2023-06-17 ENCOUNTER — Encounter: Payer: No Typology Code available for payment source | Admitting: Physical Therapy

## 2023-06-17 ENCOUNTER — Ambulatory Visit: Payer: Self-pay | Admitting: Family Medicine

## 2023-06-17 NOTE — Telephone Encounter (Signed)
 Copied from CRM [id number]. Topic: Clinical - Red Word Triage >> Jun 17, 2023  8:20 AM Gery Pray wrote: Red Word that prompted transfer to Nurse Triage: Patient called in with headache that is causing her face to hurt, down in her teeth hurt, and ears to hurt. Patient is also experiencing body aches, chest congestion, and coughing.  Chief Complaint: headache, sinus pain, pressure, teeth pain, ear pain, congestion and cough Symptoms: see above Frequency: constant Pertinent Negatives: Patient denies head injury, cp, sob Disposition: [] ED /[] Urgent Care (no appt availability in office) / [x] Appointment(In office/virtual)/ []  [facility]/ [] Home Care/ [] Refused Recommended Disposition /[] [facility]/ []  Follow-up with PCP Additional Notes: apt made; care advice given, denies questions; instructed to go to ER if becomes worse.   Reason for Disposition  [1] SEVERE headache (e.g., excruciating) AND [2] not improved after 2 hours of pain medicine  Answer Assessment - Initial Assessment Questions 1. LOCATION: "Where does it hurt?"      Head, face, teeth, and ears 2. ONSET: "When did the headache start?" (Minutes, hours or days)      Started when she had the flu 3. PATTERN: "Does the pain come and go, or has it been constant since it started?"     constant 4. SEVERITY: "How bad is the pain?" and "What does it keep you from doing?"  (e.g., Scale 1-10; mild, moderate, or severe)   - MILD (1-3): doesn't interfere with normal activities    - MODERATE (4-7): interferes with normal activities or awakens from sleep    - SEVERE (8-10): excruciating pain, unable to do any normal activities        4-5/10 but does get worse 5. RECURRENT SYMPTOM: "Have you ever had headaches before?" If Yes, ask: "When was the last time?" and "What happened that time?"      Yes, migraine 6. CAUSE: "What do you think is causing the headache?"     sinus 7. MIGRAINE: "Have you been diagnosed with migraine  headaches?" If Yes, ask: "Is this headache similar?"      Yes,  8. HEAD INJURY: "Has there been any recent injury to the head?"      na 9. OTHER SYMPTOMS: "Do you have any other symptoms?" (fever, stiff neck, eye pain, sore throat, cold symptoms)     Facial pain, teeth pain, ear pain, congestion, coughing 10. PREGNANCY: "Is there any chance you are pregnant?" "When was your last menstrual period?"       na  Protocols used: [facility]

## 2023-06-19 ENCOUNTER — Encounter: Payer: Self-pay | Admitting: Family Medicine

## 2023-06-19 ENCOUNTER — Encounter: Payer: No Typology Code available for payment source | Admitting: Physical Therapy

## 2023-06-19 ENCOUNTER — Ambulatory Visit (INDEPENDENT_AMBULATORY_CARE_PROVIDER_SITE_OTHER): Admitting: Family Medicine

## 2023-06-19 DIAGNOSIS — H66001 Acute suppurative otitis media without spontaneous rupture of ear drum, right ear: Secondary | ICD-10-CM | POA: Diagnosis not present

## 2023-06-19 MED ORDER — PREDNISONE 10 MG PO TABS: ORAL_TABLET | ORAL | 0 refills | Status: DC

## 2023-06-19 MED ORDER — AMOXICILLIN-POT CLAVULANATE 875-125 MG PO TABS: 1.0000 | ORAL_TABLET | Freq: Two times a day (BID) | ORAL | 0 refills | Status: DC

## 2023-06-19 NOTE — Progress Notes (Signed)
 There were no vitals taken for this visit.   Subjective:    Patient ID: Sydney Valenzuela, female    DOB: Dec 28, 1980, 43 y.o.   MRN: [id number]  HPI: Sydney Valenzuela is a 43 y.o. female  Chief Complaint  Patient presents with   URI    Still ongoing symptoms. Migraines remain, sinus pressure, sore throat, facial swelling, post nasal drip. Body aches no energy.    UPPER RESPIRATORY TRACT INFECTION Duration: Was feeling better for a couple of days and then started feeling worse again, x2 weeks Worst symptom: congestion and face pain Fever: + chills and sweats Cough: yes Shortness of breath: no Wheezing: no Chest pain: no Chest tightness: yes Chest congestion: yes Nasal congestion: yes Runny nose: yes Post nasal drip: no Sneezing: no Sore throat: no Swollen glands: no Sinus pressure: yes Headache: yes Face pain: yes Toothache: yes Ear pain: yes "right Ear pressure: yes bilateral Eyes red/itching:no Eye drainage/crusting: yes  Vomiting: no Rash: no Fatigue: yes Sick contacts: no Strep contacts: no  Context: worse Recurrent sinusitis: no Relief with OTC cold/cough medications: no  Treatments attempted: tylenol   Relevant past medical, surgical, family and social history reviewed and updated as indicated. Interim medical history since our last visit reviewed. Allergies and medications reviewed and updated.  Review of Systems  Constitutional:  Positive for chills, diaphoresis and fatigue. Negative for activity change, appetite change, fever and unexpected weight change.  HENT:  Positive for congestion, ear pain, postnasal drip, rhinorrhea, sinus pressure and sinus pain. Negative for dental problem, drooling, ear discharge, facial swelling, hearing loss, mouth sores, nosebleeds, sneezing, sore throat, tinnitus, trouble swallowing and voice change.   Eyes: Negative.   Respiratory: Negative.    Cardiovascular: Negative.   Gastrointestinal: Negative.    Psychiatric/Behavioral: Negative.      Per HPI unless specifically indicated above     Objective:    There were no vitals taken for this visit.  Wt Readings from Last 3 Encounters:  02/25/23 242 lb 8 oz (110 kg)  12/30/22 231 lb 9.6 oz (105.1 kg)  11/25/22 236 lb 9.6 oz (107.3 kg)    Physical Exam Vitals and nursing note reviewed.  Constitutional:      General: She is not in acute distress.    Appearance: Normal appearance. She is not ill-appearing, toxic-appearing or diaphoretic.  HENT:     Head: Normocephalic and atraumatic.     Right Ear: Ear canal and external ear normal. There is no impacted cerumen. Tympanic membrane is erythematous and bulging.     Left Ear: Tympanic membrane, ear canal and external ear normal. There is no impacted cerumen.     Nose: Nose normal.     Mouth/Throat:     Mouth: Mucous membranes are moist.     Pharynx: Oropharynx is clear.  Eyes:     General: No scleral icterus.       Right eye: No discharge.        Left eye: No discharge.     Extraocular Movements: Extraocular movements intact.     Conjunctiva/sclera: Conjunctivae normal.     Pupils: Pupils are equal, round, and reactive to light.  Cardiovascular:     Rate and Rhythm: Normal rate and regular rhythm.     Pulses: Normal pulses.     Heart sounds: Normal heart sounds. No murmur heard.    No friction rub. No gallop.  Pulmonary:     Effort: Pulmonary effort is normal. No respiratory distress.  Breath sounds: Normal breath sounds. No stridor. No wheezing, rhonchi or rales.  Chest:     Chest wall: No tenderness.  Musculoskeletal:        General: Normal range of motion.     Cervical back: Normal range of motion and neck supple.  Skin:    General: Skin is warm and dry.     Capillary Refill: Capillary refill takes less than 2 seconds.     Coloration: Skin is not jaundiced or pale.     Findings: No bruising, erythema, lesion or rash.  Neurological:     General: No focal deficit  present.     Mental Status: She is alert and oriented to person, place, and time. Mental status is at baseline.  Psychiatric:        Mood and Affect: Mood normal.        Behavior: Behavior normal.        Thought Content: Thought content normal.        Judgment: Judgment normal.     Results for orders placed or performed in visit on 01/28/23  Pregnancy, urine   Collection Time: 01/28/23  3:48 PM  Result Value Ref Range   Preg Test, Ur Negative Negative      Assessment & Plan:   Problem List Items Addressed This Visit   None Visit Diagnoses       Non-recurrent acute suppurative otitis media of right ear without spontaneous rupture of tympanic membrane    -  Primary   Will treat with augmentin and steroid taper. Recheck in about 2 weeks as scheduled. Call with any concerns.   Relevant Medications   amoxicillin-clavulanate (AUGMENTIN) 875-125 MG tablet        Follow up plan: Return for As scheduled.

## 2023-06-24 ENCOUNTER — Encounter: Payer: No Typology Code available for payment source | Admitting: Physical Therapy

## 2023-06-24 ENCOUNTER — Other Ambulatory Visit: Payer: Self-pay

## 2023-06-24 MED ORDER — BACLOFEN 10 MG PO TABS: 10.0000 mg | ORAL_TABLET | ORAL | 1 refills | Status: DC | PRN

## 2023-06-26 ENCOUNTER — Encounter: Payer: No Typology Code available for payment source | Admitting: Physical Therapy

## 2023-06-30 ENCOUNTER — Ambulatory Visit: Admitting: Family Medicine

## 2023-07-01 ENCOUNTER — Ambulatory Visit (INDEPENDENT_AMBULATORY_CARE_PROVIDER_SITE_OTHER): Admitting: Family Medicine

## 2023-07-01 ENCOUNTER — Encounter: Payer: No Typology Code available for payment source | Admitting: Physical Therapy

## 2023-07-01 ENCOUNTER — Encounter: Payer: Self-pay | Admitting: Family Medicine

## 2023-07-01 VITALS — BP 148/90 | HR 73 | Temp 98.3°F | Wt 237.8 lb

## 2023-07-01 DIAGNOSIS — F322 Major depressive disorder, single episode, severe without psychotic features: Secondary | ICD-10-CM

## 2023-07-01 DIAGNOSIS — B850 Pediculosis due to Pediculus humanus capitis: Secondary | ICD-10-CM

## 2023-07-01 DIAGNOSIS — R7301 Impaired fasting glucose: Secondary | ICD-10-CM | POA: Diagnosis not present

## 2023-07-01 DIAGNOSIS — E782 Mixed hyperlipidemia: Secondary | ICD-10-CM

## 2023-07-01 DIAGNOSIS — M797 Fibromyalgia: Secondary | ICD-10-CM

## 2023-07-01 DIAGNOSIS — H66001 Acute suppurative otitis media without spontaneous rupture of ear drum, right ear: Secondary | ICD-10-CM

## 2023-07-01 MED ORDER — TIZANIDINE HCL 4 MG PO TABS: 4.0000 mg | ORAL_TABLET | Freq: Four times a day (QID) | ORAL | 3 refills | Status: DC | PRN

## 2023-07-01 MED ORDER — AMOXICILLIN-POT CLAVULANATE 875-125 MG PO TABS: 1.0000 | ORAL_TABLET | Freq: Two times a day (BID) | ORAL | 0 refills | Status: DC

## 2023-07-01 NOTE — Progress Notes (Unsigned)
 BP (!) 148/90   Pulse 73   Temp 98.3 F (36.8 C)   Wt 237 lb 12.8 oz (107.9 kg)   BMI 42.12 kg/m    Subjective:    Patient ID: Sydney Valenzuela, female    DOB: October 01, 1980, 43 y.o.   MRN: [id number]  HPI: Sydney Valenzuela is a 43 y.o. female  Chief Complaint  Patient presents with  . Sinus Problem   UPPER RESPIRATORY TRACT INFECTION Worst symptom: Fever: {Blank single:19197::"yes","no"} Cough: {Blank single:19197::"yes","no"} Shortness of breath: {Blank single:19197::"yes","no"} Wheezing: {Blank single:19197::"yes","no"} Chest pain: {Blank single:19197::"yes","no","yes, with cough"} Chest tightness: {Blank single:19197::"yes","no"} Chest congestion: {Blank single:19197::"yes","no"} Nasal congestion: {Blank single:19197::"yes","no"} Runny nose: {Blank single:19197::"yes","no"} Post nasal drip: {Blank single:19197::"yes","no"} Sneezing: {Blank single:19197::"yes","no"} Sore throat: {Blank single:19197::"yes","no"} Swollen glands: {Blank single:19197::"yes","no"} Sinus pressure: {Blank single:19197::"yes","no"} Headache: {Blank single:19197::"yes","no"} Face pain: {Blank single:19197::"yes","no"} Toothache: {Blank single:19197::"yes","no"} Ear pain: {Blank single:19197::"yes","no"} {Blank single:19197::""right","left", "bilateral"} Ear pressure: {Blank single:19197::"yes","no"} {Blank single:19197::""right","left", "bilateral"} Eyes red/itching:{Blank single:19197::"yes","no"} Eye drainage/crusting: {Blank single:19197::"yes","no"}  Vomiting: {Blank single:19197::"yes","no"} Rash: {Blank single:19197::"yes","no"} Fatigue: {Blank single:19197::"yes","no"} Sick contacts: {Blank single:19197::"yes","no"} Strep contacts: {Blank single:19197::"yes","no"}  Context: {Blank multiple:19196::"better","worse","stable","fluctuating"} Recurrent sinusitis: {Blank single:19197::"yes","no"} Relief with OTC cold/cough medications: {Blank single:19197::"yes","no"}  Treatments attempted:  {Blank multiple:19196::"none","cold/sinus","mucinex","anti-histamine","pseudoephedrine","cough syrup","antibiotics"}    Impaired Fasting Glucose HbA1C:  Lab Results  Component Value Date   HGBA1C 5.7 (H) 11/25/2022   Duration of elevated blood sugar:  Polydipsia: {Blank single:19197::"yes","no"} Polyuria: {Blank single:19197::"yes","no"} Weight change: {Blank single:19197::"yes","no"} Visual disturbance: {Blank single:19197::"yes","no"} Glucose Monitoring: {Blank single:19197::"yes","no"}    Accucheck frequency: {Blank single:19197::"Not Checking","Daily","BID","TID"}    Fasting glucose:     Post prandial:  Diabetic Education: {Blank single:19197::"Completed","Not Completed"} Family history of diabetes: {Blank single:19197::"yes","no"}  HYPERLIPIDEMIA Hyperlipidemia status: {Blank single:19197::"excellent compliance","good compliance","fair compliance","poor compliance"} Satisfied with current treatment?  {Blank single:19197::"yes","no"} Side effects:  {Blank single:19197::"yes","no"} Medication compliance: {Blank single:19197::"excellent compliance","good compliance","fair compliance","poor compliance"} Past cholesterol meds: {Blank multiple:19196::"none","atorvastain (lipitor)","lovastatin (mevacor)","pravastatin (pravachol)","rosuvastatin (crestor)","simvastatin (zocor)","vytorin","fenofibrate (tricor)","gemfibrozil","ezetimide (zetia)","niaspan","lovaza"} Supplements: {Blank multiple:19196::"none","fish oil","niacin","red yeast rice"} Aspirin:  {Blank single:19197::"yes","no"} The 10-year ASCVD risk score (Arnett DK, et al., 2019) is: 0.7%   Values used to calculate the score:     Age: 44 years     Sex: Female     Is Non-Hispanic African American: No     Diabetic: No     Tobacco smoker: No     Systolic Blood Pressure: 148 mmHg     Is BP treated: No     HDL Cholesterol: 82 mg/dL     Total Cholesterol: 244 mg/dL Chest pain:  {Blank WNUUVO:53664::"QIH","KV"} Coronary artery  disease:  {Blank single:19197::"yes","no"} Family history CAD:  {Blank single:19197::"yes","no"} Family history early CAD:  {Blank single:19197::"yes","no"}  ANXIETY/STRESS Duration:{Blank single:19197::"controlled","uncontrolled","better","worse","exacerbated","stable"} Anxious mood: {Blank single:19197::"yes","no"}  Excessive worrying: {Blank single:19197::"yes","no"} Irritability: {Blank single:19197::"yes","no"}  Sweating: {Blank single:19197::"yes","no"} Nausea: {Blank single:19197::"yes","no"} Palpitations:{Blank single:19197::"yes","no"} Hyperventilation: {Blank single:19197::"yes","no"} Panic attacks: {Blank single:19197::"yes","no"} Agoraphobia: {Blank single:19197::"yes","no"}  Obscessions/compulsions: {Blank single:19197::"yes","no"} Depressed mood: {Blank single:19197::"yes","no"}    06/19/2023    9:19 AM 05/28/2023   11:37 AM 05/21/2023   10:27 AM 05/20/2023    9:30 AM 05/09/2023    8:52 AM  Depression screen PHQ 2/9  Decreased Interest 3 2 3 3 3   Down, Depressed, Hopeless 3 2 3 3 3   PHQ - 2 Score 6 4 6 6 6   Altered sleeping 3 3 3 3 3   Tired, decreased energy 3 3 3 3 3   Change in appetite 3 3 3 2 3   Feeling bad or failure about yourself  3 3 3 3 3   Trouble concentrating 3  3 3 3 3   Moving slowly or fidgety/restless 3 0 2 2 3   Suicidal thoughts 1 1 0 1 1  PHQ-9 Score 25 20 23 23 25   Difficult doing work/chores  Extremely dIfficult Extremely dIfficult Extremely dIfficult Extremely dIfficult   Anhedonia: {Blank single:19197::"yes","no"} Weight changes: {Blank single:19197::"yes","no"} Insomnia: {Blank single:19197::"yes","no"} {Blank single:19197::"hard to fall asleep","hard to stay asleep"}  Hypersomnia: {Blank single:19197::"yes","no"} Fatigue/loss of energy: {Blank single:19197::"yes","no"} Feelings of worthlessness: {Blank single:19197::"yes","no"} Feelings of guilt: {Blank single:19197::"yes","no"} Impaired concentration/indecisiveness: {Blank  single:19197::"yes","no"} Suicidal ideations: {Blank single:19197::"yes","no"}  Crying spells: {Blank single:19197::"yes","no"} Recent Stressors/Life Changes: {Blank single:19197::"yes","no"}   Relationship problems: {Blank single:19197::"yes","no"}   Family stress: {Blank single:19197::"yes","no"}     Financial stress: {Blank single:19197::"yes","no"}    Job stress: {Blank single:19197::"yes","no"}    Recent death/loss: {Blank single:19197::"yes","no"}  FIBROMYALGIA Pain status: {Blank single:19197::"controlled","uncontrolled","better","worse","exacerbated","stable"} Satisfied with current treatment?: {Blank single:19197::"yes","no"} Medication side effects: {Blank single:19197::"yes","no"} Medication compliance: {Blank single:19197::"excellent compliance","good compliance","fair compliance","poor compliance"} Duration:  Location:  Quality: {Blank multiple:19196::"sharp","dull","aching","burning","cramping","ill-defined","itchy","pressure-like","pulling","shooting","sore","stabbing","tender","tearing","throbbing"} Current pain level: {Blank single:19197::"mild","moderate","severe","1/10","2/10","3/10","4/10","5/10","6/10","7/10","8/10","9/10","10/10"} Previous pain level: {Blank single:19197::"mild","moderate","severe","1/10","2/10","3/10","4/10","5/10","6/10","7/10","8/10","9/10","10/10"} Aggravating factors: {Blank multiple:19196::"none","lifting","movement","walking","laying","bending","prolonged sitting","coughing","valsalva","Pain increased with coughing/valsalva"} Alleviating factors: {Blank multiple:19196::"nothing","rest","ice","heat","laying","NSAIDs","APAP","narcotics","muscle relaxer"} Previous pain specialty evaluation: {Blank single:19197::"yes","no"} Non-narcotic analgesic meds: {Blank single:19197::"yes","no"} Narcotic contract:{Blank single:19197::"yes","no"} Treatments attempted: {Blank multiple:19196::"none","rest","ice","heat","APAP","ibuprofen","aleve","physical  therapy","HEP","OMM"}    Relevant past medical, surgical, family and social history reviewed and updated as indicated. Interim medical history since our last visit reviewed. Allergies and medications reviewed and updated.  Review of Systems  Constitutional: Negative.   Respiratory: Negative.    Cardiovascular: Negative.   Gastrointestinal: Negative.   Musculoskeletal:  Positive for arthralgias, back pain and myalgias. Negative for gait problem, joint swelling, neck pain and neck stiffness.  Skin: Negative.   Neurological: Negative.   Psychiatric/Behavioral:  Positive for dysphoric mood. Negative for agitation, behavioral problems, confusion, decreased concentration, hallucinations, self-injury, sleep disturbance and suicidal ideas. The patient is nervous/anxious. The patient is not hyperactive.     Per HPI unless specifically indicated above     Objective:    BP (!) 148/90   Pulse 73   Temp 98.3 F (36.8 C)   Wt 237 lb 12.8 oz (107.9 kg)   BMI 42.12 kg/m   Wt Readings from Last 3 Encounters:  07/01/23 237 lb 12.8 oz (107.9 kg)  02/25/23 242 lb 8 oz (110 kg)  12/30/22 231 lb 9.6 oz (105.1 kg)    Physical Exam  Results for orders placed or performed in visit on 01/28/23  Pregnancy, urine   Collection Time: 01/28/23  3:48 PM  Result Value Ref Range   Preg Test, Ur Negative Negative      Assessment & Plan:   Problem List Items Addressed This Visit       Endocrine   IFG (impaired fasting glucose)     Other   Depression, major, single episode, severe (HCC)   Fibromyalgia   Mixed hyperlipidemia     Follow up plan: No follow-ups on file.

## 2023-07-02 ENCOUNTER — Encounter: Payer: Self-pay | Admitting: Family Medicine

## 2023-07-02 LAB — CBC WITH DIFFERENTIAL/PLATELET
Basophils Absolute: 0.1 10*3/uL (ref 0.0–0.2)
Basos: 1 %
EOS (ABSOLUTE): 0.1 10*3/uL (ref 0.0–0.4)
Eos: 1 %
Hematocrit: 39.3 % (ref 34.0–46.6)
Hemoglobin: 13.2 g/dL (ref 11.1–15.9)
Immature Grans (Abs): 0.1 10*3/uL (ref 0.0–0.1)
Immature Granulocytes: 1 %
Lymphocytes Absolute: 2 10*3/uL (ref 0.7–3.1)
Lymphs: 21 %
MCH: 30.4 pg (ref 26.6–33.0)
MCHC: 33.6 g/dL (ref 31.5–35.7)
MCV: 91 fL (ref 79–97)
Monocytes Absolute: 0.5 10*3/uL (ref 0.1–0.9)
Monocytes: 5 %
Neutrophils Absolute: 6.7 10*3/uL (ref 1.4–7.0)
Neutrophils: 71 %
Platelets: 307 10*3/uL (ref 150–450)
RBC: 4.34 x10E6/uL (ref 3.77–5.28)
RDW: 13.1 % (ref 11.7–15.4)
WBC: 9.3 10*3/uL (ref 3.4–10.8)

## 2023-07-02 LAB — COMPREHENSIVE METABOLIC PANEL WITH GFR
ALT: 15 IU/L (ref 0–32)
AST: 15 IU/L (ref 0–40)
Albumin: 4 g/dL (ref 3.9–4.9)
Alkaline Phosphatase: 80 IU/L (ref 44–121)
BUN/Creatinine Ratio: 21 (ref 9–23)
BUN: 12 mg/dL (ref 6–24)
Bilirubin Total: 0.3 mg/dL (ref 0.0–1.2)
CO2: 21 mmol/L (ref 20–29)
Calcium: 9.3 mg/dL (ref 8.7–10.2)
Chloride: 100 mmol/L (ref 96–106)
Creatinine, Ser: 0.58 mg/dL (ref 0.57–1.00)
Globulin, Total: 2.1 g/dL (ref 1.5–4.5)
Glucose: 109 mg/dL — ABNORMAL HIGH (ref 70–99)
Potassium: 3.9 mmol/L (ref 3.5–5.2)
Sodium: 138 mmol/L (ref 134–144)
Total Protein: 6.1 g/dL (ref 6.0–8.5)
eGFR: 115 mL/min/{1.73_m2} (ref >59)

## 2023-07-02 LAB — BAYER DCA HB A1C WAIVED: HB A1C (BAYER DCA - WAIVED): 5.5 % (ref 4.8–5.6)

## 2023-07-02 LAB — LIPID PANEL W/O CHOL/HDL RATIO
Cholesterol, Total: 253 mg/dL — ABNORMAL HIGH (ref 100–199)
HDL: 67 mg/dL (ref >39)
LDL Chol Calc (NIH): 99 mg/dL (ref 0–99)
Triglycerides: 524 mg/dL — ABNORMAL HIGH (ref 0–149)
VLDL Cholesterol Cal: 87 mg/dL — ABNORMAL HIGH (ref 5–40)

## 2023-07-03 ENCOUNTER — Encounter: Payer: No Typology Code available for payment source | Admitting: Physical Therapy

## 2023-07-03 MED ORDER — SPINOSAD 0.9 % EX SUSP: CUTANEOUS | 0 refills | Status: DC

## 2023-07-03 NOTE — Assessment & Plan Note (Signed)
 Under good control on current regimen. Continue current regimen. Continue to monitor. Call with any concerns. Refills given. Labs drawn today.

## 2023-07-03 NOTE — Assessment & Plan Note (Signed)
 Rechecking labs today. Await results. Treat as needed.

## 2023-07-03 NOTE — Assessment & Plan Note (Signed)
 Stable. Not doing great. Continue to follow with psychiatry. Call with any concerns.

## 2023-07-04 ENCOUNTER — Encounter: Payer: Self-pay | Admitting: Family Medicine

## 2023-07-08 ENCOUNTER — Encounter: Payer: No Typology Code available for payment source | Admitting: Physical Therapy

## 2023-07-14 ENCOUNTER — Encounter: Payer: No Typology Code available for payment source | Admitting: Physical Therapy

## 2023-07-17 ENCOUNTER — Encounter: Payer: No Typology Code available for payment source | Admitting: Physical Therapy

## 2023-07-21 ENCOUNTER — Encounter: Payer: No Typology Code available for payment source | Admitting: Physical Therapy

## 2023-07-23 ENCOUNTER — Encounter: Payer: No Typology Code available for payment source | Admitting: Physical Therapy

## 2023-07-24 ENCOUNTER — Encounter: Payer: Self-pay | Admitting: Family Medicine

## 2023-07-24 ENCOUNTER — Ambulatory Visit (INDEPENDENT_AMBULATORY_CARE_PROVIDER_SITE_OTHER): Admitting: Family Medicine

## 2023-07-24 VITALS — BP 138/89 | HR 72 | Ht 63.0 in | Wt 236.8 lb

## 2023-07-24 DIAGNOSIS — J019 Acute sinusitis, unspecified: Secondary | ICD-10-CM | POA: Diagnosis not present

## 2023-07-24 DIAGNOSIS — B9689 Other specified bacterial agents as the cause of diseases classified elsewhere: Secondary | ICD-10-CM

## 2023-07-24 MED ORDER — TRIAMCINOLONE ACETONIDE 40 MG/ML IJ SUSP
40.0000 mg | Freq: Once | INTRAMUSCULAR | Status: AC
  Administered 2023-07-24: 40 mg via INTRAMUSCULAR

## 2023-07-24 MED ORDER — LEVOFLOXACIN 500 MG PO TABS: 500.0000 mg | ORAL_TABLET | Freq: Every day | ORAL | 0 refills | Status: AC

## 2023-07-24 NOTE — Progress Notes (Signed)
 There were no vitals taken for this visit.   Subjective:    Patient ID: Sydney Valenzuela, female    DOB: [birth year], 43 y.o.   MRN: [id number]  HPI: Sydney Valenzuela is a 43 y.o. female  Chief Complaint  Patient presents with   Facial Pain   Ear Pain   UPPER RESPIRATORY TRACT INFECTION Duration: about 2 weeks- since stopping her antibiotics Worst symptom: facial pain Fever: yes Cough: no Shortness of breath: yes Wheezing: no Chest pain: no Chest tightness: no Chest congestion: no Nasal congestion: yes Runny nose: yes Post nasal drip: yes Sneezing: no Sore throat: no Swollen glands: no Sinus pressure: yes Headache: yes Face pain: yes Toothache: yes Ear pain: yes left Ear pressure: yes left Eyes red/itching:no Eye drainage/crusting: no  Vomiting: no Rash: no Fatigue: yes Sick contacts: no Strep contacts: no  Context: worse Recurrent sinusitis: yes Relief with OTC cold/cough medications: no  Treatments attempted: cold/sinus, mucinex, anti-histamine, pseudoephedrine, cough syrup, and antibiotics   Relevant past medical, surgical, family and social history reviewed and updated as indicated. Interim medical history since our last visit reviewed. Allergies and medications reviewed and updated.  Review of Systems  Constitutional: Negative.   HENT:  Positive for facial swelling, postnasal drip, rhinorrhea, sinus pressure, sinus pain and sore throat. Negative for congestion, dental problem, drooling, ear discharge, ear pain, hearing loss, mouth sores, nosebleeds, sneezing, tinnitus, trouble swallowing and voice change.   Eyes: Negative.   Respiratory: Negative.    Cardiovascular: Negative.   Gastrointestinal: Negative.   Musculoskeletal: Negative.   Psychiatric/Behavioral: Negative.    [id number]  Per HPI unless specifically indicated above     Objective:    There were no vitals taken for this visit.  Wt Readings from Last 3 Encounters:  07/01/23 237 lb 12.8  oz (107.9 kg)  02/25/23 242 lb 8 oz (110 kg)  12/30/22 231 lb 9.6 oz (105.1 kg)    Physical Exam Vitals and nursing note reviewed.  Constitutional:      General: She is not in acute distress.    Appearance: Normal appearance. She is obese. She is not ill-appearing, toxic-appearing or diaphoretic.  HENT:     Head: Normocephalic and atraumatic.     Right Ear: Ear canal and external ear normal. There is no impacted cerumen. Tympanic membrane is erythematous and bulging.     Left Ear: Tympanic membrane, ear canal and external ear normal. There is no impacted cerumen.     Nose: Congestion and rhinorrhea present.     Mouth/Throat:     Mouth: Mucous membranes are moist.     Pharynx: Oropharynx is clear. No oropharyngeal exudate or posterior oropharyngeal erythema.  Eyes:     General: No scleral icterus.       Right eye: No discharge.        Left eye: No discharge.     Extraocular Movements: Extraocular movements intact.     Conjunctiva/sclera: Conjunctivae normal.     Pupils: Pupils are equal, round, and reactive to light.  Cardiovascular:     Rate and Rhythm: Normal rate and regular rhythm.     Pulses: Normal pulses.     Heart sounds: Normal heart sounds. No murmur heard.    No friction rub. No gallop.  Pulmonary:     Effort: Pulmonary effort is normal. No respiratory distress.     Breath sounds: Normal breath sounds. No stridor. No wheezing, rhonchi or rales.  Chest:     Chest wall:  No tenderness.  Musculoskeletal:        General: Normal range of motion.     Cervical back: Normal range of motion and neck supple.  Skin:    General: Skin is warm and dry.     Capillary Refill: Capillary refill takes less than 2 seconds.     Coloration: Skin is not jaundiced or pale.     Findings: No bruising, erythema, lesion or rash.  Neurological:     General: No focal deficit present.     Mental Status: She is alert and oriented to person, place, and time. Mental status is at baseline.   Psychiatric:        Mood and Affect: Mood normal.        Behavior: Behavior normal.        Thought Content: Thought content normal.        Judgment: Judgment normal.     Results for orders placed or performed in visit on 07/01/23  Bayer DCA Hb A1c Waived   Collection Time: 07/01/23  4:19 PM  Result Value Ref Range   HB A1C (BAYER DCA - WAIVED) 5.5 4.8 - 5.6 %  Lipid Panel w/o Chol/HDL Ratio   Collection Time: 07/01/23  4:20 PM  Result Value Ref Range   Cholesterol, Total 253 (H) 100 - 199 mg/dL   Triglycerides 865 (H) 0 - 149 mg/dL   HDL 67 >78 mg/dL   VLDL Cholesterol Cal 87 (H) 5 - 40 mg/dL   LDL Chol Calc (NIH) 99 0 - 99 mg/dL  Comprehensive metabolic panel   Collection Time: 07/01/23  4:20 PM  Result Value Ref Range   Glucose 109 (H) 70 - 99 mg/dL   BUN 12 6 - 24 mg/dL   Creatinine, Ser 4.69 0.57 - 1.00 mg/dL   eGFR 629 >52 WU/XLK/4.40   BUN/Creatinine Ratio 21 9 - 23   Sodium 138 134 - 144 mmol/L   Potassium 3.9 3.5 - 5.2 mmol/L   Chloride 100 96 - 106 mmol/L   CO2 21 20 - 29 mmol/L   Calcium 9.3 8.7 - 10.2 mg/dL   Total Protein 6.1 6.0 - 8.5 g/dL   Albumin 4.0 3.9 - 4.9 g/dL   Globulin, Total 2.1 1.5 - 4.5 g/dL   Bilirubin Total 0.3 0.0 - 1.2 mg/dL   Alkaline Phosphatase 80 44 - 121 IU/L   AST 15 0 - 40 IU/L   ALT 15 0 - 32 IU/L  CBC with Differential/Platelet   Collection Time: 07/01/23  4:20 PM  Result Value Ref Range   WBC 9.3 3.4 - 10.8 x10E3/uL   RBC 4.34 3.77 - 5.28 x10E6/uL   Hemoglobin 13.2 11.1 - 15.9 g/dL   Hematocrit 10.2 72.5 - 46.6 %   MCV 91 79 - 97 fL   MCH 30.4 26.6 - 33.0 pg   MCHC 33.6 31.5 - 35.7 g/dL   RDW 36.6 44.0 - 34.7 %   Platelets 307 150 - 450 x10E3/uL   Neutrophils 71 Not Estab. %   Lymphs 21 Not Estab. %   Monocytes 5 Not Estab. %   Eos 1 Not Estab. %   Basos 1 Not Estab. %   Neutrophils Absolute 6.7 1.4 - 7.0 x10E3/uL   Lymphocytes Absolute 2.0 0.7 - 3.1 x10E3/uL   Monocytes Absolute 0.5 0.1 - 0.9 x10E3/uL   EOS  (ABSOLUTE) 0.1 0.0 - 0.4 x10E3/uL   Basophils Absolute 0.1 0.0 - 0.2 x10E3/uL   Immature Granulocytes 1 Not Estab. %  Immature Grans (Abs) 0.1 0.0 - 0.1 x10E3/uL      Assessment & Plan:   Problem List Items Addressed This Visit   None Visit Diagnoses       Acute bacterial sinusitis    -  Primary   Will treat with triamcinalone and levaquin . Call if not getting better or getting worse.   Relevant Medications   levofloxacin  (LEVAQUIN ) 500 MG tablet   triamcinolone  acetonide (KENALOG -40) injection 40 mg (Start on 07/24/2023  3:45 PM)        Follow up plan: Return if symptoms worsen or fail to improve.

## 2023-07-28 ENCOUNTER — Encounter: Payer: No Typology Code available for payment source | Admitting: Physical Therapy

## 2023-07-29 ENCOUNTER — Encounter: Payer: Self-pay | Admitting: Family Medicine

## 2023-07-30 ENCOUNTER — Encounter: Payer: No Typology Code available for payment source | Admitting: Physical Therapy

## 2023-07-31 ENCOUNTER — Other Ambulatory Visit: Payer: Self-pay

## 2023-07-31 ENCOUNTER — Other Ambulatory Visit: Payer: Self-pay | Admitting: Family Medicine

## 2023-07-31 MED ORDER — PANTOPRAZOLE SODIUM 40 MG PO TBEC: 40.0000 mg | DELAYED_RELEASE_TABLET | Freq: Two times a day (BID) | ORAL | 1 refills | Status: DC

## 2023-07-31 MED ORDER — TIZANIDINE HCL 4 MG PO TABS: 4.0000 mg | ORAL_TABLET | Freq: Four times a day (QID) | ORAL | 3 refills | Status: DC | PRN

## 2023-07-31 MED ORDER — PREGABALIN 100 MG PO CAPS: ORAL_CAPSULE | ORAL | 5 refills | Status: DC

## 2023-07-31 MED ORDER — PREGABALIN 50 MG PO CAPS: 50.0000 mg | ORAL_CAPSULE | Freq: Two times a day (BID) | ORAL | 5 refills | Status: DC

## 2023-08-02 NOTE — Telephone Encounter (Signed)
 Requested medication (s) are due for refill today: Yes  Requested medication (s) are on the active medication list: Yes  Last refill:  07/31/23  Future visit scheduled: Yes  Notes to clinic:  Unable to refill per protocol, cannot delegate.Pharmacy clarification needed      Requested Prescriptions  Pending Prescriptions Disp Refills   tiZANidine  (ZANAFLEX ) 4 MG tablet [Pharmacy Med Name: TIZANIDINE  (ZANAFLEX ) 4 MG TABLET] 150 tablet 3    Sig: Take 1 tablet (4 mg total) by mouth every 6 (six) hours as needed for muscle spasms. OK to take 2 at bedtime     Not Delegated - Cardiovascular:  Alpha-2 Agonists - tizanidine  Failed - 08/02/2023  1:06 AM      Failed - This refill cannot be delegated      Passed - Valid encounter within last 6 months    Recent Outpatient Visits           1 week ago Acute bacterial sinusitis   [facility] [place], Megan P, DO   1 month ago Mixed hyperlipidemia   [place] [facility] [place], Megan P, DO   1 month ago Non-recurrent acute suppurative otitis media of right ear without spontaneous rupture of tympanic membrane   [facility] [place], Megan P, DO   2 months ago Depression, major, single episode, severe [facility])   [facility] [place], Megan P, DO   2 months ago Pneumonia due to influenza A virus    [facility] [place], [place], DO

## 2023-08-04 ENCOUNTER — Encounter: Payer: No Typology Code available for payment source | Admitting: Physical Therapy

## 2023-08-06 ENCOUNTER — Telehealth: Payer: Self-pay

## 2023-08-06 ENCOUNTER — Encounter: Payer: No Typology Code available for payment source | Admitting: Physical Therapy

## 2023-08-06 NOTE — Telephone Encounter (Unsigned)
 Copied from CRM [id number]. Topic: Clinical - Medication Question >> Aug 06, 2023 12:00 PM Tiffany S wrote: Reason for CRM: Pharmacy needs clarification on prescription  pregabalin  (LYRICA ) 100 MG capsule [[id number]] pregabalin  (LYRICA ) 50 MG capsule [[id number]]   Which dose is needed   [id number] option 2 Ref [id number]

## 2023-08-07 NOTE — Telephone Encounter (Signed)
 Copied from CRM [id number]. Topic: Clinical - Prescription Issue >> Aug 07, 2023 11:20 AM Rennis Case wrote: Reason for CRM: Christina w/ [facility] calling for clarification on instructions for pregabalin  (LYRICA ) 100 MG capsule.   Best call back number: [phone] option 2  Reference number: [id number]

## 2023-08-08 ENCOUNTER — Other Ambulatory Visit: Payer: Self-pay

## 2023-08-08 NOTE — Telephone Encounter (Signed)
 Returned call to pharmacy and clarified prescriptions.

## 2023-08-08 NOTE — Telephone Encounter (Signed)
 Pharmacy called back to check on this request. Would like a call back to clarify correct dose/order. Phone: [phone] option 2 Ref [phone]. Thank You

## 2023-08-10 MED ORDER — TIZANIDINE HCL 4 MG PO TABS: 4.0000 mg | ORAL_TABLET | Freq: Four times a day (QID) | ORAL | 0 refills | Status: DC | PRN

## 2023-08-10 MED ORDER — PREGABALIN 50 MG PO CAPS: 50.0000 mg | ORAL_CAPSULE | Freq: Two times a day (BID) | ORAL | 1 refills | Status: DC

## 2023-08-10 MED ORDER — PREGABALIN 100 MG PO CAPS: ORAL_CAPSULE | ORAL | 1 refills | Status: DC

## 2023-08-13 ENCOUNTER — Other Ambulatory Visit: Payer: Self-pay

## 2023-08-29 ENCOUNTER — Emergency Department
Admission: EM | Admit: 2023-08-29 | Discharge: 2023-08-29 | Disposition: A | Discharge status: Home / Self Care | Attending: Emergency Medicine | Admitting: Emergency Medicine

## 2023-08-29 ENCOUNTER — Emergency Department

## 2023-08-29 ENCOUNTER — Other Ambulatory Visit: Payer: Self-pay

## 2023-08-29 DIAGNOSIS — E669 Obesity, unspecified: Secondary | ICD-10-CM | POA: Diagnosis not present

## 2023-08-29 DIAGNOSIS — I10 Essential (primary) hypertension: Secondary | ICD-10-CM | POA: Diagnosis not present

## 2023-08-29 DIAGNOSIS — R079 Chest pain, unspecified: Secondary | ICD-10-CM | POA: Diagnosis not present

## 2023-08-29 LAB — HEPATIC FUNCTION PANEL
ALT: 11 U/L (ref 0–44)
AST: 15 U/L (ref 15–41)
Albumin: 3.6 g/dL (ref 3.5–5.0)
Alkaline Phosphatase: 77 U/L (ref 38–126)
Bilirubin, Direct: <0.1 mg/dL (ref 0.0–0.2)
Total Bilirubin: 0.7 mg/dL (ref 0.0–1.2)
Total Protein: 6.8 g/dL (ref 6.5–8.1)

## 2023-08-29 LAB — CBC
HCT: 40.2 % (ref 36.0–46.0)
Hemoglobin: 13.2 g/dL (ref 12.0–15.0)
MCH: 29.9 pg (ref 26.0–34.0)
MCHC: 32.8 g/dL (ref 30.0–36.0)
MCV: 91.2 fL (ref 80.0–100.0)
Platelets: 259 10*3/uL (ref 150–400)
RBC: 4.41 MIL/uL (ref 3.87–5.11)
RDW: 13.9 % (ref 11.5–15.5)
WBC: 8.1 10*3/uL (ref 4.0–10.5)
nRBC: 0 % (ref 0.0–0.2)

## 2023-08-29 LAB — URINALYSIS, ROUTINE W REFLEX MICROSCOPIC
Bilirubin Urine: NEGATIVE
Glucose, UA: NEGATIVE mg/dL
Hgb urine dipstick: NEGATIVE
Ketones, ur: NEGATIVE mg/dL
Leukocytes,Ua: NEGATIVE
Nitrite: NEGATIVE
Protein, ur: NEGATIVE mg/dL
Specific Gravity, Urine: 1.009 (ref 1.005–1.030)
pH: 6 (ref 5.0–8.0)

## 2023-08-29 LAB — BASIC METABOLIC PANEL WITH GFR
Anion gap: 10 (ref 5–15)
BUN: 13 mg/dL (ref 6–20)
CO2: 23 mmol/L (ref 22–32)
Calcium: 8.8 mg/dL — ABNORMAL LOW (ref 8.9–10.3)
Chloride: 102 mmol/L (ref 98–111)
Creatinine, Ser: 0.68 mg/dL (ref 0.44–1.00)
GFR, Estimated: >60 mL/min (ref >60)
Glucose, Bld: 87 mg/dL (ref 70–99)
Potassium: 4.1 mmol/L (ref 3.5–5.1)
Sodium: 135 mmol/L (ref 135–145)

## 2023-08-29 LAB — TROPONIN I (HIGH SENSITIVITY): Troponin I (High Sensitivity): 3 ng/L (ref <18)

## 2023-08-29 LAB — LIPASE, BLOOD: Lipase: 44 U/L (ref 11–51)

## 2023-08-29 MED ORDER — DEXAMETHASONE SODIUM PHOSPHATE 10 MG/ML IJ SOLN
10.0000 mg | Freq: Once | INTRAMUSCULAR | Status: AC
  Administered 2023-08-29: 10 mg via INTRAMUSCULAR
  Filled 2023-08-29: qty 1

## 2023-08-29 MED ORDER — PREDNISONE 20 MG PO TABS: 20.0000 mg | ORAL_TABLET | Freq: Two times a day (BID) | ORAL | 0 refills | Status: AC

## 2023-08-29 NOTE — ED Provider Notes (Signed)
 [facility] Emergency Department Provider Note     Event Date/Time   First MD Initiated Contact with Patient 08/29/23 1246     (approximate)   History   Chest Pain   HPI  Sydney Valenzuela is a 43 y.o. female with a history of obesity, fibromyalgia, lumbar radiculopathy, HTN, hiatal hernia, anxiety, and 4 weeks status post L5/S1 decompression surgery secondary to HNP, presents to the ED endorsing back pain.  Patient notes onset 2 days prior with associated chest discomfort and left arm pain.  She denies any nausea, vomiting, or diaphoresis.  No cough or congestion reported.  No bladder or bowel incontinence, foot drop, saddle anesthesia reported.   Physical Exam   Triage Vital Signs: ED Triage Vitals  Encounter Vitals Group     BP 08/29/23 1148 (!) 148/90     Systolic BP Percentile --      Diastolic BP Percentile --      Pulse Rate 08/29/23 1148 77     Resp 08/29/23 1148 18     Temp 08/29/23 1148 98.3 F (36.8 C)     Temp Source 08/29/23 1148 Oral     SpO2 08/29/23 1148 94 %     Weight 08/29/23 1146 230 lb (104.3 kg)     Height 08/29/23 1146 5\' 1"  (1.549 m)     Head Circumference --      Peak Flow --      Pain Score 08/29/23 1145 6     Pain Loc --      Pain Education --      Exclude from Growth Chart --     Most recent vital signs: Vitals:   08/29/23 1148  BP: (!) 148/90  Pulse: 77  Resp: 18  Temp: 98.3 F (36.8 C)  SpO2: 94%    General Awake, no distress. NAD HEENT NCAT. PERRL. EOMI. No rhinorrhea. Mucous membranes are moist.  CV:  Good peripheral perfusion. RRR RESP:  Normal effort. CTA ABD:  No distention.  MSK:  Normal spinal alignment without midline tenderness, spasm, vomiting, or step-off reactive range of motion of all extremities noted.  ED Results / Procedures / Treatments   Labs (all labs ordered are listed, but only abnormal results are displayed) Labs Reviewed  BASIC METABOLIC PANEL WITH GFR - Abnormal; Notable  for the following components:      Result Value   Calcium 8.8 (*)    All other components within normal limits  URINALYSIS, ROUTINE W REFLEX MICROSCOPIC - Abnormal; Notable for the following components:   Color, Urine STRAW (*)    APPearance CLEAR (*)    All other components within normal limits  CBC  LIPASE, BLOOD  HEPATIC FUNCTION PANEL  TROPONIN I (HIGH SENSITIVITY)    EKG  Vent. rate 71 BPM PR interval 126 ms QRS duration 82 ms QT/QTcB 350/380 ms P-R-T axes 5 20 10  Normal sinus rhythm Cannot rule out Anterior infarct , age undetermined Abnormal ECG When compared with ECG of 16-Oct-2016 13:21, Nonspecific T wave abnormality now evident in Anterior leads  RADIOLOGY  I personally viewed and evaluated these images as part of my medical decision making, as well as reviewing the written report by the radiologist.  ED Provider Interpretation: No acute findings  DG Chest 2 View Result Date: 08/29/2023 CLINICAL DATA:  Chest and left arm pain for several days. EXAM: CHEST - 2 VIEW COMPARISON:  10/16/2016 FINDINGS: The heart size and mediastinal contours are within normal limits.  Stable mild elevation of left hemidiaphragm. Both lungs are clear. The visualized skeletal structures are unremarkable. IMPRESSION: No active cardiopulmonary disease. Electronically Signed   By: Marlyce Sine M.D.   On: 08/29/2023 13:15     PROCEDURES:  Critical Care performed: No  Procedures   MEDICATIONS ORDERED IN ED: Medications  dexamethasone  (DECADRON ) injection 10 mg (10 mg Intramuscular Given 08/29/23 1422)     IMPRESSION / MDM / ASSESSMENT AND PLAN / ED COURSE  I reviewed the triage vital signs and the nursing notes.                              Differential diagnosis includes, but is not limited to, ACS, aortic dissection, pulmonary embolism, cardiac tamponade, pneumothorax, pneumonia, pericarditis, myocarditis, GI-related causes including esophagitis/gastritis, and musculoskeletal  chest wall pain.     Patient's presentation is most consistent with acute presentation with potential threat to life or bodily function.  Patient's diagnosis is consistent with nonspecific chest pain likely of a musculoskeletal etiology.  Patient with reassuring exam and workup at this time.  No acute lab abnormalities are noted.  Low suspicion for ACS based on presentation and negative troponin.  EKG without malignant arrhythmia and chest x-ray interpreted by me, negative for any acute process.  Patient will be discharged home with prescriptions for prednisone . Patient is to follow up with her primary provider as discussed, as needed or otherwise directed. Patient is given ED precautions to return to the ED for any worsening or new symptoms.   FINAL CLINICAL IMPRESSION(S) / ED DIAGNOSES   Final diagnoses:  Nonspecific chest pain     Rx / DC Orders   ED Discharge Orders          Ordered    predniSONE  (DELTASONE ) 20 MG tablet  2 times daily with meals        08/29/23 1448             Note:  This document was prepared using Dragon voice recognition software and may include unintentional dictation errors.    May Sparks, PA-C 08/29/23 1910    Bradler, Evan K, MD 08/30/23 [phone]

## 2023-08-29 NOTE — Discharge Instructions (Signed)
 Your exam, labs, EKG, and chest x-ray are all normal and reassuring at this time.  No evidence of a serious or cardiac source for your symptoms.  You should follow-up with your primary provider for ongoing evaluation.  Take the prescription steroid as directed.  Return to the ED if needed.

## 2023-08-29 NOTE — ED Triage Notes (Signed)
 Pt presents to the ED pov from home with husband. Pt reports having back surgery 4 weeks ago. Pt reports back pain, ches tpain and left arm pain that started about 2-3 days ago. Pt states that she feels like she has been more confused since the surgery. Pt A&Ox4 at time of triage. VSS.

## 2023-08-29 NOTE — ED Notes (Signed)
 See triage note  States she has not felt good all this week Been having some chest and left arm discomfort  States it feels like "gas"  denies any fever or cough

## 2023-09-04 ENCOUNTER — Inpatient Hospital Stay: Admitting: Family Medicine

## 2023-09-05 ENCOUNTER — Inpatient Hospital Stay: Admitting: Family Medicine

## 2023-09-08 ENCOUNTER — Encounter: Payer: Self-pay | Admitting: Family Medicine

## 2023-09-08 ENCOUNTER — Ambulatory Visit: Admitting: Family Medicine

## 2023-09-08 VITALS — BP 145/87 | HR 96 | Ht 63.0 in | Wt 237.4 lb

## 2023-09-08 DIAGNOSIS — R079 Chest pain, unspecified: Secondary | ICD-10-CM

## 2023-09-08 DIAGNOSIS — F332 Major depressive disorder, recurrent severe without psychotic features: Secondary | ICD-10-CM | POA: Diagnosis not present

## 2023-09-08 DIAGNOSIS — K224 Dyskinesia of esophagus: Secondary | ICD-10-CM

## 2023-09-08 DIAGNOSIS — F322 Major depressive disorder, single episode, severe without psychotic features: Secondary | ICD-10-CM

## 2023-09-08 DIAGNOSIS — F419 Anxiety disorder, unspecified: Secondary | ICD-10-CM

## 2023-09-08 MED ORDER — DICYCLOMINE HCL 20 MG PO TABS: 20.0000 mg | ORAL_TABLET | Freq: Three times a day (TID) | ORAL | 2 refills | Status: DC

## 2023-09-08 NOTE — Progress Notes (Signed)
 BP (!) 145/87 (BP Location: Left Arm, Patient Position: Sitting, Cuff Size: Normal)   Pulse 96   Ht 5' 3 (1.6 m)   Wt 237 lb 6.4 oz (107.7 kg)   SpO2 96%   BMI 42.05 kg/m    Subjective:    Patient ID: Sydney Valenzuela, female    DOB: [birth year], 43 y.o.   MRN: [id number]  HPI: Sydney Valenzuela is a 43 y.o. female  Chief Complaint  Patient presents with   Hospitalization Follow-up    Patient was seen in ER 5/30 for chest pain and back pain. Back surgery 4 weeks. Does have a follow up appt with surgeon's office in 2 weeks.    ER FOLLOW UP Time since discharge: 10 days Hospital/facility: [facility] Diagnosis: Non-specific/musculoskeletal Chest pain Procedures/tests: EKG- nonspecific t wave changes in anterior leads, CXR- normal, labs Consultants: None New medications: Prednisone  Discharge instructions:  Follow up here Status: stable  Since getting out of the hospital, Sydney Valenzuela has been feeling about the same. The prednisone  didn't help.    She notes that she continues with pains in her chest- she feels like pills are getting stuck and it feels like things are going to explode. She notes that her air gets stuck in her chest.   She has a lot going on. She notes that she had a microdisectomy surgery done on her back about 3 weeks ago. She notes that she had lost control of her bladder about a week prior to her surgery. Has been having issues with her in-laws regarding her late son's possessions and she is deciding whether she should take them to court. This has been causing a large amount of stress. She is concerned that much of her symptoms are due to her stress.   Relevant past medical, surgical, family and social history reviewed and updated as indicated. Interim medical history since our last visit reviewed. Allergies and medications reviewed and updated.  Review of Systems  Constitutional: Negative.   Respiratory: Negative.    Cardiovascular:  Positive for chest pain. Negative for  palpitations and leg swelling.  Gastrointestinal: Negative.   Musculoskeletal:  Positive for arthralgias, back pain and myalgias. Negative for gait problem, joint swelling, neck pain and neck stiffness.  Skin: Negative.   Neurological: Negative.   Psychiatric/Behavioral:  Positive for dysphoric mood. Negative for agitation, behavioral problems, confusion, decreased concentration, hallucinations, self-injury, sleep disturbance and suicidal ideas. The patient is nervous/anxious. The patient is not hyperactive.     Per HPI unless specifically indicated above     Objective:     BP (!) 145/87 (BP Location: Left Arm, Patient Position: Sitting, Cuff Size: Normal)   Pulse 96   Ht 5' 3 (1.6 m)   Wt 237 lb 6.4 oz (107.7 kg)   SpO2 96%   BMI 42.05 kg/m   Wt Readings from Last 3 Encounters:  09/08/23 237 lb 6.4 oz (107.7 kg)  08/29/23 230 lb (104.3 kg)  07/24/23 236 lb 12.8 oz (107.4 kg)    Physical Exam Vitals and nursing note reviewed.  Constitutional:      General: She is not in acute distress.    Appearance: Normal appearance. She is not ill-appearing, toxic-appearing or diaphoretic.  HENT:     Head: Normocephalic and atraumatic.     Right Ear: External ear normal.     Left Ear: External ear normal.     Nose: Nose normal.     Mouth/Throat:     Mouth: Mucous membranes are  moist.     Pharynx: Oropharynx is clear.   Eyes:     General: No scleral icterus.       Right eye: No discharge.        Left eye: No discharge.     Extraocular Movements: Extraocular movements intact.     Conjunctiva/sclera: Conjunctivae normal.     Pupils: Pupils are equal, round, and reactive to light.    Cardiovascular:     Rate and Rhythm: Normal rate and regular rhythm.     Pulses: Normal pulses.     Heart sounds: Normal heart sounds. No murmur heard.    No friction rub. No gallop.  Pulmonary:     Effort: Pulmonary effort is normal. No respiratory distress.     Breath sounds: Normal breath  sounds. No stridor. No wheezing, rhonchi or rales.  Chest:     Chest wall: No tenderness.   Musculoskeletal:        General: Normal range of motion.     Cervical back: Normal range of motion and neck supple.   Skin:    General: Skin is warm and dry.     Capillary Refill: Capillary refill takes less than 2 seconds.     Coloration: Skin is not jaundiced or pale.     Findings: No bruising, erythema, lesion or rash.   Neurological:     General: No focal deficit present.     Mental Status: She is alert and oriented to person, place, and time. Mental status is at baseline.   Psychiatric:        Mood and Affect: Mood is anxious.        Behavior: Behavior normal.        Thought Content: Thought content normal.        Judgment: Judgment normal.     Results for orders placed or performed during the hospital encounter of 08/29/23  Basic metabolic panel   Collection Time: 08/29/23 11:49 AM  Result Value Ref Range   Sodium 135 135 - 145 mmol/L   Potassium 4.1 3.5 - 5.1 mmol/L   Chloride 102 98 - 111 mmol/L   CO2 23 22 - 32 mmol/L   Glucose, Bld 87 70 - 99 mg/dL   BUN 13 6 - 20 mg/dL   Creatinine, Ser 4.09 0.44 - 1.00 mg/dL   Calcium 8.8 (L) 8.9 - 10.3 mg/dL   GFR, Estimated >81 >19 mL/min   Anion gap 10 5 - 15  CBC   Collection Time: 08/29/23 11:49 AM  Result Value Ref Range   WBC 8.1 4.0 - 10.5 K/uL   RBC 4.41 3.87 - 5.11 MIL/uL   Hemoglobin 13.2 12.0 - 15.0 g/dL   HCT 14.7 82.9 - 56.2 %   MCV 91.2 80.0 - 100.0 fL   MCH 29.9 26.0 - 34.0 pg   MCHC 32.8 30.0 - 36.0 g/dL   RDW 13.0 86.5 - 78.4 %   Platelets 259 150 - 400 K/uL   nRBC 0.0 0.0 - 0.2 %  Lipase, blood   Collection Time: 08/29/23 11:49 AM  Result Value Ref Range   Lipase 44 11 - 51 U/L  Hepatic function panel   Collection Time: 08/29/23 11:49 AM  Result Value Ref Range   Total Protein 6.8 6.5 - 8.1 g/dL   Albumin 3.6 3.5 - 5.0 g/dL   AST 15 15 - 41 U/L   ALT 11 0 - 44 U/L   Alkaline Phosphatase 77 38 - 126  U/L  Total Bilirubin 0.7 0.0 - 1.2 mg/dL   Bilirubin, Direct <1.6 0.0 - 0.2 mg/dL   Indirect Bilirubin NOT CALCULATED 0.3 - 0.9 mg/dL  Troponin I (High Sensitivity)   Collection Time: 08/29/23 11:49 AM  Result Value Ref Range   Troponin I (High Sensitivity) 3 <18 ng/L  Urinalysis, Routine w reflex microscopic -Urine, Clean Catch   Collection Time: 08/29/23  2:20 PM  Result Value Ref Range   Color, Urine STRAW (A) YELLOW   APPearance CLEAR (A) CLEAR   Specific Gravity, Urine 1.009 1.005 - 1.030   pH 6.0 5.0 - 8.0   Glucose, UA NEGATIVE NEGATIVE mg/dL   Hgb urine dipstick NEGATIVE NEGATIVE   Bilirubin Urine NEGATIVE NEGATIVE   Ketones, ur NEGATIVE NEGATIVE mg/dL   Protein, ur NEGATIVE NEGATIVE mg/dL   Nitrite NEGATIVE NEGATIVE   Leukocytes,Ua NEGATIVE NEGATIVE      Assessment & Plan:   Problem List Items Addressed This Visit       Other   Depression, major, recurrent (HCC)   Encouraged her to follow up with her psychiatrist. She is concerned about needing an inpatient stay- discussed that they can help with that. Call with any concerns. Continue to monitor closely.       Anxiety   Encouraged her to follow up with her psychiatrist. She is concerned about needing an inpatient stay- discussed that they can help with that. Call with any concerns. Continue to monitor closely.       Other Visit Diagnoses       Chest pain, unspecified type    -  Primary   EKG normal. Concern for anxiety and esophageal spasm. Will treat. Follow up 2 weeks. Warning signs to go to ER discussed.     Esophageal spasm       Will start her on bentyl  and recheck in 2 weeks. Call with any concerns.        Follow up plan: Return in about 2 weeks (around 09/22/2023) for ok to use same day if needed.   >25 minutes spent with patient today

## 2023-09-14 NOTE — Assessment & Plan Note (Addendum)
 Encouraged her to follow up with her psychiatrist. She is concerned about needing an inpatient stay- discussed that they can help with that. Call with any concerns. Continue to monitor closely.

## 2023-09-14 NOTE — Assessment & Plan Note (Signed)
 Encouraged her to follow up with her psychiatrist. She is concerned about needing an inpatient stay- discussed that they can help with that. Call with any concerns. Continue to monitor closely.

## 2023-09-23 ENCOUNTER — Ambulatory Visit: Admitting: Family Medicine

## 2023-10-07 ENCOUNTER — Other Ambulatory Visit: Payer: Self-pay | Admitting: Medical Genetics

## 2023-10-07 ENCOUNTER — Ambulatory Visit: Admitting: Family Medicine

## 2023-10-08 ENCOUNTER — Other Ambulatory Visit: Payer: Self-pay | Admitting: Family Medicine

## 2023-10-10 NOTE — Telephone Encounter (Signed)
 Requested Prescriptions  Pending Prescriptions Disp Refills   baclofen  (LIORESAL ) 10 MG tablet [Pharmacy Med Name: BACLOFEN  10 MG TABLET] 30 tablet 1    Sig: TAKE 1 TABLET BY MOUTH DAILY AT BEDTIME AS NEEDED FOR MUSCLE SPASMS     Analgesics:  Muscle Relaxants - baclofen  Passed - 10/10/2023 12:56 PM      Passed - Cr in normal range and within 180 days    Creatinine, Ser  Date Value Ref Range Status  08/29/2023 0.68 0.44 - 1.00 mg/dL Final         Passed - eGFR is 30 or above and within 180 days    GFR calc Af Amer  Date Value Ref Range Status  02/26/2017 133 >59 mL/min/1.73 Final   GFR, Estimated  Date Value Ref Range Status  08/29/2023 >60 >60 mL/min Final    Comment:    (NOTE) Calculated using the CKD-EPI Creatinine Equation (2021)    eGFR  Date Value Ref Range Status  07/01/2023 115 >59 mL/min/1.73 Final         Passed - Valid encounter within last 6 months    Recent Outpatient Visits           1 month ago Chest pain, unspecified type   [facility] [place], Megan P, DO   2 months ago Acute bacterial sinusitis   [place] [facility] [place], Megan P, DO   3 months ago Mixed hyperlipidemia   [facility] [place], Megan P, DO   3 months ago Non-recurrent acute suppurative otitis media of right ear without spontaneous rupture of tympanic membrane   [facility] [place], Megan P, DO   4 months ago Depression, major, single episode, severe [facility])   [facility] [place], [place], DO

## 2023-10-13 ENCOUNTER — Ambulatory Visit: Payer: Self-pay

## 2023-10-13 NOTE — Telephone Encounter (Signed)
 FYI Only or Action Required?: FYI only for provider.  Patient was last seen in primary care on 09/08/2023 by Vicci Duwaine SQUIBB, DO.  Called Nurse Triage reporting Pain.  Symptoms began several weeks ago.  Interventions attempted: OTC medications: motrin  and Prescription medications: lyrica .  Symptoms are: gradually worsening.  Triage Disposition: No disposition on file.  Patient/caregiver understands and will follow disposition?: Yes            Copied from CRM [id number]. Topic: Clinical - Red Word Triage >> Oct 13, 2023 12:16 PM Antwanette L wrote: Red Word that prompted transfer to Nurse Triage: severe body pain (all over) Reason for Disposition  Body pains are a chronic symptom (recurrent or ongoing AND present > 4 weeks)  Answer Assessment - Initial Assessment Questions 1. ONSET: When did the muscle aches or body pains start?      Ongoing but worse over last 2 days 2. LOCATION: What part of your body is hurting? (e.g., entire body, arms, legs)      Entire body 3. SEVERITY: How bad is the pain? (Scale 1-10; or mild, moderate, severe)     6/10 4. CAUSE: What do you think is causing the pains?     Fibromyalgia  5. FEVER: Do you have a fever? If Yes, ask: What is your temperature, how was it measured, and  when did it start?      no 6. OTHER SYMPTOMS: Do you have any other symptoms? (e.g., chest pain, cold or flu symptoms, rash, weakness, weight loss)     Depletion and fatigue  Protocols used: Muscle Aches and Body Pain-A-AH

## 2023-10-14 ENCOUNTER — Encounter: Payer: Self-pay | Admitting: Family Medicine

## 2023-10-14 ENCOUNTER — Ambulatory Visit (INDEPENDENT_AMBULATORY_CARE_PROVIDER_SITE_OTHER): Admitting: Family Medicine

## 2023-10-14 VITALS — BP 122/80 | HR 71 | Temp 98.2°F | Resp 15 | Ht 62.99 in | Wt 248.8 lb

## 2023-10-14 DIAGNOSIS — R079 Chest pain, unspecified: Secondary | ICD-10-CM | POA: Diagnosis not present

## 2023-10-14 DIAGNOSIS — M797 Fibromyalgia: Secondary | ICD-10-CM | POA: Diagnosis not present

## 2023-10-14 DIAGNOSIS — F332 Major depressive disorder, recurrent severe without psychotic features: Secondary | ICD-10-CM | POA: Diagnosis not present

## 2023-10-14 DIAGNOSIS — Z1231 Encounter for screening mammogram for malignant neoplasm of breast: Secondary | ICD-10-CM

## 2023-10-14 DIAGNOSIS — R0609 Other forms of dyspnea: Secondary | ICD-10-CM | POA: Diagnosis not present

## 2023-10-14 DIAGNOSIS — E66813 Obesity, class 3: Secondary | ICD-10-CM

## 2023-10-14 MED ORDER — TRIAMCINOLONE ACETONIDE 40 MG/ML IJ SUSP
40.0000 mg | Freq: Once | INTRAMUSCULAR | Status: AC
  Administered 2023-10-14: 40 mg via INTRAMUSCULAR

## 2023-10-14 MED ORDER — PREDNISONE 10 MG PO TABS: ORAL_TABLET | ORAL | 0 refills | Status: DC

## 2023-10-14 NOTE — Progress Notes (Unsigned)
 BP 122/80 (BP Location: Left Arm, Patient Position: Sitting, Cuff Size: Large)   Pulse 71   Temp 98.2 F (36.8 C) (Oral)   Resp 15   Ht 5' 2.99 (1.6 m)   Wt 248 lb 12.8 oz (112.9 kg)   LMP  (LMP Unknown)   SpO2 99%   BMI 44.08 kg/m    Subjective:    Patient ID: Sydney Valenzuela, female    DOB: 02-08-1981, 43 y.o.   MRN: [id number]  HPI: Sydney Valenzuela is a 43 y.o. female  Chief Complaint  Patient presents with   Depression    Not doing well currently. Wants peace. Feels she is crashing right now.      Esophageal spasm    Notes that the bentyl  didn't help at all. She notes that she continues with chest pain that comes and goes.   Relevant past medical, surgical, family and social history reviewed and updated as indicated. Interim medical history since our last visit reviewed. Allergies and medications reviewed and updated.  Review of Systems  Constitutional:  Positive for fatigue. Negative for activity change, appetite change, chills, diaphoresis, fever and unexpected weight change.  Respiratory:  Positive for chest tightness and shortness of breath. Negative for apnea, cough, choking, wheezing and stridor.   Cardiovascular:  Positive for chest pain. Negative for palpitations and leg swelling.  Gastrointestinal: Negative.   Musculoskeletal: Negative.   Neurological: Negative.   Psychiatric/Behavioral:  Positive for dysphoric mood. Negative for agitation, behavioral problems, confusion, decreased concentration, hallucinations, self-injury, sleep disturbance and suicidal ideas. The patient is nervous/anxious. The patient is not hyperactive.     Per HPI unless specifically indicated above     Objective:    BP 122/80 (BP Location: Left Arm, Patient Position: Sitting, Cuff Size: Large)   Pulse 71   Temp 98.2 F (36.8 C) (Oral)   Resp 15   Ht 5' 2.99 (1.6 m)   Wt 248 lb 12.8 oz (112.9 kg)   LMP  (LMP Unknown)   SpO2 99%   BMI 44.08 kg/m   Wt Readings from Last 3  Encounters:  10/14/23 248 lb 12.8 oz (112.9 kg)  09/08/23 237 lb 6.4 oz (107.7 kg)  08/29/23 230 lb (104.3 kg)    Physical Exam Vitals and nursing note reviewed.  Constitutional:      General: She is not in acute distress.    Appearance: Normal appearance. She is not ill-appearing, toxic-appearing or diaphoretic.  HENT:     Head: Normocephalic and atraumatic.     Right Ear: External ear normal.     Left Ear: External ear normal.     Nose: Nose normal.     Mouth/Throat:     Mouth: Mucous membranes are moist.     Pharynx: Oropharynx is clear.  Eyes:     General: No scleral icterus.       Right eye: No discharge.        Left eye: No discharge.     Extraocular Movements: Extraocular movements intact.     Conjunctiva/sclera: Conjunctivae normal.     Pupils: Pupils are equal, round, and reactive to light.  Cardiovascular:     Rate and Rhythm: Normal rate and regular rhythm.     Pulses: Normal pulses.     Heart sounds: Normal heart sounds. No murmur heard.    No friction rub. No gallop.  Pulmonary:     Effort: Pulmonary effort is normal. No respiratory distress.     Breath sounds:  Normal breath sounds. No stridor. No wheezing, rhonchi or rales.  Chest:     Chest wall: No tenderness.  Musculoskeletal:        General: Normal range of motion.     Cervical back: Normal range of motion and neck supple.  Skin:    General: Skin is warm and dry.     Capillary Refill: Capillary refill takes less than 2 seconds.     Coloration: Skin is not jaundiced or pale.     Findings: No bruising, erythema, lesion or rash.  Neurological:     General: No focal deficit present.     Mental Status: She is alert and oriented to person, place, and time. Mental status is at baseline.  Psychiatric:        Mood and Affect: Mood is depressed.        Behavior: Behavior normal.        Thought Content: Thought content normal.        Judgment: Judgment normal.     Results for orders placed or performed  during the hospital encounter of 08/29/23  Basic metabolic panel   Collection Time: 08/29/23 11:49 AM  Result Value Ref Range   Sodium 135 135 - 145 mmol/L   Potassium 4.1 3.5 - 5.1 mmol/L   Chloride 102 98 - 111 mmol/L   CO2 23 22 - 32 mmol/L   Glucose, Bld 87 70 - 99 mg/dL   BUN 13 6 - 20 mg/dL   Creatinine, Ser 9.31 0.44 - 1.00 mg/dL   Calcium 8.8 (L) 8.9 - 10.3 mg/dL   GFR, Estimated >39 >39 mL/min   Anion gap 10 5 - 15  CBC   Collection Time: 08/29/23 11:49 AM  Result Value Ref Range   WBC 8.1 4.0 - 10.5 K/uL   RBC 4.41 3.87 - 5.11 MIL/uL   Hemoglobin 13.2 12.0 - 15.0 g/dL   HCT 59.7 63.9 - 53.9 %   MCV 91.2 80.0 - 100.0 fL   MCH 29.9 26.0 - 34.0 pg   MCHC 32.8 30.0 - 36.0 g/dL   RDW 86.0 88.4 - 84.4 %   Platelets 259 150 - 400 K/uL   nRBC 0.0 0.0 - 0.2 %  Lipase, blood   Collection Time: 08/29/23 11:49 AM  Result Value Ref Range   Lipase 44 11 - 51 U/L  Hepatic function panel   Collection Time: 08/29/23 11:49 AM  Result Value Ref Range   Total Protein 6.8 6.5 - 8.1 g/dL   Albumin 3.6 3.5 - 5.0 g/dL   AST 15 15 - 41 U/L   ALT 11 0 - 44 U/L   Alkaline Phosphatase 77 38 - 126 U/L   Total Bilirubin 0.7 0.0 - 1.2 mg/dL   Bilirubin, Direct <9.8 0.0 - 0.2 mg/dL   Indirect Bilirubin NOT CALCULATED 0.3 - 0.9 mg/dL  Troponin I (High Sensitivity)   Collection Time: 08/29/23 11:49 AM  Result Value Ref Range   Troponin I (High Sensitivity) 3 <18 ng/L  Urinalysis, Routine w reflex microscopic -Urine, Clean Catch   Collection Time: 08/29/23  2:20 PM  Result Value Ref Range   Color, Urine STRAW (A) YELLOW   APPearance CLEAR (A) CLEAR   Specific Gravity, Urine 1.009 1.005 - 1.030   pH 6.0 5.0 - 8.0   Glucose, UA NEGATIVE NEGATIVE mg/dL   Hgb urine dipstick NEGATIVE NEGATIVE   Bilirubin Urine NEGATIVE NEGATIVE   Ketones, ur NEGATIVE NEGATIVE mg/dL   Protein, ur NEGATIVE NEGATIVE  mg/dL   Nitrite NEGATIVE NEGATIVE   Leukocytes,Ua NEGATIVE NEGATIVE      Assessment &  Plan:   Problem List Items Addressed This Visit   None Visit Diagnoses       Chest pain, unspecified type    -  Primary   Relevant Orders   Ambulatory referral to Cardiology     DOE (dyspnea on exertion)       Relevant Orders   Ambulatory referral to Cardiology        Follow up plan: No follow-ups on file.

## 2023-10-15 NOTE — Assessment & Plan Note (Signed)
 Not doing well with recently seeing her son's autopsy results. Continue to follow with psychiatry. Continue to monitor. Call with any concerns.

## 2023-10-15 NOTE — Assessment & Plan Note (Signed)
 Would benefit from GLP-1- will hold on starting until after she sees cardiology.

## 2023-10-15 NOTE — Assessment & Plan Note (Signed)
 In a flare. Will start her on prednisone . Call with any concerns. Follow up 1 month.

## 2023-10-23 ENCOUNTER — Ambulatory Visit: Admitting: Family Medicine

## 2023-10-29 ENCOUNTER — Other Ambulatory Visit: Payer: Self-pay

## 2023-10-29 ENCOUNTER — Telehealth: Admitting: Family Medicine

## 2023-10-29 ENCOUNTER — Encounter: Payer: Self-pay | Admitting: Family Medicine

## 2023-10-29 DIAGNOSIS — E66813 Obesity, class 3: Secondary | ICD-10-CM | POA: Diagnosis not present

## 2023-10-29 DIAGNOSIS — G43909 Migraine, unspecified, not intractable, without status migrainosus: Secondary | ICD-10-CM

## 2023-10-29 DIAGNOSIS — M797 Fibromyalgia: Secondary | ICD-10-CM

## 2023-10-29 DIAGNOSIS — F332 Major depressive disorder, recurrent severe without psychotic features: Secondary | ICD-10-CM

## 2023-10-29 DIAGNOSIS — Z6841 Body Mass Index (BMI) 40.0 and over, adult: Secondary | ICD-10-CM

## 2023-10-29 MED ORDER — WEGOVY 0.5 MG/0.5ML ~~LOC~~ SOAJ
0.5000 mg | SUBCUTANEOUS | 1 refills | Status: DC
  Filled 2023-10-29 – 2023-12-30 (×2): qty 2, 28d supply, fill #0
  Filled 2024-01-30 – 2024-02-05 (×3): qty 2, 28d supply, fill #1

## 2023-10-29 MED ORDER — WEGOVY 0.25 MG/0.5ML ~~LOC~~ SOAJ
0.2500 mg | SUBCUTANEOUS | 0 refills | Status: DC
  Filled 2023-10-29 – 2023-12-03 (×3): qty 2, 28d supply, fill #0

## 2023-10-29 MED ORDER — NURTEC 75 MG PO TBDP: 75.0000 mg | ORAL_TABLET | Freq: Every day | ORAL | 12 refills | Status: AC | PRN

## 2023-10-29 NOTE — Progress Notes (Signed)
 Called patient to schedule 2 week follow up. [facility] full will try calling next business day.

## 2023-10-29 NOTE — Assessment & Plan Note (Signed)
 Not doing well. Has been taking imitrex  and years ago did rizatriptan. Will start her on nurtec. Call with any concerns. Continue to monitor.

## 2023-10-29 NOTE — Assessment & Plan Note (Signed)
 Just finished a round of prednisone . Will work on treating other issues. Hold on another round of prednisone  at this time.

## 2023-10-29 NOTE — Assessment & Plan Note (Addendum)
 Not doing well. In exacerbation due to seeing her son's autopsy report. Needs to call her psychiatrist- to do that today. Continue to monitor. Call with any concerns.

## 2023-10-29 NOTE — Progress Notes (Signed)
 LMP  (LMP Unknown)    Subjective:    Patient ID: Sydney Valenzuela, female    DOB: 1980-08-07, 43 y.o.   MRN: [id number]  HPI: Sydney Valenzuela is a 43 y.o. female  Chief Complaint  Patient presents with   Depression   Obesity        Migraine        Notes that the prednisone  helped at first, but then it stopped helping. Has no energy right now. Severely depressed. She is having difficulty doing anything right now. Sleep is terrible. She has completely crashed. She is having a migraine every day. She is hurting everywhere. She has taken imitrex  in the past and took rizatriptan years ago.   OBESITY Duration: chronic Previous attempts at weight loss: yes- portion control, diet, exercise, weight watchers Complications of obesity: depression, IFG, hyperlipidemia  Peak weight: 248 (current) Weight loss goal: to be healthy Weight loss to date: none Requesting obesity pharmacotherapy: yes Current weight loss supplements/medications: no Previous weight loss supplements/meds: no  Clinical coverage for weight loss GLP's   Medication being dispensed is Wegovy  3 mL/28 day. Titration doses are 2 mL/28 days.   []  Product being prescribed is FDA approved for the indication, age, weight (if applicable) and not does not exceed dosing limits per the Prescribing Information per the clinical conditions for use.  [x]  Patient's baseline weight measured within the last 45 days as required by provider before dispensing.  [x]  Patient is new to therapy and One of the following:   [x]  The beneficiary is 44 years of age or over and has ONE of the following:  [x]  A BMI greater than or equal to 30 kg/m2  [x]  A BMI greater than or equal to 27 kg/m2 with at least one weight-related comorbidity/risk factor/complication (i.e. hypertension, type 2 diabetes, obstructive sleep apnea, cardiovascular disease, dyslipidemia)  [x]  If patient has one weight-related comorbidity/risk factor/complication (i.e.  hypertension, type 2 diabetes, obstructive sleep apnea, cardiovascular disease, dyslipidemia), please list Patient suffers from weight-related comorbidity/risk factor/complication depression, IFG, Hyperlipidemia  []  The beneficiary is 48 years of age or older with a BMI greater than or equal to 27 kg/m2 AND has established cardiovascular disease (CVD) defined as having a history of myocardial infarction, stroke, or symptomatic peripheral disease, to be documented on the PA form. AND  [x]  The beneficiary is currently on and will continue lifestyle modification including structured nutrition and physical activity, unless physical activity is not clinically appropriate at the time GLP1 therapy commences AND  [x]  The beneficiary will NOT be using the requested agent in combination with another GLP-1 receptor agonist agent AND  [x]  The beneficiary does NOT have any FDA-labeled contraindications to the requested agent, including pregnancy, lactation, history of medullary thyroid  cancer or multiple endocrine neoplasia type II.   Last BMI/Weight/Height recorded Estimated body mass index is 44.08 kg/m as calculated from the following:   Height as of 10/14/23: 5' 2.99 (1.6 m).   Weight as of 10/14/23: 248 lb 12.8 oz (112.9 kg).    Relevant past medical, surgical, family and social history reviewed and updated as indicated. Interim medical history since our last visit reviewed. Allergies and medications reviewed and updated.  Review of Systems  Constitutional:  Positive for activity change, appetite change and fatigue. Negative for chills, diaphoresis, fever and unexpected weight change.  HENT: Negative.    Respiratory: Negative.    Cardiovascular: Negative.   Musculoskeletal:  Positive for arthralgias, back pain, myalgias, neck pain and neck  stiffness. Negative for gait problem and joint swelling.  Skin: Negative.   Neurological:  Positive for weakness and headaches. Negative for dizziness,  tremors, seizures, syncope, facial asymmetry, speech difficulty, light-headedness and numbness.  Psychiatric/Behavioral:  Positive for decreased concentration, dysphoric mood and sleep disturbance. Negative for agitation, behavioral problems, confusion, hallucinations, self-injury and suicidal ideas. The patient is nervous/anxious. The patient is not hyperactive.     Per HPI unless specifically indicated above     Objective:    LMP  (LMP Unknown)   Wt Readings from Last 3 Encounters:  10/14/23 248 lb 12.8 oz (112.9 kg)  09/08/23 237 lb 6.4 oz (107.7 kg)  08/29/23 230 lb (104.3 kg)    Physical Exam Vitals and nursing note reviewed.  Constitutional:      General: She is not in acute distress.    Appearance: Normal appearance. She is obese. She is not ill-appearing, toxic-appearing or diaphoretic.  HENT:     Head: Normocephalic and atraumatic.     Right Ear: External ear normal.     Left Ear: External ear normal.     Nose: Nose normal.     Mouth/Throat:     Mouth: Mucous membranes are moist.     Pharynx: Oropharynx is clear.  Eyes:     General: No scleral icterus.       Right eye: No discharge.        Left eye: No discharge.     Conjunctiva/sclera: Conjunctivae normal.     Pupils: Pupils are equal, round, and reactive to light.  Pulmonary:     Effort: Pulmonary effort is normal. No respiratory distress.     Comments: Speaking in full sentences Musculoskeletal:        General: Normal range of motion.     Cervical back: Normal range of motion.  Skin:    Coloration: Skin is not jaundiced or pale.     Findings: No bruising, erythema, lesion or rash.  Neurological:     Mental Status: She is alert and oriented to person, place, and time. Mental status is at baseline.  Psychiatric:        Mood and Affect: Mood normal.        Behavior: Behavior normal.        Thought Content: Thought content normal.        Judgment: Judgment normal.     Results for orders placed or performed  during the hospital encounter of 08/29/23  Basic metabolic panel   Collection Time: 08/29/23 11:49 AM  Result Value Ref Range   Sodium 135 135 - 145 mmol/L   Potassium 4.1 3.5 - 5.1 mmol/L   Chloride 102 98 - 111 mmol/L   CO2 23 22 - 32 mmol/L   Glucose, Bld 87 70 - 99 mg/dL   BUN 13 6 - 20 mg/dL   Creatinine, Ser 9.31 0.44 - 1.00 mg/dL   Calcium 8.8 (L) 8.9 - 10.3 mg/dL   GFR, Estimated >39 >39 mL/min   Anion gap 10 5 - 15  CBC   Collection Time: 08/29/23 11:49 AM  Result Value Ref Range   WBC 8.1 4.0 - 10.5 K/uL   RBC 4.41 3.87 - 5.11 MIL/uL   Hemoglobin 13.2 12.0 - 15.0 g/dL   HCT 59.7 63.9 - 53.9 %   MCV 91.2 80.0 - 100.0 fL   MCH 29.9 26.0 - 34.0 pg   MCHC 32.8 30.0 - 36.0 g/dL   RDW 86.0 88.4 - 84.4 %   Platelets  259 150 - 400 K/uL   nRBC 0.0 0.0 - 0.2 %  Lipase, blood   Collection Time: 08/29/23 11:49 AM  Result Value Ref Range   Lipase 44 11 - 51 U/L  Hepatic function panel   Collection Time: 08/29/23 11:49 AM  Result Value Ref Range   Total Protein 6.8 6.5 - 8.1 g/dL   Albumin 3.6 3.5 - 5.0 g/dL   AST 15 15 - 41 U/L   ALT 11 0 - 44 U/L   Alkaline Phosphatase 77 38 - 126 U/L   Total Bilirubin 0.7 0.0 - 1.2 mg/dL   Bilirubin, Direct <9.8 0.0 - 0.2 mg/dL   Indirect Bilirubin NOT CALCULATED 0.3 - 0.9 mg/dL  Troponin I (High Sensitivity)   Collection Time: 08/29/23 11:49 AM  Result Value Ref Range   Troponin I (High Sensitivity) 3 <18 ng/L  Urinalysis, Routine w reflex microscopic -Urine, Clean Catch   Collection Time: 08/29/23  2:20 PM  Result Value Ref Range   Color, Urine STRAW (A) YELLOW   APPearance CLEAR (A) CLEAR   Specific Gravity, Urine 1.009 1.005 - 1.030   pH 6.0 5.0 - 8.0   Glucose, UA NEGATIVE NEGATIVE mg/dL   Hgb urine dipstick NEGATIVE NEGATIVE   Bilirubin Urine NEGATIVE NEGATIVE   Ketones, ur NEGATIVE NEGATIVE mg/dL   Protein, ur NEGATIVE NEGATIVE mg/dL   Nitrite NEGATIVE NEGATIVE   Leukocytes,Ua NEGATIVE NEGATIVE      Assessment &  Plan:   Problem List Items Addressed This Visit       Cardiovascular and Mediastinum   Migraine - Primary   Not doing well. Has been taking imitrex  and years ago did rizatriptan. Will start her on nurtec. Call with any concerns. Continue to monitor.       Relevant Medications   Rimegepant Sulfate (NURTEC) 75 MG TBDP     Other   Obesity, Class III, BMI 40-49.9 (morbid obesity)   Has been doing diet and exercise without any benefit. Would benefit from wegovy . Rx sent to her pharmacy. Call with any concerns.       Relevant Medications   Semaglutide -Weight Management (WEGOVY ) 0.25 MG/0.5ML SOAJ   Semaglutide -Weight Management (WEGOVY ) 0.5 MG/0.5ML SOAJ (Start on 11/26/2023)   Depression, major, recurrent (HCC)   Not doing well. In exacerbation due to seeing her son's autopsy report. Needs to call her psychiatrist- to do that today. Continue to monitor. Call with any concerns.       Fibromyalgia   Just finished a round of prednisone . Will work on treating other issues. Hold on another round of prednisone  at this time.         Follow up plan: Return in about 2 weeks (around 11/12/2023) for ok to use same day or double book.    This visit was completed via video visit through MyChart due to the restrictions of the COVID-19 pandemic. All issues as above were discussed and addressed. Physical exam was done as above through visual confirmation on video through MyChart. If it was felt that the patient should be evaluated in the office, they were directed there. The patient verbally consented to this visit. Location of the patient: home Location of the provider: work Those involved with this call:  Provider: Duwaine Louder, DO CMA: York Fogo, CMA, Front Desk/Registration: Claretta Maiden  Time spent on call: 25 minutes with patient face to face via video conference. More than 50% of this time was spent in counseling and coordination of care. 40 minutes total spent  in review of patient's  record and preparation of their chart.

## 2023-10-29 NOTE — Assessment & Plan Note (Signed)
 Has been doing diet and exercise without any benefit. Would benefit from wegovy . Rx sent to her pharmacy. Call with any concerns.

## 2023-10-30 ENCOUNTER — Telehealth: Payer: Self-pay

## 2023-10-30 ENCOUNTER — Other Ambulatory Visit (HOSPITAL_COMMUNITY): Payer: Self-pay

## 2023-10-30 NOTE — Telephone Encounter (Signed)
 Pharmacy Patient Advocate Encounter   Received notification from CoverMyMeds that prior authorization for Wegovy  0.25MG /0.5ML auto-injectors is required/requested.   Insurance verification completed.   The patient is insured through [employer] .   Per test claim: Medication is not covered under Medicare Part D. Medication may be covered if pt has experienced any of the following: Myocardial infarction (MI), (2) Prior ischemic or hemorrhagic stroke, (3) Symptomatic peripheral arterial disease (PAD) as evidence by one of the following (a) Intermittent claudication with ankle-brachial index (ABI) less than 0.85 (at rest); (b) Peripheral arterial revascularization procedure; (c) Amputation due to atherosclerotic disease

## 2023-10-31 ENCOUNTER — Other Ambulatory Visit (HOSPITAL_COMMUNITY): Payer: Self-pay

## 2023-11-03 ENCOUNTER — Other Ambulatory Visit (HOSPITAL_COMMUNITY): Payer: Self-pay

## 2023-11-03 NOTE — Telephone Encounter (Signed)
 She has both medicare and medicaid- can we try running it through her medicaid?

## 2023-11-03 NOTE — Telephone Encounter (Signed)
 PA submitted to [place] [facility] via fax

## 2023-11-04 NOTE — Telephone Encounter (Signed)
 This encounter was created in error - please disregard.

## 2023-11-12 ENCOUNTER — Telehealth: Payer: Self-pay

## 2023-11-12 NOTE — Telephone Encounter (Signed)
 Pharmacy Patient Advocate Encounter   Received notification from CoverMyMeds that prior authorization for Nurtec 75MG  dispersible tablets  is required/requested.   Insurance verification completed.   The patient is insured through [employer] .   Per test claim: PA required; PA submitted to above mentioned insurance via Latent Key/confirmation #/[id number] [facility] Status is pending

## 2023-11-18 ENCOUNTER — Ambulatory Visit: Admitting: Family Medicine

## 2023-11-27 ENCOUNTER — Ambulatory Visit: Admitting: Emergency Medicine

## 2023-11-27 VITALS — Ht 61.0 in | Wt 248.0 lb

## 2023-11-27 DIAGNOSIS — Z0000 Encounter for general adult medical examination without abnormal findings: Secondary | ICD-10-CM | POA: Diagnosis not present

## 2023-11-27 NOTE — Patient Instructions (Signed)
 Sydney Valenzuela , Thank you for taking time out of your busy schedule to complete your Annual Wellness Visit with me. I enjoyed our conversation and look forward to speaking with you again next year. I, as well as your care team,  appreciate your ongoing commitment to your health goals. Please review the following plan we discussed and let me know if I can assist you in the future. Your Game plan/ To Do List    Referrals: None  Follow up Visits: We will see or speak with you next year for your Next Medicare AWV with our clinical staff Have you seen your provider in the last 6 months (3 months if uncontrolled diabetes)? Yes  Clinician Recommendations:  Aim for 30 minutes of exercise or brisk walking, 6-8 glasses of water, and 5 servings of fruits and vegetables each day.   Please call to schedule your mammogram:  [facility] Address: [address], [place], [place] Phone: [phone]  [facility] [address], [address] [place], [place] [address] Phone: [phone]        This is a list of the screenings recommended for you:  Health Maintenance  Topic Date Due   COVID-19 Vaccine (1) Never done   Hepatitis B Vaccine (1 of 3 - 19+ 3-dose series) Never done   HPV Vaccine (1 - Risk 3-dose SCDM series) Never done   Pneumococcal Vaccine (2 of 2 - PCV) 08/17/2015   Mammogram  08/28/2023   Flu Shot  06/29/2024*   Medicare Annual Wellness Visit  11/26/2024   Pap with HPV screening  11/30/2025   DTaP/Tdap/Td vaccine (2 - Td or Tdap) 01/29/2027   Hepatitis C Screening  Completed   HIV Screening  Completed   Meningitis B Vaccine  Aged Out  *Topic was postponed. The date shown is not the original due date.    Advanced directives: (ACP Link)Information on Advanced Care Planning can be found at [place]  Secretary of [facility] Advance Health Care Directives Advance Health Care Directives. [email or web address] You may also get the  forms at the doctor's office. Advance Care Planning is important because it:  [x]  Makes sure you receive the medical care that is consistent with your values, goals, and preferences  [x]  It provides guidance to your family and loved ones and reduces their decisional burden about whether or not they are making the right decisions based on your wishes.  Follow the link provided in your after visit summary or read over the paperwork we have mailed to you to help you started getting your Advance Directives in place. If you need assistance in completing these, please reach out to us  so that we can help you!  See attachments for Preventive Care and Fall Prevention Tips.   Fall Prevention in the Home, Adult Falls can cause injuries and affect people of all ages. There are many simple things that you can do to make your home safe and to help prevent falls. If you need it, ask for help making these changes. What actions can I take to prevent falls? General information Use good lighting in all rooms. Make sure to: Replace any light bulbs that burn out. Turn on lights if it is dark and use night-lights. Keep items that you use often in easy-to-reach places. Lower the shelves around your home if needed. Move furniture so that there are clear paths around it. Do not keep throw rugs or other things on the floor  that can make you trip. If any of your floors are uneven, fix them. Add color or contrast paint or tape to clearly mark and help you see: Grab bars or handrails. First and last steps of staircases. Where the edge of each step is. If you use a ladder or stepladder: Make sure that it is fully opened. Do not climb a closed ladder. Make sure the sides of the ladder are locked in place. Have someone hold the ladder while you use it. Know where your pets are as you move through your home. What can I do in the bathroom?     Keep the floor dry. Clean up any water that is on the floor right  away. Remove soap buildup in the bathtub or shower. Buildup makes bathtubs and showers slippery. Use non-skid mats or decals on the floor of the bathtub or shower. Attach bath mats securely with double-sided, non-slip rug tape. If you need to sit down while you are in the shower, use a non-slip stool. Install grab bars by the toilet and in the bathtub and shower. Do not use towel bars as grab bars. What can I do in the bedroom? Make sure that you have a light by your bed that is easy to reach. Do not use any sheets or blankets on your bed that hang to the floor. Have a firm bench or chair with side arms that you can use for support when you get dressed. What can I do in the kitchen? Clean up any spills right away. If you need to reach something above you, use a sturdy step stool that has a grab bar. Keep electrical cables out of the way. Do not use floor polish or wax that makes floors slippery. What can I do with my stairs? Do not leave anything on the stairs. Make sure that you have a light switch at the top and the bottom of the stairs. Have them installed if you do not have them. Make sure that there are handrails on both sides of the stairs. Fix handrails that are broken or loose. Make sure that handrails are as long as the staircases. Install non-slip stair treads on all stairs in your home if they do not have carpet. Avoid having throw rugs at the top or bottom of stairs, or secure the rugs with carpet tape to prevent them from moving. Choose a carpet design that does not hide the edge of steps on the stairs. Make sure that carpet is firmly attached to the stairs. Fix any carpet that is loose or worn. What can I do on the outside of my home? Use bright outdoor lighting. Repair the edges of walkways and driveways and fix any cracks. Clear paths of anything that can make you trip, such as tools or rocks. Add color or contrast paint or tape to clearly mark and help you see high doorway  thresholds. Trim any bushes or trees on the main path into your home. Check that handrails are securely fastened and in good repair. Both sides of all steps should have handrails. Install guardrails along the edges of any raised decks or porches. Have leaves, snow, and ice cleared regularly. Use sand, salt, or ice melt on walkways during winter months if you live where there is ice and snow. In the garage, clean up any spills right away, including grease or oil spills. What other actions can I take? Review your medicines with your health care provider. Some medicines can make you confused  or feel dizzy. This can increase your chance of falling. Wear closed-toe shoes that fit well and support your feet. Wear shoes that have rubber soles and low heels. Use a cane, walker, scooter, or crutches that help you move around if needed. Talk with your provider about other ways that you can decrease your risk of falls. This may include seeing a physical therapist to learn to do exercises to improve movement and strength. Where to find more information Centers for Disease Control and Prevention, [employer]: TonerPromos.no [employer] on Aging: BaseRingTones.pl National Institute on Aging: BaseRingTones.pl Contact a health care provider if: You are afraid of falling at home. You feel weak, drowsy, or dizzy at home. You fall at home. Get help right away if you: Lose consciousness or have trouble moving after a fall. Have a fall that causes a head injury. These symptoms may be an emergency. Get help right away. Call 911. Do not wait to see if the symptoms will go away. Do not drive yourself to the hospital. This information is not intended to replace advice given to you by your health care provider. Make sure you discuss any questions you have with your health care provider. Document Revised: 11/19/2021 Document Reviewed: 11/19/2021 Elsevier Patient Education  2024 [employer].

## 2023-11-27 NOTE — Progress Notes (Signed)
 Subjective:   Sydney Valenzuela is a 43 y.o. who presents for a Medicare Wellness preventive visit.  As a reminder, Annual Wellness Visits don't include a physical exam, and some assessments may be limited, especially if this visit is performed virtually. We may recommend an in-person follow-up visit with your provider if needed.  Visit Complete: Virtual I connected with  Nayomi A Woulfe on 11/27/23 by a audio enabled telemedicine application and verified that I am speaking with the correct person using two identifiers.  Patient Location: Home  Provider Location: Home Office  I discussed the limitations of evaluation and management by telemedicine. The patient expressed understanding and agreed to proceed.  Vital Signs: Because this visit was a virtual/telehealth visit, some criteria may be missing or patient reported. Any vitals not documented were not able to be obtained and vitals that have been documented are patient reported.  VideoDeclined- This patient declined Librarian, academic. Therefore the visit was completed with audio only.  Persons Participating in Visit: Patient.  AWV Questionnaire: No: Patient Medicare AWV questionnaire was not completed prior to this visit.  Cardiac Risk Factors include: obesity (BMI >30kg/m2);dyslipidemia;smoking/ tobacco exposure     Objective:    Today's Vitals   11/27/23 1002 11/27/23 1003  Weight: 248 lb (112.5 kg)   Height: 5' 1 (1.549 m)   PainSc:  6    Body mass index is 46.86 kg/m.     11/27/2023   10:35 AM 08/29/2023   11:47 AM 11/21/2016    6:15 AM 11/19/2016    8:58 AM 11/04/2016   11:59 AM 10/16/2016    1:30 PM  Advanced Directives  Does Patient Have a Medical Advance Directive? No No No  No  No  No   Would patient like information on creating a medical advance directive? Yes (MAU/Ambulatory/Procedural Areas - Information given)  Yes (Inpatient - patient requests chaplain consult to create a medical  advance directive)    No - Patient declined      Data saved with a previous flowsheet row definition    Current Medications (verified) Outpatient Encounter Medications as of 11/27/2023  Medication Sig   albuterol  (VENTOLIN  HFA) 108 (90 Base) MCG/ACT inhaler Inhale 2 puffs into the lungs every 6 (six) hours as needed for wheezing or shortness of breath.   ALPRAZolam (XANAX) 0.5 MG tablet Take 0.5 mg by mouth 2 (two) times daily as needed. (Patient taking differently: Take 0.5 mg by mouth 2 (two) times daily as needed. Takes 1 tablet bid and an additional tablet prn)   baclofen  (LIORESAL ) 10 MG tablet TAKE 1 TABLET BY MOUTH DAILY AT BEDTIME AS NEEDED FOR MUSCLE SPASMS   cyanocobalamin  (VITAMIN B12) 1000 MCG tablet Take 1,000 mcg by mouth daily.   desvenlafaxine (PRISTIQ) 100 MG 24 hr tablet Take 100 mg by mouth daily.   diphenhydrAMINE  HCl (BENADRYL  ALLERGY PO) Take by mouth 2 (two) times daily.   docusate sodium (COLACE) 100 MG capsule Take 100 mg by mouth 2 (two) times daily as needed.   GARLIC 1500 PO Take by mouth.   Ginger, Zingiber officinalis, (GINGER ROOT) 550 MG CAPS Take 550 mg by mouth 2 (two) times daily. TID in the A.M. TID in the P.M.   glucosamine-chondroitin 500-400 MG tablet Take 1 tablet by mouth 3 (three) times daily.   lamoTRIgine (LAMICTAL) 150 MG tablet Take 300 mg by mouth daily at 2 PM.   magnesium gluconate (MAGONATE) 500 MG tablet Take 500 mg by mouth  daily.   Multiple Vitamins-Minerals (MULTIVITAMIN WITH MINERALS) tablet Take 1 tablet by mouth daily.   ondansetron  (ZOFRAN -ODT) 4 MG disintegrating tablet Take 4 mg by mouth every 8 (eight) hours as needed for vomiting.   pantoprazole  (PROTONIX ) 40 MG tablet Take 1 tablet (40 mg total) by mouth 2 (two) times daily.   prazosin (MINIPRESS) 2 MG capsule Take 2 mg by mouth 3 (three) times daily.   pregabalin  (LYRICA ) 100 MG capsule Take 150mg  in the AM, 150mg  in the PM and 2 tabs at bedtime to be taken with her 50mg  tabs in  the AM and PM for 150mg  total   pregabalin  (LYRICA ) 50 MG capsule Take 1 capsule (50 mg total) by mouth 2 (two) times daily. With her 100mg  for 150mg  total   Rimegepant Sulfate (NURTEC) 75 MG TBDP Take 1 tablet (75 mg total) by mouth daily as needed.   tiZANidine  (ZANAFLEX ) 4 MG tablet Take 1 tablet (4 mg total) by mouth every 6 (six) hours as needed for muscle spasms. Take 1 tablet (4 mg total) by mouth every 6 (six) hours as needed for muscle spasms. OK to take 2 at bedtime   Turmeric (QC TUMERIC COMPLEX PO) Take 1,500 mg by mouth at bedtime.   acetaminophen  (TYLENOL ) 500 MG tablet Take 500-1,000 mg by mouth daily as needed for moderate pain or headache. (Patient not taking: Reported on 11/27/2023)   albuterol  (PROVENTIL ) (2.5 MG/3ML) 0.083% nebulizer solution Take 3 mLs (2.5 mg total) by nebulization every 6 (six) hours as needed for wheezing or shortness of breath. (Patient not taking: Reported on 11/27/2023)   famotidine  (PEPCID ) 20 MG tablet Take 1 tablet (20 mg total) by mouth 2 (two) times daily. (Patient not taking: Reported on 11/27/2023)   lidocaine  (XYLOCAINE ) 2 % solution Use as directed 15 mLs in the mouth or throat every 4 (four) hours as needed for mouth pain. (Patient not taking: Reported on 11/27/2023)   methocarbamol  (ROBAXIN ) 500 MG tablet Take 1 tablet (500 mg total) by mouth every 8 (eight) hours as needed for muscle spasms. (Patient not taking: Reported on 11/27/2023)   predniSONE  (DELTASONE ) 10 MG tablet 6 tabs days 1 and 2 in the AM with food, 5 pills days 3 and 4, decrease by 1 every other day until gone. (Patient not taking: Reported on 11/27/2023)   Semaglutide -Weight Management (WEGOVY ) 0.25 MG/0.5ML SOAJ Inject 0.25 mg into the skin once a week. (Patient not taking: Reported on 11/27/2023)   Semaglutide -Weight Management (WEGOVY ) 0.5 MG/0.5ML SOAJ Inject 0.5 mg into the skin once a week. (Patient not taking: Reported on 11/27/2023)   Spinosad  0.9 % SUSP 1 application- leave on 10  minutes, may repeat x1 after 7 days if live lice persist (Patient not taking: Reported on 11/27/2023)   SUMAtriptan  (IMITREX ) 100 MG tablet Take 1 tablet (100 mg total) by mouth once for 1 dose. May repeat in 2 hours if headache persists or recurs. (Patient not taking: Reported on 11/27/2023)   No facility-administered encounter medications on file as of 11/27/2023.    Allergies (verified) Codeine, Hydrocodone , and Oxycodone    History: Past Medical History:  Diagnosis Date   Allergy    Anxiety    Arthritis    Family history of adverse reaction to anesthesia    N/V, difficult to wake up   Hiatal hernia    Hypertension    Past Surgical History:  Procedure Laterality Date   CHOLECYSTECTOMY N/A 11/21/2016   Procedure: LAPAROSCOPIC CHOLECYSTECTOMY WITH INTRAOPERATIVE CHOLANGIOGRAM;  Surgeon:  Dellie Louanne MATSU, MD;  Location: [facility] ORS;  Service: General;  Laterality: N/A;   ESOPHAGOGASTRODUODENOSCOPY (EGD) WITH PROPOFOL  N/A 11/04/2016   Procedure: ESOPHAGOGASTRODUODENOSCOPY (EGD) WITH PROPOFOL ;  Surgeon: Viktoria Lamar DASEN, MD;  Location: [facility] ENDOSCOPY;  Service: Endoscopy;  Laterality: N/A;   LEEP     NASAL SINUS SURGERY     TONSILLECTOMY     Family History  Problem Relation Age of Onset   Hypertension Mother    Allergies Mother    Hyperlipidemia Mother    Anxiety disorder Daughter    Cancer Maternal Uncle        prostate   Diabetes Maternal Uncle    Heart disease Maternal Uncle    Allergies Maternal Grandmother    COPD Maternal Grandmother    Alzheimer's disease Maternal Grandmother    Diabetes Maternal Grandfather    Heart disease Maternal Grandfather    Hypertension Son    Breast cancer Neg Hx    Social History   Socioeconomic History   Marital status: Married    Spouse name: Coy   Number of children: 2   Years of education: Not on file   Highest education level: Not on file  Occupational History   Not on file  Tobacco Use   Smoking status: Former    Current  packs/day: 0.00    Average packs/day: 0.5 packs/day for 13.0 years (6.5 ttl pk-yrs)    Types: Cigarettes    Start date: 2010    Quit date: 2023    Years since quitting: 2.6   Smokeless tobacco: Never  Vaping Use   Vaping status: Every Day   Substances: Nicotine , Flavoring  Substance and Sexual Activity   Alcohol use: Yes    Comment: occasional, 1 beer every 3 months   Drug use: No   Sexual activity: Yes    Birth control/protection: I.U.D.  Other Topics Concern   Not on file  Social History Narrative   December 10, 2023 1 son deceased and 1 daughter living   Social Drivers of Corporate investment banker Strain: Low Risk  (10-Dec-2023)   Overall Financial Resource Strain (CARDIA)    Difficulty of Paying Living Expenses: Not hard at all  Food Insecurity: No Food Insecurity (December 10, 2023)   Hunger Vital Sign    Worried About Running Out of Food in the Last Year: Never true    Ran Out of Food in the Last Year: Never true  Transportation Needs: No Transportation Needs (10-Dec-2023)   PRAPARE - Administrator, Civil Service (Medical): No    Lack of Transportation (Non-Medical): No  Physical Activity: Inactive (10-Dec-2023)   Exercise Vital Sign    Days of Exercise per Week: 0 days    Minutes of Exercise per Session: 0 min  Stress: Stress Concern Present (12/10/2023)   [employer] of Occupational Health - Occupational Stress Questionnaire    Feeling of Stress: Very much  Social Connections: Moderately Integrated (2023/12/10)   Social Connection and Isolation Panel    Frequency of Communication with Friends and Family: More than three times a week    Frequency of Social Gatherings with Friends and Family: Twice a week    Attends Religious Services: 1 to 4 times per year    Active Member of [employer] or Organizations: No    Attends Banker Meetings: Never    Marital Status: Married    Tobacco Counseling Counseling given: Not Answered    Clinical Intake:  Pre-visit  preparation completed: Yes  Pain : 0-10 Pain Score: 6  Pain Type: Chronic pain Pain Location: Knee Pain Orientation: Right Pain Descriptors / Indicators: Aching     BMI - recorded: 46.86 Nutritional Status: BMI > 30  Obese Nutritional Risks: Nausea/ vomitting/ diarrhea (due to nerves) Diabetes: No  Lab Results  Component Value Date   HGBA1C 5.5 07/01/2023   HGBA1C 5.7 (H) 11/25/2022     How often do you need to have someone help you when you read instructions, pamphlets, or other written materials from your doctor or pharmacy?: 1 - Never  Interpreter Needed?: No  Information entered by :: Vina Ned, CMA   Activities of Daily Living     11/27/2023   10:10 AM  In your present state of health, do you have any difficulty performing the following activities:  Hearing? 0  Vision? 0  Difficulty concentrating or making decisions? 1  Comment brain fog  Walking or climbing stairs? 1  Comment uses cane prn  Dressing or bathing? 1  Comment sometimes husband has to help  Doing errands, shopping? 1  Comment some days needs someone to help  Preparing Food and eating ? N  Using the Toilet? N  In the past six months, have you accidently leaked urine? Y  Comment wears pads  Do you have problems with loss of bowel control? Y  Comment sometimes, wears pad  Managing your Medications? N  Managing your Finances? Y  Comment manages finances with husband  Housekeeping or managing your Housekeeping? Y  Comment husband does housekeeping    Patient Care Team: Vicci Duwaine SQUIBB, DO as PCP - General (Family Medicine) Lona Devere Dancer, FNP (Psychiatry)  I have updated your Care Teams any recent Medical Services you may have received from other providers in the past year.     Assessment:   This is a routine wellness examination for [place].  Hearing/Vision screen Hearing Screening - Comments:: Denies hearing loss  Vision Screening - Comments:: Routine eye exam scheduled  01/2024, [facility] [place]   Goals Addressed             This Visit's Progress    Patient Stated       Lose weight and get knees and back taken care of.       Depression Screen     11/27/2023   10:29 AM 10/29/2023    8:46 AM 10/14/2023    1:17 PM 09/08/2023    2:00 PM 07/24/2023    3:11 PM 07/01/2023    4:33 PM 06/19/2023    9:19 AM  PHQ 2/9 Scores  PHQ - 2 Score 6 6 6 5 4 5 6   PHQ- 9 Score 21 27 23 14 17 20 25     Fall Risk     11/27/2023   10:37 AM 10/14/2023    1:16 PM 06/19/2023    9:19 AM 05/21/2023   10:26 AM 05/20/2023    9:29 AM  Fall Risk   Falls in the past year? 1 1 1 1  0  Number falls in past yr: 1 1 0 1 0  Injury with Fall? 1 1 1 1  0  Risk for fall due to : History of fall(s);Impaired balance/gait;Orthopedic patient;Impaired mobility History of fall(s);Impaired balance/gait;Impaired mobility No Fall Risks Impaired mobility;Impaired balance/gait;History of fall(s) No Fall Risks  Follow up Falls evaluation completed;Education provided Falls evaluation completed Falls evaluation completed Falls evaluation completed Falls evaluation completed    MEDICARE RISK AT HOME:  Medicare Risk at Home Any  stairs in or around the home?: Yes (2 steps) If so, are there any without handrails?: Yes Home free of loose throw rugs in walkways, pet beds, electrical cords, etc?: Yes Adequate lighting in your home to reduce risk of falls?: Yes Life alert?: No Use of a cane, walker or w/c?: Yes (cane prn) Grab bars in the bathroom?: No Shower chair or bench in shower?: No Elevated toilet seat or a handicapped toilet?: Yes  TIMED UP AND GO:  Was the test performed?  No  Cognitive Function: 6CIT completed        11/27/2023   10:39 AM 11/25/2022    3:47 PM  6CIT Screen  What Year? 0 points 0 points  What month? 0 points 0 points  What time? 0 points 0 points  Count back from 20 0 points 0 points  Months in reverse 0 points 0 points  Repeat phrase 0 points 2 points   Total Score 0 points 2 points    Immunizations Immunization History  Administered Date(s) Administered   Influenza,inj,Quad PF,6+ Mos 01/28/2017, 03/11/2018   PPD Test 06/05/2017   Pneumococcal Polysaccharide-23 08/17/2014   Tdap 01/28/2017    Screening Tests Health Maintenance  Topic Date Due   COVID-19 Vaccine (1) Never done   Hepatitis B Vaccines 19-59 Average Risk (1 of 3 - 19+ 3-dose series) Never done   HPV VACCINES (1 - Risk 3-dose SCDM series) Never done   Pneumococcal Vaccine (2 of 2 - PCV) 08/17/2015   MAMMOGRAM  08/28/2023   INFLUENZA VACCINE  06/29/2024 (Originally 10/31/2023)   Medicare Annual Wellness (AWV)  11/26/2024   Cervical Cancer Screening (HPV/Pap Cotest)  11/30/2025   DTaP/Tdap/Td (2 - Td or Tdap) 01/29/2027   Hepatitis C Screening  Completed   HIV Screening  Completed   Meningococcal B Vaccine  Aged Out    Health Maintenance  Health Maintenance Due  Topic Date Due   COVID-19 Vaccine (1) Never done   Hepatitis B Vaccines 19-59 Average Risk (1 of 3 - 19+ 3-dose series) Never done   HPV VACCINES (1 - Risk 3-dose SCDM series) Never done   Pneumococcal Vaccine (2 of 2 - PCV) 08/17/2015   MAMMOGRAM  08/28/2023   Health Maintenance Items Addressed: See Nurse Notes at the end of this note  Additional Screening:  Vision Screening: Recommended annual ophthalmology exams for early detection of glaucoma and other disorders of the eye. Would you like a referral to an eye doctor? No    Dental Screening: Recommended annual dental exams for proper oral hygiene  Community Resource Referral / Chronic Care Management: CRR required this visit?  No   CCM required this visit?  No   Plan:    I have personally reviewed and noted the following in the patient's chart:   Medical and social history Use of alcohol, tobacco or illicit drugs  Current medications and supplements including opioid prescriptions. Patient is not currently taking opioid  prescriptions. Functional ability and status Nutritional status Physical activity Advanced directives List of other physicians Hospitalizations, surgeries, and ER visits in previous 12 months Vitals Screenings to include cognitive, depression, and falls Referrals and appointments  In addition, I have reviewed and discussed with patient certain preventive protocols, quality metrics, and best practice recommendations. A written personalized care plan for preventive services as well as general preventive health recommendations were provided to patient.   Vina Ned, CMA   11/27/2023   After Visit Summary: (MyChart) Due to this being a telephonic  visit, the after visit summary with patients personalized plan was offered to patient via MyChart   Notes:  6 CIT Score - 21 (sees Psychiatry) Declined Pneumonia, flu and covid vaccines Gave ph# to call and scheduled [facility] (ordered 10/14/23)

## 2023-12-02 ENCOUNTER — Ambulatory Visit: Admitting: Cardiovascular Disease

## 2023-12-03 ENCOUNTER — Ambulatory Visit (INDEPENDENT_AMBULATORY_CARE_PROVIDER_SITE_OTHER): Admitting: Family Medicine

## 2023-12-03 ENCOUNTER — Encounter: Payer: Self-pay | Admitting: Family Medicine

## 2023-12-03 ENCOUNTER — Other Ambulatory Visit: Payer: Self-pay

## 2023-12-03 VITALS — BP 131/88 | HR 83 | Ht 61.0 in | Wt 243.6 lb

## 2023-12-03 DIAGNOSIS — F332 Major depressive disorder, recurrent severe without psychotic features: Secondary | ICD-10-CM
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
## 2023-12-04 ENCOUNTER — Encounter: Payer: Self-pay | Admitting: Family Medicine

## 2023-12-04 ENCOUNTER — Other Ambulatory Visit: Payer: Self-pay

## 2023-12-04 NOTE — Assessment & Plan Note (Signed)
 Not doing well. Significant social stressors that have been worsening over the past few weeks. She had an episode of fleeting suicidality, but is not feeling that way now. She will continue to follow with psychiatry and counseling. Discussed warning signs to go to ER. She will monitor closely. Continue to monitor.

## 2023-12-08 ENCOUNTER — Encounter: Payer: Self-pay | Admitting: Cardiology

## 2023-12-08 ENCOUNTER — Ambulatory Visit: Attending: Cardiology | Admitting: Cardiology

## 2023-12-08 ENCOUNTER — Other Ambulatory Visit: Payer: Self-pay

## 2023-12-08 VITALS — BP 125/90 | HR 70 | Ht 61.0 in | Wt 241.4 lb

## 2023-12-08 DIAGNOSIS — E66813 Obesity, class 3: Secondary | ICD-10-CM | POA: Diagnosis not present

## 2023-12-08 DIAGNOSIS — R072 Precordial pain: Secondary | ICD-10-CM

## 2023-12-08 DIAGNOSIS — E7800 Pure hypercholesterolemia, unspecified: Secondary | ICD-10-CM | POA: Diagnosis not present

## 2023-12-08 MED ORDER — METOPROLOL TARTRATE 100 MG PO TABS: ORAL_TABLET | ORAL | 0 refills | Status: DC

## 2023-12-08 NOTE — Progress Notes (Signed)
 Cardiology Office Note:    Date:  12/08/2023   ID:  Sydney Valenzuela, DOB Sep 24, 1980, MRN [id number]  PCP:  Vicci Duwaine SQUIBB, DO    HeartCare Providers Cardiologist:  None     Referring MD: Vicci Duwaine SQUIBB, DO   Chief Complaint  Patient presents with   New Patient (Initial Visit)    Pt continue occasional but not the same , lost appetite, tiredness, weaknesses, bad swearing.     History of Present Illness:    Sydney Valenzuela is a 43 y.o. female with a hx of hypertension, hyperlipidemia, former smoker x 15 years, depression, obesity who presents due to chest pain.  Patient Significant chest pressure of 4 months ago after having back surgery.  Symptoms were associated with diaphoresis.  Presented to the ED where workup was unrevealing.  She has had occasional sharp left-sided chest discomfort over the years.  Her mother had a heart attack in her 59s.  Recently started on Wegovy  to help with weight loss.  Not on BP or cholesterol meds, hoping with weight loss and healthy diet, BP and cholesterol will improve.  Past Medical History:  Diagnosis Date   Allergy    Anxiety    Arthritis    Family history of adverse reaction to anesthesia    N/V, difficult to wake up   Hiatal hernia    Hypertension     Past Surgical History:  Procedure Laterality Date   CHOLECYSTECTOMY N/A 11/21/2016   Procedure: LAPAROSCOPIC CHOLECYSTECTOMY WITH INTRAOPERATIVE CHOLANGIOGRAM;  Surgeon: Dellie Louanne MATSU, MD;  Location: [facility] ORS;  Service: General;  Laterality: N/A;   ESOPHAGOGASTRODUODENOSCOPY (EGD) WITH PROPOFOL  N/A 11/04/2016   Procedure: ESOPHAGOGASTRODUODENOSCOPY (EGD) WITH PROPOFOL ;  Surgeon: Viktoria Lamar DASEN, MD;  Location: [facility] ENDOSCOPY;  Service: Endoscopy;  Laterality: N/A;   LEEP     NASAL SINUS SURGERY     TONSILLECTOMY      Current Medications: Current Meds  Medication Sig   acetaminophen  (TYLENOL ) 500 MG tablet Take 500-1,000 mg by mouth daily as needed for moderate  pain or headache.   albuterol  (PROVENTIL ) (2.5 MG/3ML) 0.083% nebulizer solution Take 3 mLs (2.5 mg total) by nebulization every 6 (six) hours as needed for wheezing or shortness of breath.   albuterol  (VENTOLIN  HFA) 108 (90 Base) MCG/ACT inhaler Inhale 2 puffs into the lungs every 6 (six) hours as needed for wheezing or shortness of breath.   ALPRAZolam (XANAX) 0.5 MG tablet Take 0.5 mg by mouth 2 (two) times daily as needed. (Patient taking differently: Take 0.5 mg by mouth 2 (two) times daily as needed. Takes 1 tablet bid and an additional tablet prn)   baclofen  (LIORESAL ) 10 MG tablet TAKE 1 TABLET BY MOUTH DAILY AT BEDTIME AS NEEDED FOR MUSCLE SPASMS   cyanocobalamin  (VITAMIN B12) 1000 MCG tablet Take 1,000 mcg by mouth daily.   desvenlafaxine (PRISTIQ) 100 MG 24 hr tablet Take 100 mg by mouth daily.   diphenhydrAMINE  HCl (BENADRYL  ALLERGY PO) Take by mouth 2 (two) times daily.   docusate sodium (COLACE) 100 MG capsule Take 100 mg by mouth 2 (two) times daily as needed.   GARLIC 1500 PO Take by mouth.   Ginger, Zingiber officinalis, (GINGER ROOT) 550 MG CAPS Take 550 mg by mouth 2 (two) times daily. TID in the A.M. TID in the P.M.   glucosamine-chondroitin 500-400 MG tablet Take 1 tablet by mouth 3 (three) times daily.   lidocaine  (XYLOCAINE ) 2 % solution Use as directed 15 mLs in  the mouth or throat every 4 (four) hours as needed for mouth pain.   magnesium gluconate (MAGONATE) 500 MG tablet Take 500 mg by mouth daily.   metoprolol  tartrate (LOPRESSOR ) 100 MG tablet TAKE 1 TABLET 2 HR PRIOR TO CARDIAC PROCEDURE   Multiple Vitamins-Minerals (MULTIVITAMIN WITH MINERALS) tablet Take 1 tablet by mouth daily.   ondansetron  (ZOFRAN -ODT) 4 MG disintegrating tablet Take 4 mg by mouth every 8 (eight) hours as needed for vomiting.   pantoprazole  (PROTONIX ) 40 MG tablet Take 1 tablet (40 mg total) by mouth 2 (two) times daily.   prazosin (MINIPRESS) 2 MG capsule Take 2 mg by mouth 3 (three) times daily.    pregabalin  (LYRICA ) 100 MG capsule Take 150mg  in the AM, 150mg  in the PM and 2 tabs at bedtime to be taken with her 50mg  tabs in the AM and PM for 150mg  total   pregabalin  (LYRICA ) 50 MG capsule Take 1 capsule (50 mg total) by mouth 2 (two) times daily. With her 100mg  for 150mg  total   Rimegepant Sulfate (NURTEC) 75 MG TBDP Take 1 tablet (75 mg total) by mouth daily as needed.   semaglutide -weight management (WEGOVY ) 0.25 MG/0.5ML SOAJ SQ injection Inject 0.25 mg into the skin once a week.   Semaglutide -Weight Management (WEGOVY ) 0.5 MG/0.5ML SOAJ Inject 0.5 mg into the skin once a week.   tiZANidine  (ZANAFLEX ) 4 MG tablet Take 1 tablet (4 mg total) by mouth every 6 (six) hours as needed for muscle spasms. Take 1 tablet (4 mg total) by mouth every 6 (six) hours as needed for muscle spasms. OK to take 2 at bedtime   Turmeric (QC TUMERIC COMPLEX PO) Take 1,500 mg by mouth at bedtime.     Allergies:   Codeine, Hydrocodone , and Oxycodone    Social History   Socioeconomic History   Marital status: Married    Spouse name: Coy   Number of children: 2   Years of education: Not on file   Highest education level: Not on file  Occupational History   Not on file  Tobacco Use   Smoking status: Former    Current packs/day: 0.00    Average packs/day: 0.5 packs/day for 13.0 years (6.5 ttl pk-yrs)    Types: Cigarettes    Start date: 2010    Quit date: 2023    Years since quitting: 2.6   Smokeless tobacco: Never  Vaping Use   Vaping status: Every Day   Substances: Nicotine , Flavoring  Substance and Sexual Activity   Alcohol use: Yes    Comment: occasional, 1 beer every 3 months   Drug use: No   Sexual activity: Yes    Birth control/protection: I.U.D.  Other Topics Concern   Not on file  Social History Narrative   12/01/2023 1 son deceased and 1 daughter living   Social Drivers of Corporate investment banker Strain: Low Risk  (December 01, 2023)   Overall Financial Resource Strain (CARDIA)     Difficulty of Paying Living Expenses: Not hard at all  Food Insecurity: No Food Insecurity (12-01-2023)   Hunger Vital Sign    Worried About Running Out of Food in the Last Year: Never true    Ran Out of Food in the Last Year: Never true  Transportation Needs: No Transportation Needs (Dec 01, 2023)   PRAPARE - Administrator, Civil Service (Medical): No    Lack of Transportation (Non-Medical): No  Physical Activity: Inactive (12-01-23)   Exercise Vital Sign    Days of Exercise  per Week: 0 days    Minutes of Exercise per Session: 0 min  Stress: Stress Concern Present (11/27/2023)   [employer] of Occupational Health - Occupational Stress Questionnaire    Feeling of Stress: Very much  Social Connections: Moderately Integrated (11/27/2023)   Social Connection and Isolation Panel    Frequency of Communication with Friends and Family: More than three times a week    Frequency of Social Gatherings with Friends and Family: Twice a week    Attends Religious Services: 1 to 4 times per year    Active Member of [employer] or Organizations: No    Attends Engineer, structural: Never    Marital Status: Married     Family History: The patient's family history includes Allergies in her maternal grandmother and mother; Alzheimer's disease in her maternal grandmother; Anxiety disorder in her daughter; COPD in her maternal grandmother; Cancer in her maternal uncle; Diabetes in her maternal grandfather and maternal uncle; Heart disease in her maternal grandfather and maternal uncle; Hyperlipidemia in her mother; Hypertension in her mother and son. There is no history of Breast cancer.  ROS:   Please see the history of present illness.     All other systems reviewed and are negative.  EKGs/Labs/Other Studies Reviewed:    The following studies were reviewed today:  EKG Interpretation Date/Time:  Monday December 08 2023 09:59:39 EDT Ventricular Rate:  70 PR Interval:  148 QRS  Duration:  74 QT Interval:  358 QTC Calculation: 386 R Axis:   10  Text Interpretation: Normal sinus rhythm Normal ECG Confirmed by Darliss Rogue ([id number]) on 12/08/2023 10:06:17 AM    Recent Labs: 08/29/2023: ALT 11; BUN 13; Creatinine, Ser 0.68; Hemoglobin 13.2; Platelets 259; Potassium 4.1; Sodium 135  Recent Lipid Panel    Component Value Date/Time   CHOL 253 (H) 07/01/2023 1620   TRIG 524 (H) 07/01/2023 1620   HDL 67 07/01/2023 1620   LDLCALC 99 07/01/2023 1620     Risk Assessment/Calculations:          Physical Exam:    VS:  BP (!) 125/90 (BP Location: Left Arm, Patient Position: Sitting, Cuff Size: Normal)   Pulse 70   Ht 5' 1 (1.549 m)   Wt 241 lb 6.4 oz (109.5 kg)   SpO2 93%   BMI 45.61 kg/m     Wt Readings from Last 3 Encounters:  12/08/23 241 lb 6.4 oz (109.5 kg)  12/03/23 243 lb 9.6 oz (110.5 kg)  11/27/23 248 lb (112.5 kg)     GEN:  Well nourished, well developed in no acute distress HEENT: Normal NECK: No JVD; No carotid bruits CARDIAC: RRR, no murmurs, rubs, gallops RESPIRATORY:  Clear to auscultation without rales, wheezing or rhonchi  ABDOMEN: Soft, non-tender, non-distended MUSCULOSKELETAL:  No edema; No deformity  SKIN: Warm and dry NEUROLOGIC:  Alert and oriented x 3 PSYCHIATRIC:  Normal affect   ASSESSMENT:    1. Precordial pain   2. Pure hypercholesterolemia   3. Obesity, Class III, BMI 40-49.9 (morbid obesity)    PLAN:    In order of problems listed above:  Chest pain, several risk factors.  Get echo monitor coronary CTA. Hyperlipidemia, low-carb cholesterol diet advised.  Hopefully weight loss will help with cholesterol levels. Morbid obesity, low-calorie diet, weight loss advised.  Wegovy .  Follow-up after cardiac testing.     Medication Adjustments/Labs and Tests Ordered: Current medicines are reviewed at length with the patient today.  Concerns regarding medicines are outlined  above.  Orders Placed This Encounter   Procedures   CT CORONARY MORPH W/CTA COR W/SCORE W/CA W/CM &/OR WO/CM   Basic metabolic panel with GFR   EKG 87-Ozji   ECHOCARDIOGRAM COMPLETE   Meds ordered this encounter  Medications   metoprolol  tartrate (LOPRESSOR ) 100 MG tablet    Sig: TAKE 1 TABLET 2 HR PRIOR TO CARDIAC PROCEDURE    Dispense:  1 tablet    Refill:  0    Patient Instructions  Medication Instructions:  - Take metoprolol  100 mg two hours prior to cardiac CT *If you need a refill on your cardiac medications before your next appointment, please call your pharmacy*  Lab Work: Your provider would like for you to have following labs drawn today BMP.   If you have labs (blood work) drawn today and your tests are completely normal, you will receive your results only by: MyChart Message (if you have MyChart) OR A paper copy in the mail If you have any lab test that is abnormal or we need to change your treatment, we will call you to review the results.  Testing/Procedures: Your physician has requested that you have an echocardiogram. Echocardiography is a painless test that uses sound waves to create images of your heart. It provides your doctor with information about the size and shape of your heart and how well your heart's chambers and valves are working.   You may receive an ultrasound enhancing agent through an IV if needed to better visualize your heart during the echo. This procedure takes approximately one hour.  There are no restrictions for this procedure.  This will take place at [address] [facility] [address] [facility], [place] [address]  Please note: We ask at that you not bring children with you during ultrasound (echo/ vascular) testing. Due to room size and safety concerns, children are not allowed in the ultrasound rooms during exams. Our front office staff cannot provide observation of children in our lobby area while testing is being conducted. An adult accompanying a patient to their  appointment will only be allowed in the ultrasound room at the discretion of the ultrasound technician under special circumstances. We apologize for any inconvenience.   Your cardiac CT will be scheduled at:  [facility] [address] [place], [place] [address] [phone]  Please arrive 15 mins early for check-in and test prep.  There is spacious parking and easy access to the radiology department from the [facility] Heart and Vascular entrance. Please enter here and check-in with the desk attendant.    Please follow these instructions carefully (unless otherwise directed):  An IV will be required for this test and Nitroglycerin will be given.  Hold all erectile dysfunction medications at least 3 days (72 hrs) prior to test. (Ie viagra, cialis, sildenafil, tadalafil, etc)     On the Night Before the Test: Be sure to Drink plenty of water. Do not consume any caffeinated/decaffeinated beverages or chocolate 12 hours prior to your test. Do not take any antihistamines 12 hours prior to your test.  On the Day of the Test: Drink plenty of water until 1 hour prior to the test. Do not eat any food 1 hour prior to test. You may take your regular medications prior to the test.  Take metoprolol  (Lopressor ) two hours prior to test. If you take Furosemide/Hydrochlorothiazide/Spironolactone/Chlorthalidone, please HOLD on the morning of the test. Patients who wear a continuous glucose monitor MUST remove the device prior to  scanning. FEMALES- please wear underwire-free bra if available, avoid dresses & tight clothing       After the Test: Drink plenty of water. After receiving IV contrast, you may experience a mild flushed feeling. This is normal. On occasion, you may experience a mild rash up to 24 hours after the test. This is not dangerous. If this occurs, you can take Benadryl  25 mg, Zyrtec , Claritin, or Allegra and increase your fluid intake. (Patients taking  Tikosyn should avoid Benadryl , and may take Zyrtec , Claritin, or Allegra) If you experience trouble breathing, this can be serious. If it is severe call 911 IMMEDIATELY. If it is mild, please call our office.  We will call to schedule your test 2-4 weeks out understanding that some insurance companies will need an authorization prior to the service being performed.   For more information and frequently asked questions, please visit our website : [email or web address]  For non-scheduling related questions, please contact the cardiac imaging nurse navigator should you have any questions/concerns: Cardiac Imaging Nurse Navigators Direct Office Dial: [phone]   For scheduling needs, including cancellations and rescheduling, please call [place], [phone].    Follow-Up: At [facility], you and your health needs are our priority.  As part of our continuing mission to provide you with exceptional heart care, our providers are all part of one team.  This team includes your primary Cardiologist (physician) and Advanced Practice Providers or APPs (Physician Assistants and Nurse Practitioners) who all work together to provide you with the care you need, when you need it.  Your next appointment:   3 month(s)  Provider:   You may see Dr. Darliss or one of the following Advanced Practice Providers on your designated Care Team:   Lonni Meager, NP Lesley Maffucci, PA-C Bernardino Bring, PA-C Cadence [place], PA-C Tylene Lunch, NP Barnie Hila, NP    We recommend signing up for the patient portal called MyChart.  Sign up information is provided on this After Visit Summary.  MyChart is used to connect with patients for Virtual Visits (Telemedicine).  Patients are able to view lab/test results, encounter notes, upcoming appointments, etc.  Non-urgent messages can be sent to your provider as well.   To learn more about what you can do with MyChart, go to  [email or web address].          Signed, Redell Darliss, MD  12/08/2023 10:43 AM    [facility]

## 2023-12-08 NOTE — Patient Outreach (Signed)
 LCSW called patient in attempt to speak with patient in regard to an urgent referral that was received. LCSW was unable to reach patient and could not leave voice mail. LCSW set another appointment for 12/11/2023 at 11:00 am.  Olam Ally, MSW, LCSW [place]  [facility], [facility] Licensed Clinical Social Worker Direct Dial: [phone]

## 2023-12-08 NOTE — Patient Instructions (Signed)
 Medication Instructions:  - Take metoprolol  100 mg two hours prior to cardiac CT *If you need a refill on your cardiac medications before your next appointment, please call your pharmacy*  Lab Work: Your provider would like for you to have following labs drawn today BMP.   If you have labs (blood work) drawn today and your tests are completely normal, you will receive your results only by: MyChart Message (if you have MyChart) OR A paper copy in the mail If you have any lab test that is abnormal or we need to change your treatment, we will call you to review the results.  Testing/Procedures: Your physician has requested that you have an echocardiogram. Echocardiography is a painless test that uses sound waves to create images of your heart. It provides your doctor with information about the size and shape of your heart and how well your heart's chambers and valves are working.   You may receive an ultrasound enhancing agent through an IV if needed to better visualize your heart during the echo. This procedure takes approximately one hour.  There are no restrictions for this procedure.  This will take place at [address] [facility] [address]) [address], [place] [address]  Please note: We ask at that you not bring children with you during ultrasound (echo/ vascular) testing. Due to room size and safety concerns, children are not allowed in the ultrasound rooms during exams. Our front office staff cannot provide observation of children in our lobby area while testing is being conducted. An adult accompanying a patient to their appointment will only be allowed in the ultrasound room at the discretion of the ultrasound technician under special circumstances. We apologize for any inconvenience.   Your cardiac CT will be scheduled at:  [facility] [address] [place], [place] [address] [phone]  Please arrive 15 mins early for check-in and test  prep.  There is spacious parking and easy access to the radiology department from the [facility] Heart and Vascular entrance. Please enter here and check-in with the desk attendant.    Please follow these instructions carefully (unless otherwise directed):  An IV will be required for this test and Nitroglycerin will be given.  Hold all erectile dysfunction medications at least 3 days (72 hrs) prior to test. (Ie viagra, cialis, sildenafil, tadalafil, etc)     On the Night Before the Test: Be sure to Drink plenty of water. Do not consume any caffeinated/decaffeinated beverages or chocolate 12 hours prior to your test. Do not take any antihistamines 12 hours prior to your test.  On the Day of the Test: Drink plenty of water until 1 hour prior to the test. Do not eat any food 1 hour prior to test. You may take your regular medications prior to the test.  Take metoprolol  (Lopressor ) two hours prior to test. If you take Furosemide/Hydrochlorothiazide/Spironolactone/Chlorthalidone, please HOLD on the morning of the test. Patients who wear a continuous glucose monitor MUST remove the device prior to scanning. FEMALES- please wear underwire-free bra if available, avoid dresses & tight clothing       After the Test: Drink plenty of water. After receiving IV contrast, you may experience a mild flushed feeling. This is normal. On occasion, you may experience a mild rash up to 24 hours after the test. This is not dangerous. If this occurs, you can take Benadryl  25 mg, Zyrtec , Claritin, or Allegra and increase your fluid intake. (Patients taking Tikosyn should avoid Benadryl , and  may take Zyrtec , Claritin, or Allegra) If you experience trouble breathing, this can be serious. If it is severe call 911 IMMEDIATELY. If it is mild, please call our office.  We will call to schedule your test 2-4 weeks out understanding that some insurance companies will need an authorization prior to the service being  performed.   For more information and frequently asked questions, please visit our website : [email or web address]  For non-scheduling related questions, please contact the cardiac imaging nurse navigator should you have any questions/concerns: Cardiac Imaging Nurse Navigators Direct Office Dial: [phone]   For scheduling needs, including cancellations and rescheduling, please call [place], [phone].    Follow-Up: At [facility], you and your health needs are our priority.  As part of our continuing mission to provide you with exceptional heart care, our providers are all part of one team.  This team includes your primary Cardiologist (physician) and Advanced Practice Providers or APPs (Physician Assistants and Nurse Practitioners) who all work together to provide you with the care you need, when you need it.  Your next appointment:   3 month(s)  Provider:   You may see Dr. Darliss or one of the following Advanced Practice Providers on your designated Care Team:   Lonni Meager, NP Lesley Maffucci, PA-C Bernardino Bring, PA-C Cadence [place], PA-C Tylene Lunch, NP Barnie Hila, NP    We recommend signing up for the patient portal called MyChart.  Sign up information is provided on this After Visit Summary.  MyChart is used to connect with patients for Virtual Visits (Telemedicine).  Patients are able to view lab/test results, encounter notes, upcoming appointments, etc.  Non-urgent messages can be sent to your provider as well.   To learn more about what you can do with MyChart, go to [email or web address].

## 2023-12-09 LAB — BASIC METABOLIC PANEL WITH GFR
BUN/Creatinine Ratio: 14 (ref 9–23)
BUN: 11 mg/dL (ref 6–24)
CO2: 23 mmol/L (ref 20–29)
Calcium: 9.4 mg/dL (ref 8.7–10.2)
Chloride: 98 mmol/L (ref 96–106)
Creatinine, Ser: 0.81 mg/dL (ref 0.57–1.00)
Glucose: 92 mg/dL (ref 70–99)
Potassium: 4.5 mmol/L (ref 3.5–5.2)
Sodium: 135 mmol/L (ref 134–144)
eGFR: 92 mL/min/1.73 (ref >59)

## 2023-12-11 ENCOUNTER — Telehealth

## 2023-12-22 ENCOUNTER — Ambulatory Visit: Admitting: Family Medicine

## 2023-12-30 ENCOUNTER — Other Ambulatory Visit: Payer: Self-pay

## 2023-12-30 NOTE — Patient Outreach (Signed)
 LCSW called patient for visit and was unable to reach her. LCSW left a voice mail stating that LCSW will attempted another visit on 01/06/2024. This was the 2nd unsuccessful attempt for patient.  Olam Ally, MSW, LCSW [place]  [facility], [facility] Licensed Clinical Social Worker Direct Dial: [phone]

## 2023-12-31 ENCOUNTER — Ambulatory Visit: Attending: Cardiology

## 2023-12-31 ENCOUNTER — Ambulatory Visit: Payer: Self-pay | Admitting: Cardiology

## 2023-12-31 DIAGNOSIS — R072 Precordial pain: Secondary | ICD-10-CM

## 2023-12-31 LAB — ECHOCARDIOGRAM COMPLETE
AR max vel: 3.5 cm2
AV Area VTI: 3.41 cm2
AV Area mean vel: 3.41 cm2
AV Mean grad: 3 mmHg
AV Peak grad: 5.4 mmHg
Ao pk vel: 1.16 m/s
Area-P 1/2: 4.6 cm2
S' Lateral: 2.86 cm

## 2024-01-06 ENCOUNTER — Other Ambulatory Visit: Payer: Self-pay
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
## 2024-01-06 NOTE — Patient Instructions (Signed)
 Visit Information  Thank you for taking time to visit with me today. Please don't hesitate to contact me if I can be of assistance to you before our next scheduled appointment.  Our next appointment is by telephone on 01/20/2024 at 10:00 am Please call the care guide team at [phone] if you need to cancel or reschedule your appointment.   Following is a copy of your care plan:   Goals Addressed             This Visit's Progress    VBCI Social Work Care Plan;LCSW       Problems:   Disease Management support and education needs related to Anxiety with Excessive Worry,, Depression: depressed mood, and Grief  CSW Clinical Goal(s):   Over the next 90 days the Patient will demonstrate a reduction in symptoms related to Anxiety with Excessive Worry, Depression: depressed mood Grief .  Interventions:  Mental Health:  Evaluation of current treatment plan related to Anxiety with Excessive Worry,, Depression: depressed mood, and Grief Active listening / Reflection utilized Mining engineer reviewed Financial risk analyst / information provided Emotional Support Provided PHQ2/PHQ9 completed LCSW discussed results of screening GAD 7 completed and results discussed Problem Solving /Task Center strategies reviewed Solution-Focued Strategies employed: Suicidal Ideation/Homicidal Ideation assessed: Patient reports no plan or intent Patient provided with the 988 crisis line LCSW discussed current coping skills with patient LCSW discussed medication compliance Patient is requesting assistance with accountability for therapy and support group Patient states that she will start attending a support group for grief that meets once a month. LCSW discussed a support group for parents through [facility]. Patient has identified a mental health provider of [facility]. LCSW in encouraging patient to follow up with them and make an appointment by next visit. Patient is requesting  Advanced Directive Paperwork  Patient Goals/Self-Care Activities:  Connect with provider for ongoing mental health treatment.    Plan:   Telephone follow up appointment with care management team member scheduled for:  01/20/2024 at 10:00 am        Please call the Suicide and Crisis Lifeline: 988 if you are experiencing a Mental Health or Behavioral Health Crisis or need someone to talk to.  Patient verbalizes understanding of instructions and care plan provided today and agrees to view in MyChart. Active MyChart status and patient understanding of how to access instructions and care plan via MyChart confirmed with patient.     Olam Ally, MSW, LCSW [place]  [facility], [facility] Health Licensed Clinical Social Worker Direct Dial: [phone]

## 2024-01-11 ENCOUNTER — Other Ambulatory Visit: Payer: Self-pay | Admitting: Family Medicine

## 2024-01-13 ENCOUNTER — Telehealth: Payer: Self-pay

## 2024-01-13 NOTE — Telephone Encounter (Signed)
 Requested medications are due for refill today.  no  Requested medications are on the active medications list.  yes  Last refill. 08/10/2023 #360 1 rf  Future visit scheduled.   11/2024  Notes to clinic.  Refusal not delegated.    Requested Prescriptions  Pending Prescriptions Disp Refills   pregabalin  (LYRICA ) 100 MG capsule [Pharmacy Med Name: PREGABALIN  100 MG CAPSULE] 120 capsule     Sig: TAKE TAKE 1 CAPSULE BY MOUTH 2 TIMES A DAY IN THE MORNING AND IN THE EVENING AND TAKE 2 CAPSULES BY MOUTH AT BEDTIME .TAKE MORNING AND EVENING WITH 50MG  CAPSULE     Not Delegated - Neurology:  Anticonvulsants - Controlled - pregabalin  Failed - 01/13/2024  3:26 PM      Failed - This refill cannot be delegated      Passed - Cr in normal range and within 360 days    Creatinine, Ser  Date Value Ref Range Status  12/08/2023 0.81 0.57 - 1.00 mg/dL Final         Passed - Completed PHQ-2 or PHQ-9 in the last 360 days      Passed - Valid encounter within last 12 months    Recent Outpatient Visits           1 month ago Severe episode of recurrent major depressive disorder, without psychotic features (HCC)   [place] [facility] [place], Megan P, DO   2 months ago Migraine without status migrainosus, not intractable, unspecified migraine type   [place] [facility] [place], Megan P, DO   3 months ago Chest pain, unspecified type   [place] [facility] [place], Megan P, DO   4 months ago Chest pain, unspecified type   [place] [facility] [place], Megan P, DO   5 months ago Acute bacterial sinusitis   [place] [facility] Vicci Duwaine SQUIBB, DO       Future Appointments             In 1 month Agbor-Etang, Redell, MD [facility] at [facility]

## 2024-01-13 NOTE — Progress Notes (Signed)
 Complex Care Management Care Guide Note  01/13/2024 Name: Sydney Valenzuela MRN: [id number] DOB: Nov 23, 1980  [name] is a 43 y.o. year old female who is a primary care patient of Vicci Duwaine SQUIBB, DO and is actively engaged with the care management team. I reached out to [name] by phone today to assist with re-scheduling  with the Licensed Clinical Child psychotherapist.  Follow up plan: Unsuccessful telephone outreach attempt made. A HIPAA compliant phone message was left for the patient providing contact information and requesting a return call.  Jeoffrey Buffalo , RMA     [facility]  [facility], [facility] Guide  Direct Dial: [phone]  Website: [email or web address]

## 2024-01-15 ENCOUNTER — Encounter (HOSPITAL_COMMUNITY): Payer: Self-pay

## 2024-01-20 ENCOUNTER — Telehealth

## 2024-01-21 ENCOUNTER — Other Ambulatory Visit: Payer: Self-pay | Admitting: Medical Genetics

## 2024-01-21 DIAGNOSIS — Z006 Encounter for examination for normal comparison and control in clinical research program: Secondary | ICD-10-CM

## 2024-01-27 ENCOUNTER — Other Ambulatory Visit: Payer: Self-pay | Admitting: Family Medicine

## 2024-01-29 NOTE — Telephone Encounter (Signed)
 Requested medications are due for refill today.  unsure  Requested medications are on the active medications list.  yes  Last refill. 10/10/2023 #30 1 rf  Future visit scheduled.   Yes - next year  Notes to clinic.  Pt has both baclofen  and tizanidine  on med list.    Requested Prescriptions  Pending Prescriptions Disp Refills   baclofen  (LIORESAL ) 10 MG tablet [Pharmacy Med Name: BACLOFEN  10 MG TABLET] 30 tablet 1    Sig: TAKE 1 TABLET BY MOUTH EVERY NIGHT AT BEDTIME AS NEEDED FOR MUSCLE SPASMS     Analgesics:  Muscle Relaxants - baclofen  Passed - 01/29/2024 12:01 PM      Passed - Cr in normal range and within 180 days    Creatinine, Ser  Date Value Ref Range Status  12/08/2023 0.81 0.57 - 1.00 mg/dL Final         Passed - eGFR is 30 or above and within 180 days    GFR calc Af Amer  Date Value Ref Range Status  02/26/2017 133 >59 mL/min/1.73 Final   GFR, Estimated  Date Value Ref Range Status  08/29/2023 >60 >60 mL/min Final    Comment:    (NOTE) Calculated using the CKD-EPI Creatinine Equation (2021)    eGFR  Date Value Ref Range Status  12/08/2023 92 >59 mL/min/1.73 Final         Passed - Valid encounter within last 6 months    Recent Outpatient Visits           1 month ago Severe episode of recurrent major depressive disorder, without psychotic features (HCC)   [place] [facility] [place], Megan P, DO   3 months ago Migraine without status migrainosus, not intractable, unspecified migraine type   [place] [facility] [place], Megan P, DO   3 months ago Chest pain, unspecified type   [place] [facility] [place], Megan P, DO   4 months ago Chest pain, unspecified type   [place] [facility] [place], Megan P, DO   6 months ago Acute bacterial sinusitis   [place] [facility] [place], Duwaine SQUIBB, DO       Future Appointments             In 1 month Agbor-Etang, Redell, MD [facility]  Health HeartCare at [facility]

## 2024-01-30 ENCOUNTER — Other Ambulatory Visit: Payer: Self-pay

## 2024-01-31 ENCOUNTER — Other Ambulatory Visit: Payer: Self-pay

## 2024-02-02 ENCOUNTER — Encounter: Payer: Self-pay | Admitting: Radiology

## 2024-02-02 ENCOUNTER — Other Ambulatory Visit (HOSPITAL_COMMUNITY): Payer: Self-pay

## 2024-02-05 ENCOUNTER — Encounter: Payer: Self-pay | Admitting: Pharmacy Technician

## 2024-02-05 ENCOUNTER — Other Ambulatory Visit (HOSPITAL_COMMUNITY): Payer: Self-pay

## 2024-02-05 NOTE — Telephone Encounter (Signed)
 ERROR

## 2024-02-06 ENCOUNTER — Other Ambulatory Visit (HOSPITAL_COMMUNITY): Payer: Self-pay

## 2024-02-07 ENCOUNTER — Encounter (HOSPITAL_COMMUNITY): Payer: Self-pay

## 2024-02-07 ENCOUNTER — Other Ambulatory Visit (HOSPITAL_COMMUNITY): Payer: Self-pay

## 2024-02-09 ENCOUNTER — Other Ambulatory Visit: Payer: Self-pay

## 2024-02-09 ENCOUNTER — Other Ambulatory Visit (HOSPITAL_COMMUNITY): Payer: Self-pay

## 2024-02-10 ENCOUNTER — Other Ambulatory Visit: Payer: Self-pay | Admitting: Family Medicine

## 2024-02-10 NOTE — Progress Notes (Signed)
 Complex Care Management Care Guide Note  02/10/2024 Name: [name] MRN: [id number] DOB: 1981-01-24  Sydney Valenzuela is a 43 y.o. year old female who is a primary care patient of Vicci Duwaine SQUIBB, DO and is actively engaged with the care management team. I reached out to Sydney Valenzuela by phone today to assist with re-scheduling  with the Licensed Clinical Child Psychotherapist.  Follow up plan: Unsuccessful telephone outreach attempt made. A HIPAA compliant phone message was left for the patient providing contact information and requesting a return call.  Jeoffrey Buffalo , RMA     [facility]  [facility], [facility] Guide  Direct Dial: [phone]  Website: [email or web address]

## 2024-02-12 NOTE — Telephone Encounter (Signed)
 Requested Prescriptions  Pending Prescriptions Disp Refills   pantoprazole  (PROTONIX ) 40 MG tablet [Pharmacy Med Name: PANTOPRAZOLE  TAB 40MG ] 180 tablet 0    Sig: TAKE 1 TABLET TWICE A DAY     Gastroenterology: Proton Pump Inhibitors Passed - 02/12/2024 11:00 AM      Passed - Valid encounter within last 12 months    Recent Outpatient Visits           2 months ago Severe episode of recurrent major depressive disorder, without psychotic features (HCC)   [place] [facility] [place], Megan P, DO   3 months ago Migraine without status migrainosus, not intractable, unspecified migraine type   [place] [facility] [place], Megan P, DO   4 months ago Chest pain, unspecified type   [place] [facility] [place], Megan P, DO   5 months ago Chest pain, unspecified type   [place] [facility] [place], Megan P, DO   6 months ago Acute bacterial sinusitis   [place] [facility] Vicci Duwaine SQUIBB, DO       Future Appointments             In 3 weeks Agbor-Etang, Redell, MD [facility] at [facility]

## 2024-02-17 ENCOUNTER — Other Ambulatory Visit (HOSPITAL_COMMUNITY): Payer: Self-pay

## 2024-02-19 ENCOUNTER — Encounter: Payer: Self-pay | Admitting: Family Medicine

## 2024-02-19 ENCOUNTER — Ambulatory Visit (INDEPENDENT_AMBULATORY_CARE_PROVIDER_SITE_OTHER): Admitting: Family Medicine

## 2024-02-19 ENCOUNTER — Other Ambulatory Visit: Payer: Self-pay | Admitting: Family Medicine

## 2024-02-19 VITALS — BP 128/89 | HR 85 | Temp 97.8°F | Ht 61.0 in | Wt 238.2 lb

## 2024-02-19 DIAGNOSIS — R7301 Impaired fasting glucose: Secondary | ICD-10-CM | POA: Diagnosis not present

## 2024-02-19 DIAGNOSIS — M797 Fibromyalgia: Secondary | ICD-10-CM

## 2024-02-19 DIAGNOSIS — Z789 Other specified health status: Secondary | ICD-10-CM

## 2024-02-19 DIAGNOSIS — F332 Major depressive disorder, recurrent severe without psychotic features: Secondary | ICD-10-CM | POA: Diagnosis not present

## 2024-02-19 DIAGNOSIS — J029 Acute pharyngitis, unspecified: Secondary | ICD-10-CM

## 2024-02-19 DIAGNOSIS — R0683 Snoring: Secondary | ICD-10-CM | POA: Diagnosis not present

## 2024-02-19 DIAGNOSIS — E782 Mixed hyperlipidemia: Secondary | ICD-10-CM | POA: Diagnosis not present

## 2024-02-19 LAB — BAYER DCA HB A1C WAIVED: HB A1C (BAYER DCA - WAIVED): 5.4 % (ref 4.8–5.6)

## 2024-02-19 MED ORDER — ALBUTEROL SULFATE HFA 108 (90 BASE) MCG/ACT IN AERS: 2.0000 | INHALATION_SPRAY | Freq: Four times a day (QID) | RESPIRATORY_TRACT | 6 refills | Status: AC | PRN

## 2024-02-19 MED ORDER — PREGABALIN 50 MG PO CAPS: 50.0000 mg | ORAL_CAPSULE | Freq: Two times a day (BID) | ORAL | 1 refills | Status: AC

## 2024-02-19 MED ORDER — PANTOPRAZOLE SODIUM 40 MG PO TBEC: 40.0000 mg | DELAYED_RELEASE_TABLET | Freq: Two times a day (BID) | ORAL | 0 refills | Status: AC

## 2024-02-19 MED ORDER — PREGABALIN 100 MG PO CAPS: ORAL_CAPSULE | ORAL | 1 refills | Status: AC

## 2024-02-19 MED ORDER — TIZANIDINE HCL 4 MG PO TABS: 4.0000 mg | ORAL_TABLET | Freq: Four times a day (QID) | ORAL | 0 refills | Status: AC | PRN

## 2024-02-19 NOTE — Progress Notes (Signed)
 BP 128/89   Pulse 85   Temp 97.8 F (36.6 C) (Oral)   Ht 5' 1 (1.549 m)   Wt 238 lb 3.2 oz (108 kg)   SpO2 98%   BMI 45.01 kg/m    Subjective:    Patient ID: Sydney Valenzuela, female    DOB: 21-Jun-1980, 43 y.o.   MRN: [id number]  HPI: Sydney Valenzuela is a 43 y.o. female  Chief Complaint  Patient presents with   Sore Throat    2 months. Concerns of possible tonsil stones. White stones on back of throat. Bad taste    Depression   Anxiety   HAs had a sore throat under her tongue and on the roof of her mouth for about 2 months. She has been having blisters at the back of her throat that will pop and she will have a bad taste in her mouth. She notes that when she coughs it will pop.   HYPERLIPIDEMIA Hyperlipidemia status: stable Satisfied with current treatment?  yes Side effects:  N/A Past cholesterol meds: none Supplements: none Aspirin:  no The 10-year ASCVD risk score (Arnett DK, et al., 2019) is: 0.7%   Values used to calculate the score:     Age: 80 years     Clincally relevant sex: Female     Is Non-Hispanic African American: No     Diabetic: No     Tobacco smoker: No     Systolic Blood Pressure: 128 mmHg     Is BP treated: No     HDL Cholesterol: 74 mg/dL     Total Cholesterol: 258 mg/dL Chest pain:  no Coronary artery disease:  no Family history CAD:  yes  DEPRESSION Mood status: uncontrolled Satisfied with current treatment?: no Symptom severity: severe  Duration of current treatment : chronic Side effects: no Medication compliance: excellent compliance Psychotherapy/counseling: yes current Depressed mood: yes Anxious mood: yes Anhedonia: yes Significant weight loss or gain: no Insomnia: yes hard to fall asleep Fatigue: yes Feelings of worthlessness or guilt: yes Impaired concentration/indecisiveness: yes Suicidal ideations: yes Hopelessness: yes Crying spells: yes    02/19/2024   11:52 AM 01/06/2024   10:03 AM 12/03/2023   11:49 AM 11/27/2023    10:29 AM 10/29/2023    8:46 AM  Depression screen PHQ 2/9  Decreased Interest 3 3 3 3 3   Down, Depressed, Hopeless 3 3 3 3 3   PHQ - 2 Score 6 6 6 6 6   Altered sleeping 3 3 3 3 3   Tired, decreased energy 3 3 3 3 3   Change in appetite 3 2 3 2 3   Feeling bad or failure about yourself  3 3 3 3 3   Trouble concentrating 3 3 3 3 3   Moving slowly or fidgety/restless 2 0 3 0 3  Suicidal thoughts 1 2 3 1 3   PHQ-9 Score 24 22  27  21  27    Difficult doing work/chores Extremely dIfficult Extremely dIfficult Extremely dIfficult Extremely dIfficult Extremely dIfficult     Data saved with a previous flowsheet row definition    FIBROMYALGIA Pain status: stable Satisfied with current treatment?: yes Medication side effects: no Medication compliance: excellent compliance Duration: chronic Location: widespread Quality: aching and sore Current pain level: moderate Previous pain level: moderate Aggravating factors: movement, stress Alleviating factors: lyrica  Previous pain specialty evaluation: yes Non-narcotic analgesic meds: yes Narcotic contract:no Treatments attempted: rest, ice, heat, APAP, ibuprofen , aleve, and physical therapy   ???SLEEP APNEA Sleep apnea status: unsure  Duration: chronic Satisfied with current treatment?:  no CPAP use:  no Last sleep study: never Wakes feeling refreshed:  no Daytime hypersomnolence:  yes Fatigue:  yes Insomnia:  yes Good sleep hygiene:  no Difficulty falling asleep:  yes Difficulty staying asleep:  yes Snoring bothers bed partner:  yes Observed apnea by bed partner: yes Obesity:  yes Hypertension: no  Pulmonary hypertension:  no Coronary artery disease:  no    Relevant past medical, surgical, family and social history reviewed and updated as indicated. Interim medical history since our last visit reviewed. Allergies and medications reviewed and updated.  Review of Systems  Constitutional: Negative.   Respiratory: Negative.     Cardiovascular: Negative.   Musculoskeletal: Negative.   Neurological: Negative.   Psychiatric/Behavioral:  Positive for dysphoric mood and suicidal ideas. Negative for agitation, behavioral problems, confusion, decreased concentration, hallucinations, self-injury and sleep disturbance. The patient is nervous/anxious. The patient is not hyperactive.     Per HPI unless specifically indicated above     Objective:    BP 128/89   Pulse 85   Temp 97.8 F (36.6 C) (Oral)   Ht 5' 1 (1.549 m)   Wt 238 lb 3.2 oz (108 kg)   SpO2 98%   BMI 45.01 kg/m   Wt Readings from Last 3 Encounters:  02/19/24 238 lb 3.2 oz (108 kg)  12/08/23 241 lb 6.4 oz (109.5 kg)  12/03/23 243 lb 9.6 oz (110.5 kg)    Physical Exam Vitals and nursing note reviewed.  Constitutional:      General: She is not in acute distress.    Appearance: Normal appearance. She is well-developed. She is obese. She is not ill-appearing, toxic-appearing or diaphoretic.  HENT:     Head: Normocephalic and atraumatic.     Right Ear: External ear normal.     Left Ear: External ear normal.     Nose: Nose normal.     Mouth/Throat:     Mouth: Mucous membranes are moist.     Pharynx: Oropharynx is clear.  Eyes:     General: No scleral icterus.       Right eye: No discharge.        Left eye: No discharge.     Extraocular Movements: Extraocular movements intact.     Conjunctiva/sclera: Conjunctivae normal.     Pupils: Pupils are equal, round, and reactive to light.  Cardiovascular:     Rate and Rhythm: Normal rate and regular rhythm.     Pulses: Normal pulses.     Heart sounds: Normal heart sounds. No murmur heard.    No friction rub. No gallop.  Pulmonary:     Effort: Pulmonary effort is normal. No respiratory distress.     Breath sounds: Normal breath sounds. No stridor. No wheezing, rhonchi or rales.  Chest:     Chest wall: No tenderness.  Musculoskeletal:        General: Normal range of motion.     Cervical back:  Normal range of motion and neck supple.  Skin:    General: Skin is warm and dry.     Capillary Refill: Capillary refill takes less than 2 seconds.     Coloration: Skin is not jaundiced or pale.     Findings: No bruising, erythema, lesion or rash.  Neurological:     General: No focal deficit present.     Mental Status: She is alert and oriented to person, place, and time. Mental status is at baseline.  Psychiatric:  Mood and Affect: Mood normal.        Behavior: Behavior normal.        Thought Content: Thought content normal.        Judgment: Judgment normal.     Results for orders placed or performed in visit on 02/19/24  Bayer DCA Hb A1c Waived   Collection Time: 02/19/24 11:48 AM  Result Value Ref Range   HB A1C (BAYER DCA - WAIVED) 5.4 4.8 - 5.6 %  CBC with Differential/Platelet   Collection Time: 02/19/24 11:49 AM  Result Value Ref Range   WBC 8.7 3.4 - 10.8 x10E3/uL   RBC 4.30 3.77 - 5.28 x10E6/uL   Hemoglobin 13.0 11.1 - 15.9 g/dL   Hematocrit 60.5 65.9 - 46.6 %   MCV 92 79 - 97 fL   MCH 30.2 26.6 - 33.0 pg   MCHC 33.0 31.5 - 35.7 g/dL   RDW 86.7 88.2 - 84.5 %   Platelets 295 150 - 450 x10E3/uL   Neutrophils 79 Not Estab. %   Lymphs 17 Not Estab. %   Monocytes 3 Not Estab. %   Eos 0 Not Estab. %   Basos 0 Not Estab. %   Neutrophils Absolute 6.8 1.4 - 7.0 x10E3/uL   Lymphocytes Absolute 1.5 0.7 - 3.1 x10E3/uL   Monocytes Absolute 0.3 0.1 - 0.9 x10E3/uL   EOS (ABSOLUTE) 0.0 0.0 - 0.4 x10E3/uL   Basophils Absolute 0.0 0.0 - 0.2 x10E3/uL   Immature Granulocytes 1 Not Estab. %   Immature Grans (Abs) 0.1 0.0 - 0.1 x10E3/uL  Comprehensive metabolic panel with GFR   Collection Time: 02/19/24 11:49 AM  Result Value Ref Range   Glucose 87 70 - 99 mg/dL   BUN 12 6 - 24 mg/dL   Creatinine, Ser 9.09 0.57 - 1.00 mg/dL   eGFR 81 >40 fO/fpw/8.26   BUN/Creatinine Ratio 13 9 - 23   Sodium 136 134 - 144 mmol/L   Potassium 4.6 3.5 - 5.2 mmol/L   Chloride 100 96 - 106  mmol/L   CO2 19 (L) 20 - 29 mmol/L   Calcium 9.0 8.7 - 10.2 mg/dL   Total Protein 6.2 6.0 - 8.5 g/dL   Albumin 4.3 3.9 - 4.9 g/dL   Globulin, Total 1.9 1.5 - 4.5 g/dL   Bilirubin Total 0.4 0.0 - 1.2 mg/dL   Alkaline Phosphatase 125 (H) 41 - 116 IU/L   AST 17 0 - 40 IU/L   ALT 18 0 - 32 IU/L  Lipid Panel w/o Chol/HDL Ratio   Collection Time: 02/19/24 11:49 AM  Result Value Ref Range   Cholesterol, Total 258 (H) 100 - 199 mg/dL   Triglycerides 714 (H) 0 - 149 mg/dL   HDL 74 >60 mg/dL   VLDL Cholesterol Cal 50 (H) 5 - 40 mg/dL   LDL Chol Calc (NIH) 865 (H) 0 - 99 mg/dL  TSH   Collection Time: 02/19/24 11:49 AM  Result Value Ref Range   TSH 2.120 0.450 - 4.500 uIU/mL  Hepatitis B surface antibody,quantitative   Collection Time: 02/19/24 11:49 AM  Result Value Ref Range   Hepatitis B Surf Ab Quant 65.5 Immunity>10 mIU/mL      Assessment & Plan:   Problem List Items Addressed This Visit       Endocrine   IFG (impaired fasting glucose)   Rechecking labs today. Await results. Treat as needed.       Relevant Orders   CBC with Differential/Platelet (Completed)   Comprehensive metabolic  panel with GFR (Completed)   TSH (Completed)   Bayer DCA Hb A1c Waived (Completed)     Other   Depression, major, recurrent   Continue to follow with psychiatry. Call with any concerns. Continue to monitor. Call with any concerns.       Relevant Orders   CBC with Differential/Platelet (Completed)   Comprehensive metabolic panel with GFR (Completed)   TSH (Completed)   Fibromyalgia   Under good control on current regimen. Continue current regimen. Continue to monitor. Call with any concerns. Refills given. Labs drawn today.        Relevant Medications   pregabalin  (LYRICA ) 100 MG capsule   pregabalin  (LYRICA ) 50 MG capsule   tiZANidine  (ZANAFLEX ) 4 MG tablet   Mixed hyperlipidemia   Rechecking labs today. Await results. Treat as needed.       Relevant Orders   CBC with  Differential/Platelet (Completed)   Comprehensive metabolic panel with GFR (Completed)   Lipid Panel w/o Chol/HDL Ratio (Completed)   TSH (Completed)   Other Visit Diagnoses       Snoring    -  Primary   [place] for OSA- will check sleep study. Await results. Treat as needed.   Relevant Orders   Ambulatory referral to Sleep Studies     Hepatitis B vaccination status unknown       Labs drawn today. Await results. Treat as needed.   Relevant Orders   Hepatitis B surface antibody,quantitative (Completed)     Sore throat       Unable to see anything in anterior pharynx. Referral to ENT placed today.   Relevant Orders   Ambulatory referral to ENT        Follow up plan: Return in about 6 weeks (around 04/01/2024).

## 2024-02-20 LAB — COMPREHENSIVE METABOLIC PANEL WITH GFR
ALT: 18 IU/L (ref 0–32)
AST: 17 IU/L (ref 0–40)
Albumin: 4.3 g/dL (ref 3.9–4.9)
Alkaline Phosphatase: 125 IU/L — ABNORMAL HIGH (ref 41–116)
BUN/Creatinine Ratio: 13 (ref 9–23)
BUN: 12 mg/dL (ref 6–24)
Bilirubin Total: 0.4 mg/dL (ref 0.0–1.2)
CO2: 19 mmol/L — ABNORMAL LOW (ref 20–29)
Calcium: 9 mg/dL (ref 8.7–10.2)
Chloride: 100 mmol/L (ref 96–106)
Creatinine, Ser: 0.9 mg/dL (ref 0.57–1.00)
Globulin, Total: 1.9 g/dL (ref 1.5–4.5)
Glucose: 87 mg/dL (ref 70–99)
Potassium: 4.6 mmol/L (ref 3.5–5.2)
Sodium: 136 mmol/L (ref 134–144)
Total Protein: 6.2 g/dL (ref 6.0–8.5)
eGFR: 81 mL/min/1.73 (ref >59)

## 2024-02-20 LAB — CBC WITH DIFFERENTIAL/PLATELET
Basophils Absolute: 0 x10E3/uL (ref 0.0–0.2)
Basos: 0 %
EOS (ABSOLUTE): 0 x10E3/uL (ref 0.0–0.4)
Eos: 0 %
Hematocrit: 39.4 % (ref 34.0–46.6)
Hemoglobin: 13 g/dL (ref 11.1–15.9)
Immature Grans (Abs): 0.1 x10E3/uL (ref 0.0–0.1)
Immature Granulocytes: 1 %
Lymphocytes Absolute: 1.5 x10E3/uL (ref 0.7–3.1)
Lymphs: 17 %
MCH: 30.2 pg (ref 26.6–33.0)
MCHC: 33 g/dL (ref 31.5–35.7)
MCV: 92 fL (ref 79–97)
Monocytes Absolute: 0.3 x10E3/uL (ref 0.1–0.9)
Monocytes: 3 %
Neutrophils Absolute: 6.8 x10E3/uL (ref 1.4–7.0)
Neutrophils: 79 %
Platelets: 295 x10E3/uL (ref 150–450)
RBC: 4.3 x10E6/uL (ref 3.77–5.28)
RDW: 13.2 % (ref 11.7–15.4)
WBC: 8.7 x10E3/uL (ref 3.4–10.8)

## 2024-02-20 LAB — LIPID PANEL W/O CHOL/HDL RATIO
Cholesterol, Total: 258 mg/dL — ABNORMAL HIGH (ref 100–199)
HDL: 74 mg/dL (ref >39)
LDL Chol Calc (NIH): 134 mg/dL — ABNORMAL HIGH (ref 0–99)
Triglycerides: 285 mg/dL — ABNORMAL HIGH (ref 0–149)
VLDL Cholesterol Cal: 50 mg/dL — ABNORMAL HIGH (ref 5–40)

## 2024-02-20 LAB — HEPATITIS B SURFACE ANTIBODY, QUANTITATIVE: Hepatitis B Surf Ab Quant: 65.5 m[IU]/mL

## 2024-02-20 LAB — TSH: TSH: 2.12 u[IU]/mL (ref 0.450–4.500)

## 2024-02-21 NOTE — Telephone Encounter (Signed)
 Duplicated request, refilled 02/19/24.  Requested Prescriptions  Pending Prescriptions Disp Refills   pregabalin  (LYRICA ) 100 MG capsule [Pharmacy Med Name: PREGABALIN  100 MG CAPSULE] 120 capsule     Sig: TAKE 1 CAPSULE BY MOUTH 2 TIMES A DAY IN THE MORNING AND EVENING AND TAKE 2 CAPSULES BY MOUTH EVERY NIGHT AT BEDTIME *TAKE MORNING AND EVENING WITH 50 MG CAPSULE*     Not Delegated - Neurology:  Anticonvulsants - Controlled - pregabalin  Failed - 02/21/2024  8:54 AM      Failed - This refill cannot be delegated      Passed - Cr in normal range and within 360 days    Creatinine, Ser  Date Value Ref Range Status  02/19/2024 0.90 0.57 - 1.00 mg/dL Final         Passed - Completed PHQ-2 or PHQ-9 in the last 360 days      Passed - Valid encounter within last 12 months    Recent Outpatient Visits           2 days ago IFG (impaired fasting glucose)   [place] [facility] [place], Megan P, DO   2 months ago Severe episode of recurrent major depressive disorder, without psychotic features (HCC)   [place] [facility] [place], Megan P, DO   3 months ago Migraine without status migrainosus, not intractable, unspecified migraine type   [place] [facility] [place], Megan P, DO   4 months ago Chest pain, unspecified type   [place] [facility] [place], Megan P, DO   5 months ago Chest pain, unspecified type    [facility] Vicci Duwaine SQUIBB, DO       Future Appointments             In 2 weeks Agbor-Etang, Redell, MD [facility]

## 2024-02-23 ENCOUNTER — Ambulatory Visit: Payer: Self-pay | Admitting: Family Medicine

## 2024-02-23 NOTE — Assessment & Plan Note (Signed)
Continue to follow with psychiatry. Call with any concerns. Continue to monitor. Call with any concerns.

## 2024-02-23 NOTE — Assessment & Plan Note (Signed)
 Rechecking labs today. Await results. Treat as needed.

## 2024-02-23 NOTE — Assessment & Plan Note (Signed)
 Under good control on current regimen. Continue current regimen. Continue to monitor. Call with any concerns. Refills given. Labs drawn today.

## 2024-02-25 NOTE — Addendum Note (Signed)
 Addended by: Zamani Crocker M on: 02/25/2024 08:59 AM   Modules accepted: Orders

## 2024-03-05 ENCOUNTER — Other Ambulatory Visit: Payer: Self-pay

## 2024-03-05 NOTE — Patient Outreach (Signed)
 LCSW called patient and was unable to reach patient. Per chart review this was the 3rd attempted to reach patient. LCSW left contact information on voicemail that if patient would like to schedule to give LCSW a call. If LCSW does not hear back from patient, patient will be discharged from services.  Olam Ally, MSW, LCSW [place]  [facility], [facility] Licensed Clinical Social Worker Direct Dial: [phone]

## 2024-03-05 NOTE — Patient Instructions (Signed)
 Visit Information  ODEAL WELDEN - I am sorry I was unable to reach you today for our scheduled appointment. I work with Vicci Duwaine SQUIBB, DO and am calling to support your healthcare needs. Please contact me at [phone] at your earliest convenience. I look forward to speaking with you soon.   Thank you,  Olam Ally, MSW, LCSW [place]  [facility], [facility] Licensed Clinical Social Worker Direct Dial: [phone]

## 2024-03-08 ENCOUNTER — Ambulatory Visit: Attending: Cardiology | Admitting: Cardiology

## 2024-03-30 ENCOUNTER — Other Ambulatory Visit: Payer: Self-pay

## 2024-04-05 ENCOUNTER — Ambulatory Visit: Admitting: Family Medicine

## 2024-04-05 ENCOUNTER — Encounter: Payer: Self-pay | Admitting: Family Medicine

## 2024-04-05 ENCOUNTER — Encounter: Payer: Self-pay | Admitting: Sleep Medicine

## 2024-04-05 VITALS — BP 131/87 | HR 74 | Ht 61.0 in | Wt 243.8 lb

## 2024-04-05 DIAGNOSIS — E66813 Obesity, class 3: Secondary | ICD-10-CM

## 2024-04-05 DIAGNOSIS — J441 Chronic obstructive pulmonary disease with (acute) exacerbation: Secondary | ICD-10-CM

## 2024-04-05 DIAGNOSIS — F332 Major depressive disorder, recurrent severe without psychotic features: Secondary | ICD-10-CM | POA: Diagnosis not present

## 2024-04-05 DIAGNOSIS — H66001 Acute suppurative otitis media without spontaneous rupture of ear drum, right ear: Secondary | ICD-10-CM

## 2024-04-05 MED ORDER — METHYLPREDNISOLONE SODIUM SUCC 40 MG IJ SOLR
40.0000 mg | Freq: Once | INTRAMUSCULAR | Status: AC
  Administered 2024-04-05: 40 mg via INTRAMUSCULAR

## 2024-04-05 MED ORDER — AMOXICILLIN-POT CLAVULANATE 875-125 MG PO TABS: 1.0000 | ORAL_TABLET | Freq: Two times a day (BID) | ORAL | 0 refills | Status: DC

## 2024-04-05 MED ORDER — PREDNISONE 10 MG PO TABS: ORAL_TABLET | ORAL | 0 refills | Status: DC

## 2024-04-05 NOTE — Progress Notes (Signed)
 "  BP 131/87   Pulse 74   Ht 5' 1 (1.549 m)   Wt 243 lb 12.8 oz (110.6 kg)   BMI 46.07 kg/m    Subjective:    Patient ID: Sydney Valenzuela, female    DOB: 1981/02/24, 44 y.o.   MRN: [id number]  HPI: Sydney Valenzuela is a 43 y.o. female  Chief Complaint  Patient presents with   Cough   Nasal Congestion   Shortness of Breath   UPPER RESPIRATORY TRACT INFECTION Duration: about a week Worst symptom: congestion, cough, SOB Fever: no Cough: yes Shortness of breath: yes Wheezing: yes Chest pain: yes Chest tightness: yes Chest congestion: yes Nasal congestion: yes Runny nose: no Post nasal drip: yes Sneezing: no Sore throat: yes Swollen glands: no Sinus pressure: yes Headache: yes Face pain: yes Toothache: no Ear pain: yes right Ear pressure: no  Eyes red/itching:no Eye drainage/crusting: no  Vomiting: no Rash: no Fatigue: yes Sick contacts: no Strep contacts: no  Context: worse Recurrent sinusitis: no Relief with OTC cold/cough medications: no  Treatments attempted: prednisone , inhalers   Relevant past medical, surgical, family and social history reviewed and updated as indicated. Interim medical history since our last visit reviewed. Allergies and medications reviewed and updated.  Review of Systems  Constitutional: Negative.   HENT:  Positive for congestion, ear pain, postnasal drip, rhinorrhea, sinus pressure and sinus pain. Negative for dental problem, drooling, ear discharge, facial swelling, hearing loss, mouth sores, nosebleeds, sneezing, sore throat, tinnitus, trouble swallowing and voice change.   Eyes: Negative.   Respiratory:  Positive for cough and shortness of breath. Negative for apnea, choking, chest tightness and stridor.   Cardiovascular: Negative.   Musculoskeletal: Negative.   Skin: Negative.   Psychiatric/Behavioral:  Positive for dysphoric mood. Negative for agitation, behavioral problems, confusion, decreased concentration, hallucinations,  self-injury, sleep disturbance and suicidal ideas. The patient is nervous/anxious. The patient is not hyperactive.     Per HPI unless specifically indicated above     Objective:    BP 131/87   Pulse 74   Ht 5' 1 (1.549 m)   Wt 243 lb 12.8 oz (110.6 kg)   BMI 46.07 kg/m   Wt Readings from Last 3 Encounters:  04/05/24 243 lb 12.8 oz (110.6 kg)  02/19/24 238 lb 3.2 oz (108 kg)  12/08/23 241 lb 6.4 oz (109.5 kg)    Physical Exam Vitals and nursing note reviewed.  Constitutional:      General: She is not in acute distress.    Appearance: Normal appearance. She is well-developed. She is obese. She is not ill-appearing, toxic-appearing or diaphoretic.  HENT:     Head: Normocephalic and atraumatic.     Right Ear: Hearing, ear canal and external ear normal. Tympanic membrane is erythematous and bulging.     Left Ear: Hearing, tympanic membrane, ear canal and external ear normal.     Nose: Nose normal.     Right Turbinates: Enlarged. Not swollen or pale.     Left Turbinates: Enlarged. Not swollen or pale.     Right Sinus: No maxillary sinus tenderness or frontal sinus tenderness.     Left Sinus: No maxillary sinus tenderness or frontal sinus tenderness.     Mouth/Throat:     Mouth: Mucous membranes are moist.     Pharynx: Oropharynx is clear.  Eyes:     General: No scleral icterus.       Right eye: No discharge.  Left eye: No discharge.     Extraocular Movements: Extraocular movements intact.     Conjunctiva/sclera: Conjunctivae normal.     Pupils: Pupils are equal, round, and reactive to light.  Cardiovascular:     Rate and Rhythm: Normal rate and regular rhythm.     Pulses: Normal pulses.     Heart sounds: Normal heart sounds. No murmur heard.    No friction rub. No gallop.  Pulmonary:     Effort: Pulmonary effort is normal. No respiratory distress.     Breath sounds: Normal breath sounds. No stridor. No wheezing, rhonchi or rales.  Chest:     Chest wall: No  tenderness.  Musculoskeletal:        General: Normal range of motion.     Cervical back: Normal range of motion and neck supple.  Skin:    General: Skin is warm and dry.     Capillary Refill: Capillary refill takes less than 2 seconds.     Coloration: Skin is not jaundiced or pale.     Findings: No bruising, erythema, lesion or rash.  Neurological:     General: No focal deficit present.     Mental Status: She is alert and oriented to person, place, and time. Mental status is at baseline.  Psychiatric:        Mood and Affect: Mood normal.        Behavior: Behavior normal.        Thought Content: Thought content normal.        Judgment: Judgment normal.     Results for orders placed or performed in visit on 02/19/24  Bayer DCA Hb A1c Waived   Collection Time: 02/19/24 11:48 AM  Result Value Ref Range   HB A1C (BAYER DCA - WAIVED) 5.4 4.8 - 5.6 %  CBC with Differential/Platelet   Collection Time: 02/19/24 11:49 AM  Result Value Ref Range   WBC 8.7 3.4 - 10.8 x10E3/uL   RBC 4.30 3.77 - 5.28 x10E6/uL   Hemoglobin 13.0 11.1 - 15.9 g/dL   Hematocrit 60.5 65.9 - 46.6 %   MCV 92 79 - 97 fL   MCH 30.2 26.6 - 33.0 pg   MCHC 33.0 31.5 - 35.7 g/dL   RDW 86.7 88.2 - 84.5 %   Platelets 295 150 - 450 x10E3/uL   Neutrophils 79 Not Estab. %   Lymphs 17 Not Estab. %   Monocytes 3 Not Estab. %   Eos 0 Not Estab. %   Basos 0 Not Estab. %   Neutrophils Absolute 6.8 1.4 - 7.0 x10E3/uL   Lymphocytes Absolute 1.5 0.7 - 3.1 x10E3/uL   Monocytes Absolute 0.3 0.1 - 0.9 x10E3/uL   EOS (ABSOLUTE) 0.0 0.0 - 0.4 x10E3/uL   Basophils Absolute 0.0 0.0 - 0.2 x10E3/uL   Immature Granulocytes 1 Not Estab. %   Immature Grans (Abs) 0.1 0.0 - 0.1 x10E3/uL  Comprehensive metabolic panel with GFR   Collection Time: 02/19/24 11:49 AM  Result Value Ref Range   Glucose 87 70 - 99 mg/dL   BUN 12 6 - 24 mg/dL   Creatinine, Ser 9.09 0.57 - 1.00 mg/dL   eGFR 81 >40 fO/fpw/8.26   BUN/Creatinine Ratio 13 9 - 23    Sodium 136 134 - 144 mmol/L   Potassium 4.6 3.5 - 5.2 mmol/L   Chloride 100 96 - 106 mmol/L   CO2 19 (L) 20 - 29 mmol/L   Calcium 9.0 8.7 - 10.2 mg/dL   Total Protein  6.2 6.0 - 8.5 g/dL   Albumin 4.3 3.9 - 4.9 g/dL   Globulin, Total 1.9 1.5 - 4.5 g/dL   Bilirubin Total 0.4 0.0 - 1.2 mg/dL   Alkaline Phosphatase 125 (H) 41 - 116 IU/L   AST 17 0 - 40 IU/L   ALT 18 0 - 32 IU/L  Lipid Panel w/o Chol/HDL Ratio   Collection Time: 02/19/24 11:49 AM  Result Value Ref Range   Cholesterol, Total 258 (H) 100 - 199 mg/dL   Triglycerides 714 (H) 0 - 149 mg/dL   HDL 74 >60 mg/dL   VLDL Cholesterol Cal 50 (H) 5 - 40 mg/dL   LDL Chol Calc (NIH) 865 (H) 0 - 99 mg/dL  TSH   Collection Time: 02/19/24 11:49 AM  Result Value Ref Range   TSH 2.120 0.450 - 4.500 uIU/mL  Hepatitis B surface antibody,quantitative   Collection Time: 02/19/24 11:49 AM  Result Value Ref Range   Hepatitis B Surf Ab Quant 65.5 Immunity>10 mIU/mL      Assessment & Plan:   Problem List Items Addressed This Visit       Other   Obesity, Class III, BMI 40-49.9 (morbid obesity) (HCC)   Will check sleep study- await results. Treat as needed.       Depression, major, recurrent   Her psychiatrist is no longer taking her insurance. Will refer to another psychiatrist. Call with any concerns.       Relevant Orders   Ambulatory referral to Psychiatry   Other Visit Diagnoses       Non-recurrent acute suppurative otitis media of right ear without spontaneous rupture of tympanic membrane    -  Primary   Will treat with augmentin . Call with any concerns. Continue to monitor.   Relevant Medications   amoxicillin -clavulanate (AUGMENTIN ) 875-125 MG tablet     COPD exacerbation (HCC)       Will treat with prednisone  and augmentin . Call with any concerns.   Relevant Medications   methylPREDNISolone  sodium succinate (SOLU-MEDROL ) 40 mg/mL injection 40 mg   predniSONE  (DELTASONE ) 10 MG tablet        Follow up  plan: Return in about 2 weeks (around 04/19/2024).      "

## 2024-04-05 NOTE — Assessment & Plan Note (Signed)
 Her psychiatrist is no longer taking her insurance. Will refer to another psychiatrist. Call with any concerns.

## 2024-04-05 NOTE — Assessment & Plan Note (Signed)
 Will check sleep study- await results. Treat as needed.

## 2024-04-22 ENCOUNTER — Ambulatory Visit: Admitting: Sleep Medicine

## 2024-04-22 ENCOUNTER — Ambulatory Visit: Admitting: Family Medicine

## 2024-04-29 ENCOUNTER — Ambulatory Visit: Admitting: Sleep Medicine

## 2024-04-29 ENCOUNTER — Encounter: Payer: Self-pay | Admitting: Sleep Medicine

## 2024-04-29 VITALS — BP 138/86 | HR 90 | Temp 97.8°F | Ht 61.0 in | Wt 251.4 lb

## 2024-04-29 DIAGNOSIS — Z6841 Body Mass Index (BMI) 40.0 and over, adult: Secondary | ICD-10-CM

## 2024-04-29 DIAGNOSIS — Z87891 Personal history of nicotine dependence: Secondary | ICD-10-CM | POA: Diagnosis not present

## 2024-04-29 DIAGNOSIS — G4700 Insomnia, unspecified: Secondary | ICD-10-CM | POA: Diagnosis not present

## 2024-04-29 DIAGNOSIS — F5104 Psychophysiologic insomnia: Secondary | ICD-10-CM

## 2024-04-29 DIAGNOSIS — G4710 Hypersomnia, unspecified: Secondary | ICD-10-CM | POA: Diagnosis not present

## 2024-04-29 DIAGNOSIS — G4733 Obstructive sleep apnea (adult) (pediatric): Secondary | ICD-10-CM

## 2024-04-29 NOTE — Progress Notes (Signed)
 "      Name:Sydney Valenzuela MRN: [id number] DOB: January 16, 1981   CHIEF COMPLAINT:  EXCESSIVE DAYTIME SLEEPINESS   HISTORY OF PRESENT ILLNESS: Sydney Valenzuela is a 44 y.o. w/ a h/o migraine headaches, morbid obesity, anxiety and depression who presents for c/o loud snoring and excessive daytime sleepiness which has been present for several years. Reports nocturnal awakenings due to nocturia and has difficulty falling back to sleep. Reports significant weight changes. Admits to dry mouth, night sweats and morning headaches. Denies RLS symptoms, dream enactment, cataplexy, hypnagogic or hypnapompic hallucinations. Denies a family history of sleep apnea. Reports drowsy driving. Drinks coffee occasionally, denies alcohol, tobacco or illicit drug use.   Bedtime 8-11 pm Sleep onset several hours Rise time 7-7:30 am   EPWORTH SLEEP SCORE 16    04/29/2024    1:18 PM  Results of the Epworth flowsheet  Sitting and reading 2  Watching TV 1  Sitting, inactive in a public place (e.g. a theatre or a meeting) 1  As a passenger in a car for an hour without a break 3  Lying down to rest in the afternoon when circumstances permit 3  Sitting and talking to someone 1  Sitting quietly after a lunch without alcohol 3  In a car, while stopped for a few minutes in traffic 2  Total score 16    PAST MEDICAL HISTORY :   has a past medical history of Allergy, Anxiety, Arthritis, Family history of adverse reaction to anesthesia, Hiatal hernia, and Hypertension.  has a past surgical history that includes Nasal sinus surgery; Tonsillectomy; Esophagogastroduodenoscopy (egd) with propofol  (N/A, 11/04/2016); LEEP; and Cholecystectomy (N/A, 11/21/2016). Prior to Admission medications  Medication Sig Start Date End Date Taking? Authorizing Provider  acetaminophen  (TYLENOL ) 500 MG tablet Take 500-1,000 mg by mouth daily as needed for moderate pain or headache.   Yes [provider]  albuterol  (PROVENTIL ) (2.5 MG/3ML)  0.083% nebulizer solution Take 3 mLs (2.5 mg total) by nebulization every 6 (six) hours as needed for wheezing or shortness of breath. 06/01/21  Yes Johnson, Megan P, DO  albuterol  (VENTOLIN  HFA) 108 (90 Base) MCG/ACT inhaler Inhale 2 puffs into the lungs every 6 (six) hours as needed for wheezing or shortness of breath. 02/19/24  Yes Johnson, Megan P, DO  ALPRAZolam (XANAX) 0.5 MG tablet Take 0.5 mg by mouth 2 (two) times daily as needed. 03/08/21  Yes [provider]  baclofen  (LIORESAL ) 10 MG tablet TAKE 1 TABLET BY MOUTH EVERY NIGHT AT BEDTIME AS NEEDED FOR MUSCLE SPASMS 01/29/24  Yes Johnson, Megan P, DO  cyanocobalamin  (VITAMIN B12) 1000 MCG tablet Take 1,000 mcg by mouth daily.   Yes [provider]  desvenlafaxine (PRISTIQ) 50 MG 24 hr tablet Take 50 mg by mouth daily. 03/01/24 08/28/24 Yes [provider]  diphenhydrAMINE  HCl (BENADRYL  ALLERGY PO) Take by mouth 2 (two) times daily. Patient taking differently: Take by mouth 2 (two) times daily. AS NEEDED   Yes [provider]  docusate sodium (COLACE) 100 MG capsule Take 100 mg by mouth 2 (two) times daily as needed. Patient taking differently: Take 100 mg by mouth 2 (two) times daily as needed. AS NEEDED 08/01/23  Yes [provider]  glucosamine-chondroitin 500-400 MG tablet Take 1 tablet by mouth 3 (three) times daily.   Yes [provider]  lamoTRIgine (LAMICTAL) 150 MG tablet Take 300 mg by mouth daily. 03/01/24 08/28/24 Yes [provider]  lidocaine  (XYLOCAINE ) 2 % solution Use as directed  15 mLs in the mouth or throat every 4 (four) hours as needed for mouth pain. 09/10/21  Yes Johnson, Megan P, DO  magnesium gluconate (MAGONATE) 500 MG tablet Take 500 mg by mouth daily.   Yes [provider]  Multiple Vitamins-Minerals (MULTIVITAMIN WITH MINERALS) tablet Take 1 tablet by mouth daily.   Yes [provider]  ondansetron  (ZOFRAN -ODT) 4 MG disintegrating tablet Take 4  mg by mouth every 8 (eight) hours as needed for vomiting. 08/01/23  Yes [provider]  pantoprazole  (PROTONIX ) 40 MG tablet Take 1 tablet (40 mg total) by mouth 2 (two) times daily. 02/19/24  Yes Johnson, Megan P, DO  prazosin (MINIPRESS) 2 MG capsule Take 2 mg by mouth 3 (three) times daily. 06/20/22  Yes [provider]  pregabalin  (LYRICA ) 100 MG capsule TAKE TAKE 1 CAPSULE BY MOUTH 2 TIMES A DAY IN THE MORNING AND IN THE EVENING AND TAKE 2 CAPSULES BY MOUTH AT BEDTIME .TAKE MORNING AND EVENING WITH 50MG  CAPSULE 02/19/24  Yes Johnson, Megan P, DO  pregabalin  (LYRICA ) 50 MG capsule Take 1 capsule (50 mg total) by mouth 2 (two) times daily. With her 100mg  for 150mg  total 02/19/24  Yes Johnson, Megan P, DO  Rimegepant Sulfate (NURTEC) 75 MG TBDP Take 1 tablet (75 mg total) by mouth daily as needed. 10/29/23  Yes Johnson, Megan P, DO  SUMAtriptan  (IMITREX ) 100 MG tablet Take 100 mg by mouth as needed. 04/26/24  Yes [provider]  tiZANidine  (ZANAFLEX ) 4 MG tablet Take 1 tablet (4 mg total) by mouth every 6 (six) hours as needed for muscle spasms. Take 1 tablet (4 mg total) by mouth every 6 (six) hours as needed for muscle spasms. OK to take 2 at bedtime 02/19/24  Yes Johnson, Megan P, DO  GARLIC 1500 PO Take by mouth. Patient not taking: Reported on 04/29/2024    [provider]  Ginger, Zingiber officinalis, (GINGER ROOT) 550 MG CAPS Take 550 mg by mouth 2 (two) times daily. TID in the A.M. TID in the P.M. Patient not taking: Reported on 04/29/2024    [provider]  Turmeric (QC TUMERIC COMPLEX PO) Take 1,500 mg by mouth at bedtime. Patient not taking: Reported on 04/29/2024    [provider]   Allergies[1]  FAMILY HISTORY:  family history includes Allergies in her maternal grandmother and mother; Alzheimer's disease in her maternal grandmother; Anxiety disorder in her daughter; COPD in her maternal grandmother; Cancer in her maternal uncle;  Diabetes in her maternal grandfather and maternal uncle; Heart disease in her maternal grandfather and maternal uncle; Hyperlipidemia in her mother; Hypertension in her mother and son. SOCIAL HISTORY:  reports that she quit smoking about 3 years ago. Her smoking use included cigarettes. She started smoking about 16 years ago. She has a 6.5 pack-year smoking history. She has never used smokeless tobacco. She reports current alcohol use. She reports that she does not use drugs.   Review of Systems:  Gen:  Denies  fever, sweats, chills weight loss  HEENT: Denies blurred vision, double vision, ear pain, eye pain, hearing loss, nose bleeds, sore throat Cardiac:  No dizziness, chest pain or heaviness, chest tightness,edema, No JVD Resp:   No cough, -sputum production, -shortness of breath,-wheezing, -hemoptysis,  Gi: Denies swallowing difficulty, stomach pain, nausea or vomiting, diarrhea, constipation, bowel incontinence Gu:  Denies bladder incontinence, burning urine Ext:   Denies Joint pain, stiffness or swelling Skin: Denies  skin rash, easy bruising or bleeding or  hives Endoc:  Denies polyuria, polydipsia , polyphagia or weight change Psych:   Denies depression, insomnia or hallucinations  Other:  All other systems negative  VITAL SIGNS: BP 138/86   Pulse 90   Temp 97.8 F (36.6 C)   Ht 5' 1 (1.549 m)   Wt 251 lb 6.4 oz (114 kg)   SpO2 97%   BMI 47.50 kg/m    Physical Examination:   General Appearance: No distress  EYES PERRLA, EOM intact.   NECK Supple, No JVD Pulmonary: normal breath sounds, No wheezing.  CardiovascularNormal S1,S2.  No m/r/g.   Abdomen: Benign, Soft, non-tender. Skin:   warm, no rashes, no ecchymosis  Extremities: normal, no cyanosis, clubbing. Neuro:without focal findings,  speech normal  PSYCHIATRIC: Mood, affect within normal limits.   ASSESSMENT AND PLAN  OSA I suspect that OSA is likely present due to clinical presentation. Discussed the  consequences of untreated sleep apnea. Advised not to drive drowsy for safety of patient and others. Will complete further evaluation with a home sleep study and follow up to review results.    Morbid obesity Counseled patient on diet and lifestyle modification.  Insomnia Counseled patient on stimulus control and improving sleep hygiene practice.s    MEDICATION ADJUSTMENTS/LABS AND TESTS ORDERED: Recommend Sleep Study   Patient  satisfied with Plan of action and management. All questions answered  Follow up to review HST results and treatment plan.   I spent a total of 34 minutes reviewing chart data, face-to-face evaluation with the patient, counseling and coordination of care as detailed above.    Lamont Glasscock, M.D.  Sleep Medicine [facility]           [1]  Allergies Allergen Reactions   Codeine Itching    Initial dose is the worst, but becomes more tolerable with more doses, severe itching and tongue swelling   Hydrocodone  Itching   Oxycodone  Other (See Comments)    oxycodone    "

## 2024-04-29 NOTE — Patient Instructions (Signed)
 Sydney Valenzuela

## 2024-12-09 ENCOUNTER — Ambulatory Visit
